# Patient Record
Sex: Male | Born: 1955
Health system: Southern US, Community
[De-identification: ages and names within clinical notes are randomized; demographics above are authoritative.]

## PROBLEM LIST (undated history)

## (undated) DIAGNOSIS — J189 Pneumonia, unspecified organism: Secondary | ICD-10-CM

## (undated) DIAGNOSIS — F32A Depression, unspecified: Secondary | ICD-10-CM

## (undated) DIAGNOSIS — E785 Hyperlipidemia, unspecified: Secondary | ICD-10-CM

## (undated) DIAGNOSIS — C61 Malignant neoplasm of prostate: Secondary | ICD-10-CM

## (undated) DIAGNOSIS — K219 Gastro-esophageal reflux disease without esophagitis: Secondary | ICD-10-CM

## (undated) DIAGNOSIS — K317 Polyp of stomach and duodenum: Secondary | ICD-10-CM

## (undated) DIAGNOSIS — K7460 Unspecified cirrhosis of liver: Secondary | ICD-10-CM

## (undated) DIAGNOSIS — K754 Autoimmune hepatitis: Secondary | ICD-10-CM

## (undated) DIAGNOSIS — K8020 Calculus of gallbladder without cholecystitis without obstruction: Secondary | ICD-10-CM

## (undated) DIAGNOSIS — F329 Major depressive disorder, single episode, unspecified: Secondary | ICD-10-CM

## (undated) DIAGNOSIS — Z973 Presence of spectacles and contact lenses: Secondary | ICD-10-CM

## (undated) DIAGNOSIS — Z8719 Personal history of other diseases of the digestive system: Secondary | ICD-10-CM

## (undated) DIAGNOSIS — I319 Disease of pericardium, unspecified: Secondary | ICD-10-CM

## (undated) DIAGNOSIS — K635 Polyp of colon: Secondary | ICD-10-CM

## (undated) HISTORY — PX: MOUTH SURGERY: SHX715

## (undated) HISTORY — DX: Autoimmune hepatitis: K75.4

## (undated) HISTORY — PX: TONSILLECTOMY AND ADENOIDECTOMY: SUR1326

## (undated) HISTORY — DX: Polyp of colon: K63.5

## (undated) HISTORY — DX: Unspecified cirrhosis of liver: K74.60

## (undated) HISTORY — PX: VASECTOMY: SHX75

## (undated) HISTORY — DX: Hyperlipidemia, unspecified: E78.5

## (undated) HISTORY — PX: SHOULDER ARTHROSCOPY: SHX128

## (undated) HISTORY — DX: Calculus of gallbladder without cholecystitis without obstruction: K80.20

## (undated) HISTORY — DX: Gastro-esophageal reflux disease without esophagitis: K21.9

## (undated) HISTORY — PX: OTHER SURGICAL HISTORY: SHX169

## (undated) HISTORY — DX: Polyp of stomach and duodenum: K31.7

## (undated) HISTORY — PX: POLYPECTOMY: SHX149

---
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
## 2005-12-07 ENCOUNTER — Encounter: Admission: RE | Admit: 2005-12-07 | Discharge: 2005-12-07 | Payer: Self-pay | Admitting: Family Medicine

## 2005-12-07 IMAGING — US US AORTA
1 series · 14 of 25 positions shown · non-contrast
Comparison: None.

CLINICAL DATA: Upper abdominal pain on physical exam.  Question AAA.
AORTA/RETROPERITONEAL ULTRASOUND:
TECHNIQUE: Complete retroperitoneal ultrasound examination was performed, including evaluation of the abdominal aorta, IVC, common iliac vessel origins, and kidneys.

[Series 1: unknown · 0.24mm/px · 14 of 39 slices shown]
[im 1/39]
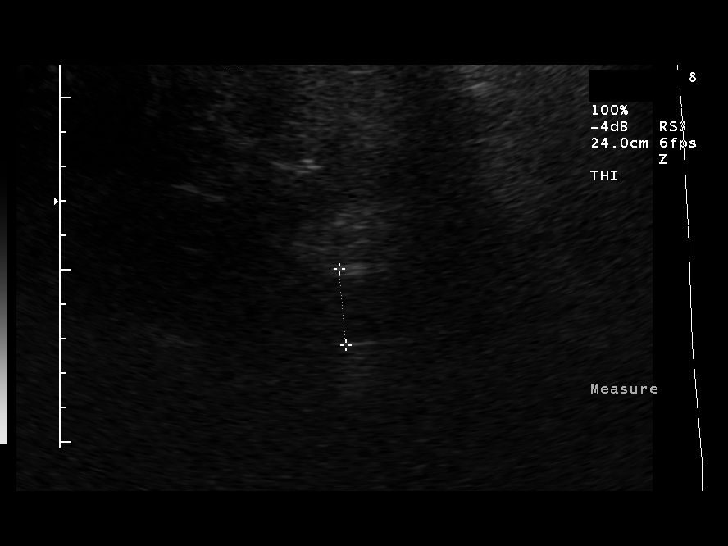
[im 4/39]
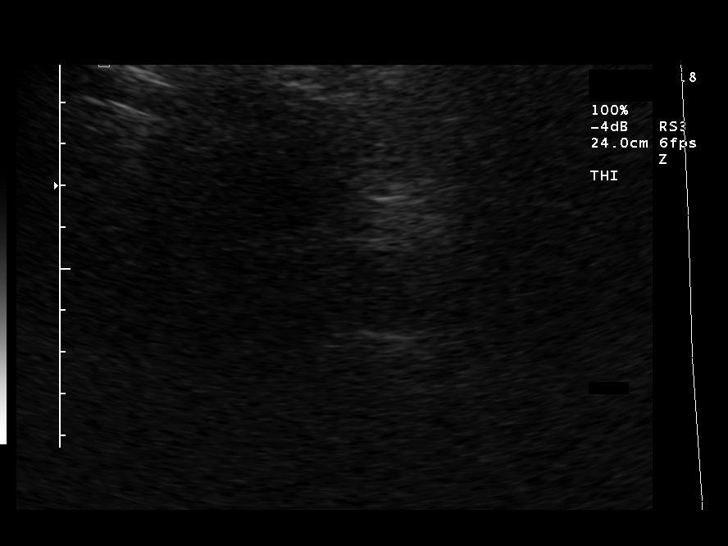
[im 7/39]
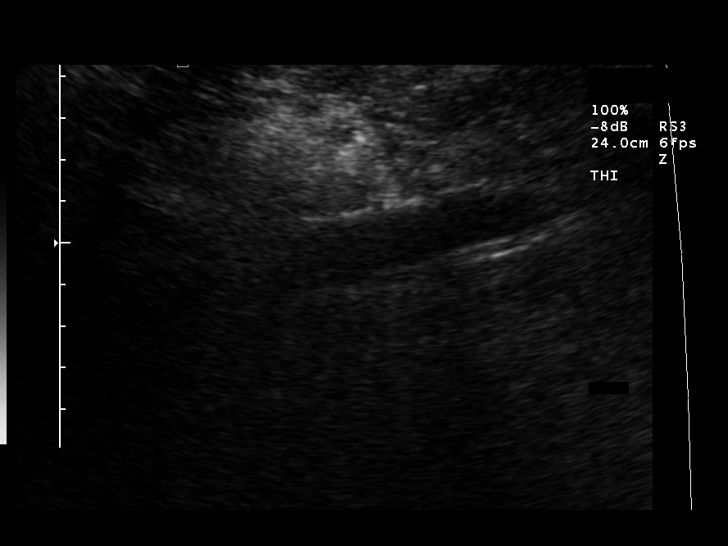
[im 10/39]
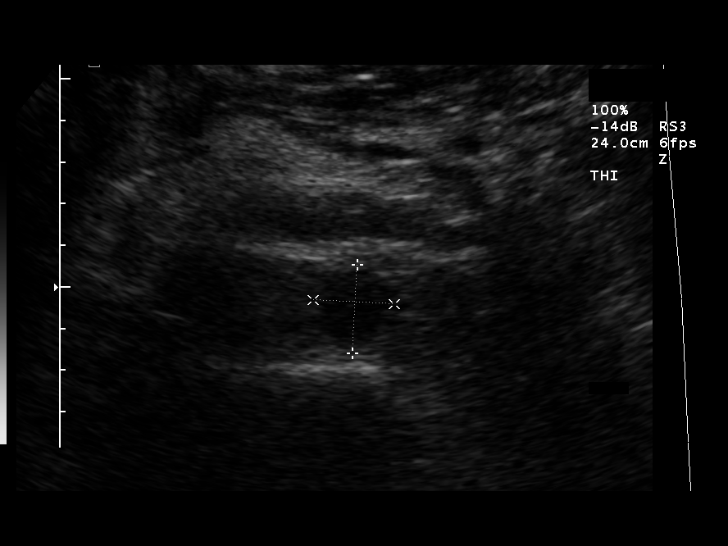
[im 13/39]
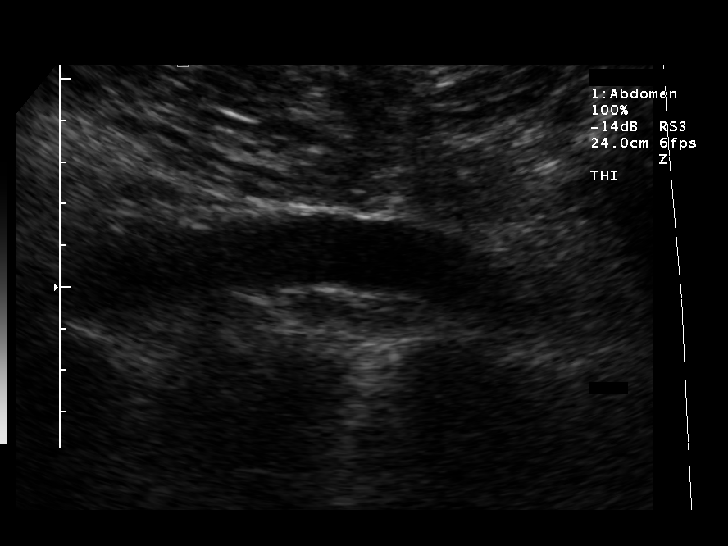
[im 15/39]
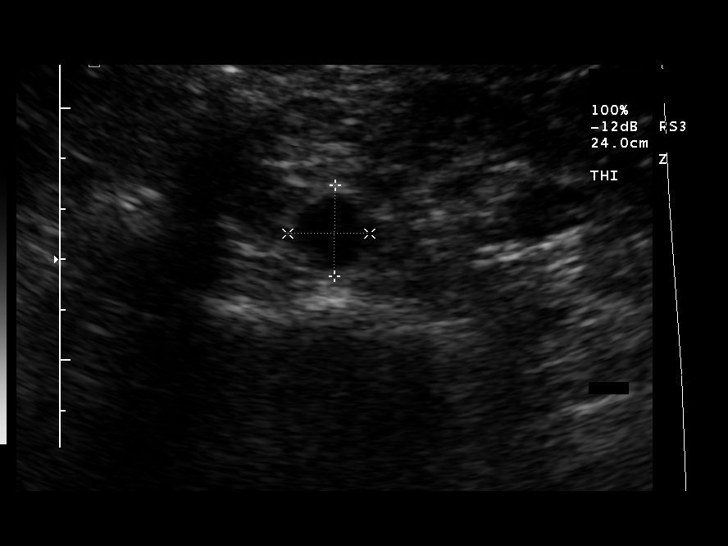
[im 18/39]
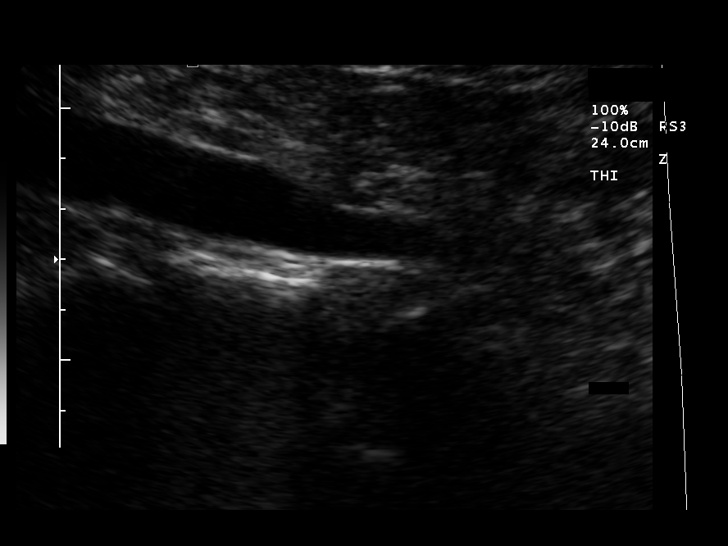
[im 21/39]
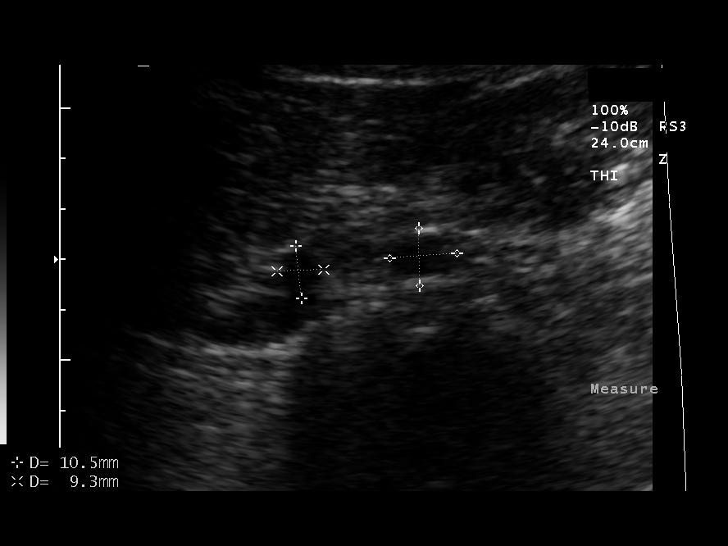
[im 24/39]
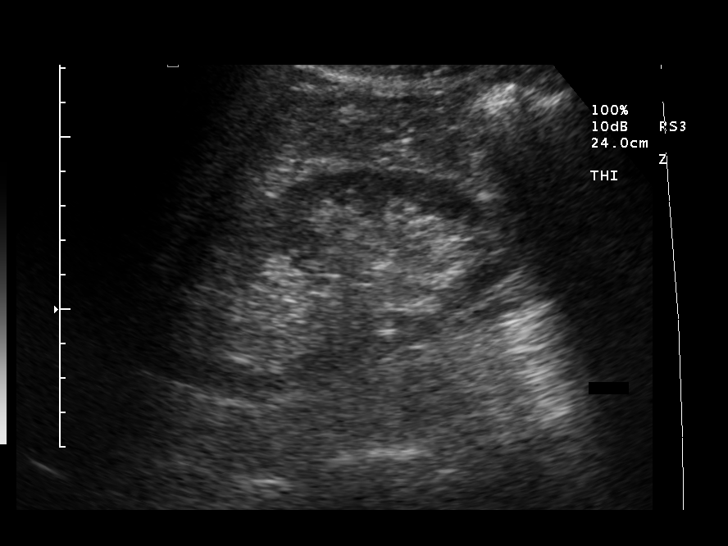
[im 26/39]
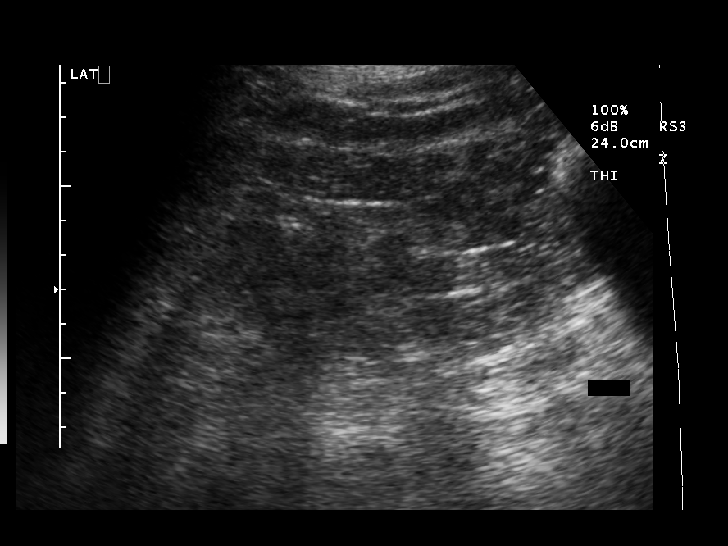
[im 29/39]
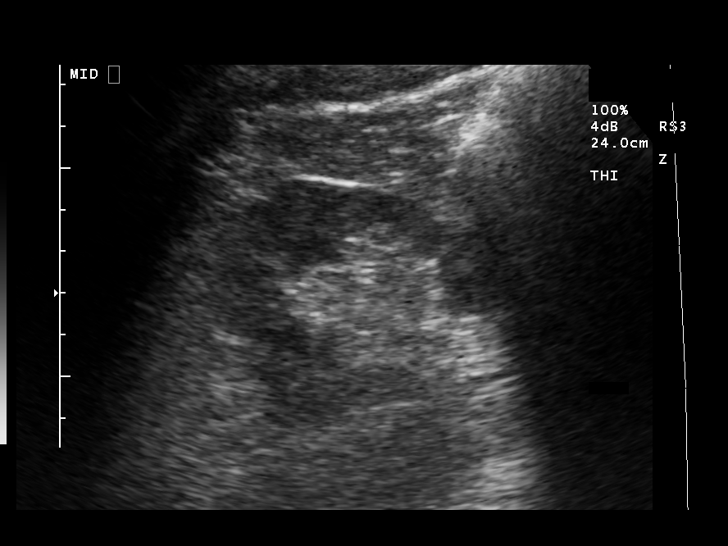
[im 32/39]
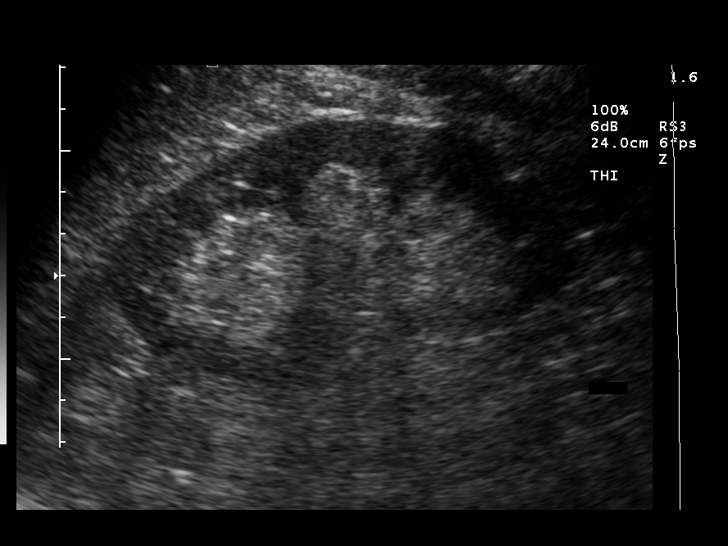
[im 35/39]
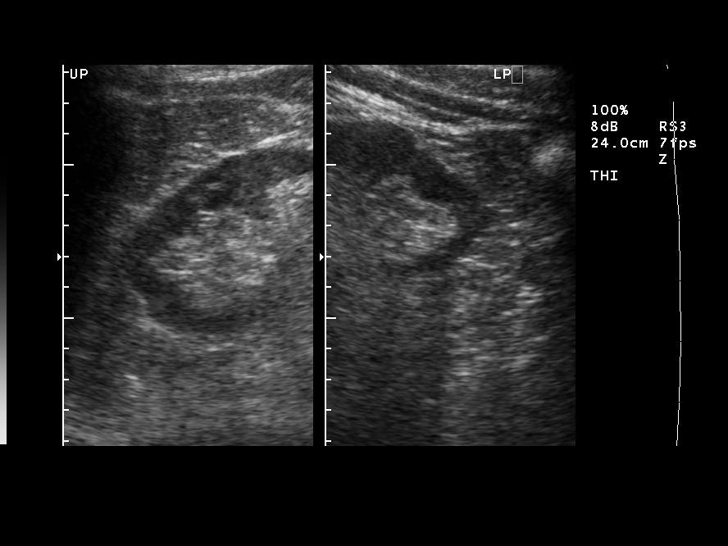
[im 39/39]
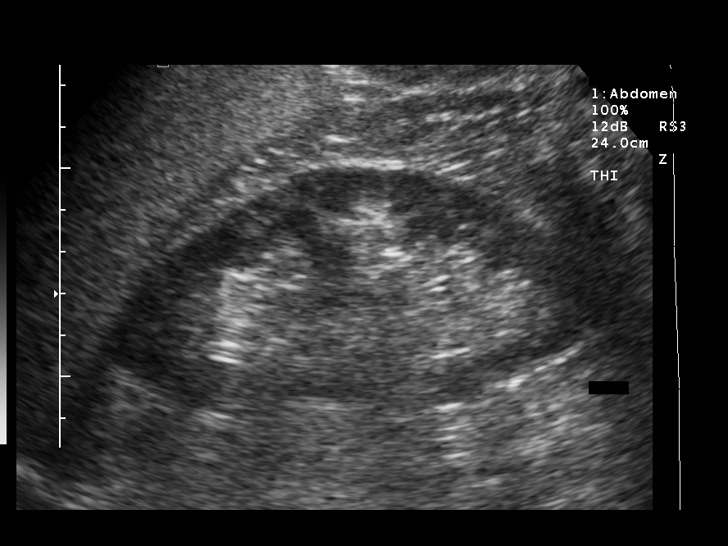

[14 of 25 positions shown; findings below may reference images not displayed]

FINDINGS: Study is limited due to overlying gastrointestinal gas.  Maximum AP diameter of visualized abdominal aorta is 2.2 cm AP by 2.2 cm wide with no aneurysm seen.  Bilateral common iliac arteries measure normally with the right 1 cm, left 1.1 cm.  Inferior vena cava and bilateral kidneys are sonographically normal with the right kidney measuring 12.1 cm long and the left kidney 11 cm long.
IMPRESSION: 1.  Slight limitations due to overlying gastrointestinal gas.
2.  No evidence for abdominal aortic aneurysm.
3.  Otherwise negative.

## 2008-01-19 ENCOUNTER — Ambulatory Visit: Payer: Self-pay | Admitting: Gastroenterology

## 2008-02-03 ENCOUNTER — Encounter: Payer: Self-pay | Admitting: Gastroenterology

## 2008-02-03 ENCOUNTER — Ambulatory Visit: Payer: Self-pay | Admitting: Gastroenterology

## 2008-02-03 DIAGNOSIS — D126 Benign neoplasm of colon, unspecified: Secondary | ICD-10-CM | POA: Insufficient documentation

## 2008-02-23 ENCOUNTER — Ambulatory Visit: Payer: Self-pay | Admitting: Family Medicine

## 2010-12-31 DIAGNOSIS — J189 Pneumonia, unspecified organism: Secondary | ICD-10-CM

## 2010-12-31 HISTORY — DX: Pneumonia, unspecified organism: J18.9

## 2010-12-31 HISTORY — PX: SHOULDER ARTHROSCOPY: SHX128

## 2012-12-30 ENCOUNTER — Other Ambulatory Visit (HOSPITAL_COMMUNITY): Payer: Self-pay | Admitting: Psychiatry

## 2013-01-19 ENCOUNTER — Encounter: Payer: Self-pay | Admitting: Gastroenterology

## 2013-03-04 ENCOUNTER — Other Ambulatory Visit (HOSPITAL_COMMUNITY): Payer: Self-pay | Admitting: Psychiatry

## 2013-12-28 ENCOUNTER — Other Ambulatory Visit: Payer: Self-pay | Admitting: Family Medicine

## 2013-12-28 DIAGNOSIS — R7989 Other specified abnormal findings of blood chemistry: Secondary | ICD-10-CM

## 2014-01-04 ENCOUNTER — Ambulatory Visit
Admission: RE | Admit: 2014-01-04 | Discharge: 2014-01-04 | Disposition: A | Discharge status: Home / Self Care | Payer: BC Managed Care – PPO | Source: Ambulatory Visit | Attending: Family Medicine | Admitting: Family Medicine

## 2014-01-04 DIAGNOSIS — R7989 Other specified abnormal findings of blood chemistry: Secondary | ICD-10-CM

## 2014-01-04 DIAGNOSIS — R945 Abnormal results of liver function studies: Principal | ICD-10-CM

## 2014-01-04 IMAGING — US US ABDOMEN COMPLETE
1 series · 14 of 25 positions shown · non-contrast
Comparison: Ultrasound aorta [DATE].

CLINICAL DATA: Elevated liver function tests.

EXAM:
ULTRASOUND ABDOMEN COMPLETE

[Series 1: us abdomen complete · 0.41mm/px · 14 of 84 slices shown]
[im 1/84]
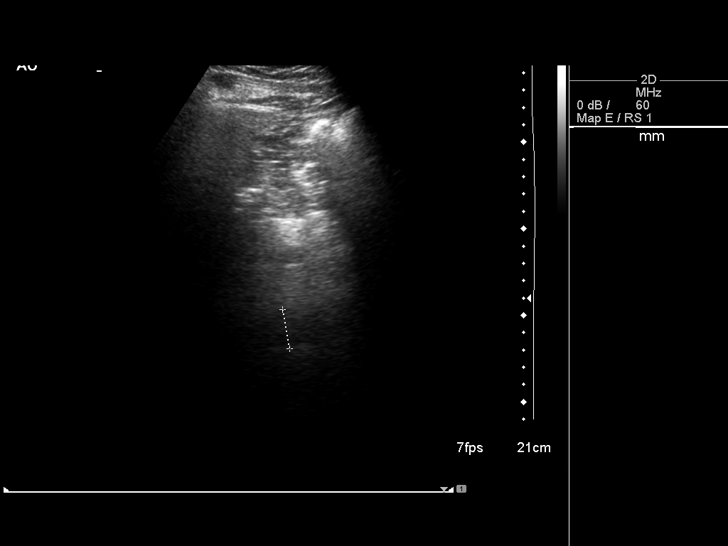
[im 7/84]
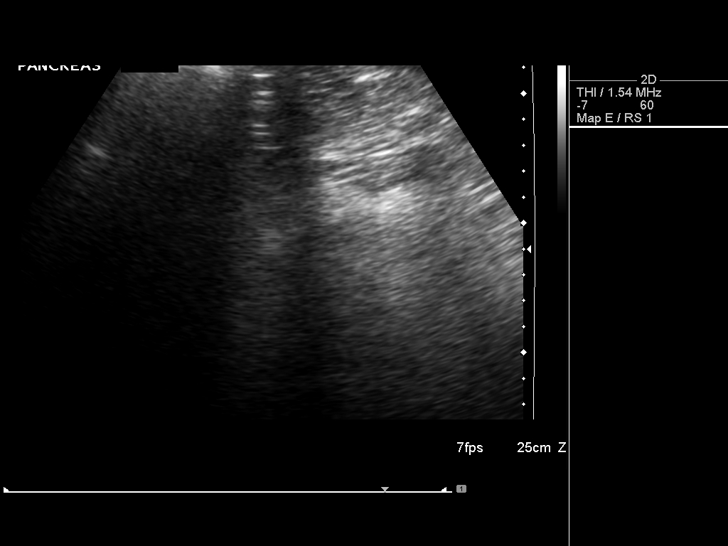
[im 14/84]
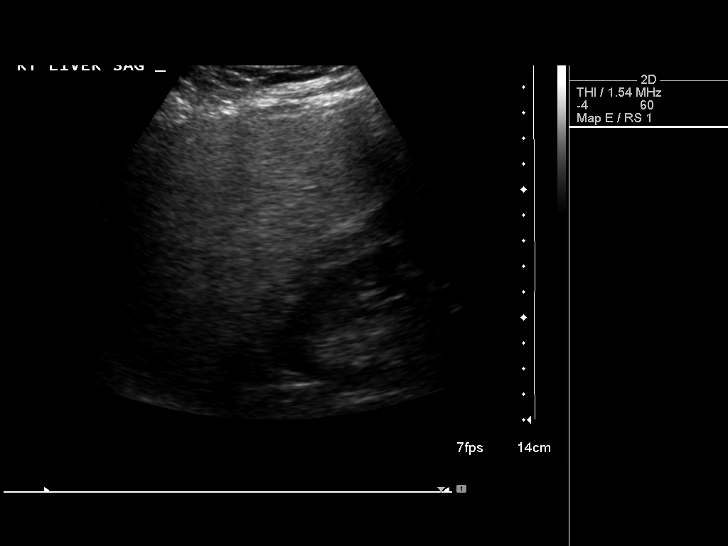
[im 21/84]
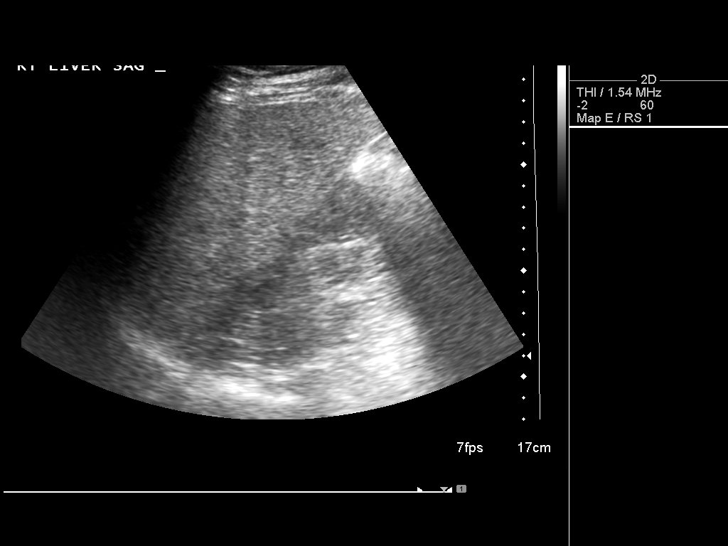
[im 28/84]
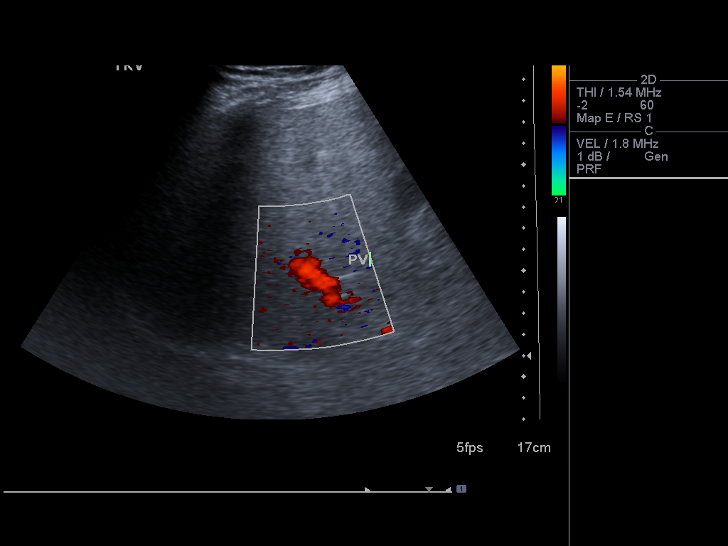
[im 32/84]
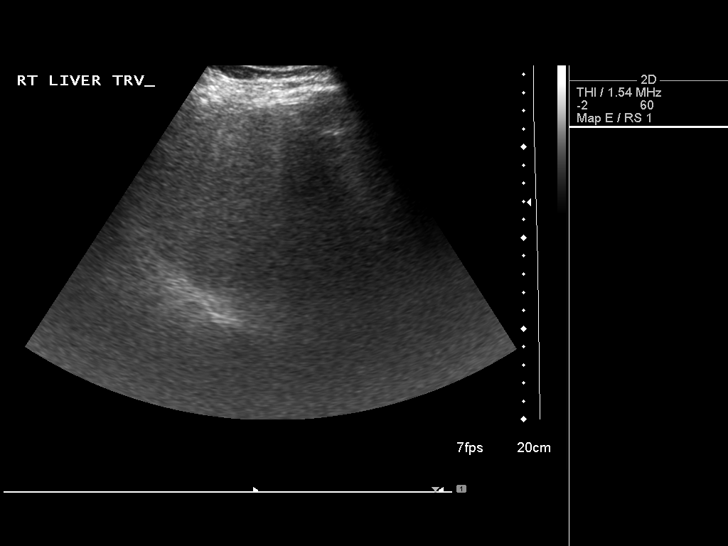
[im 39/84]
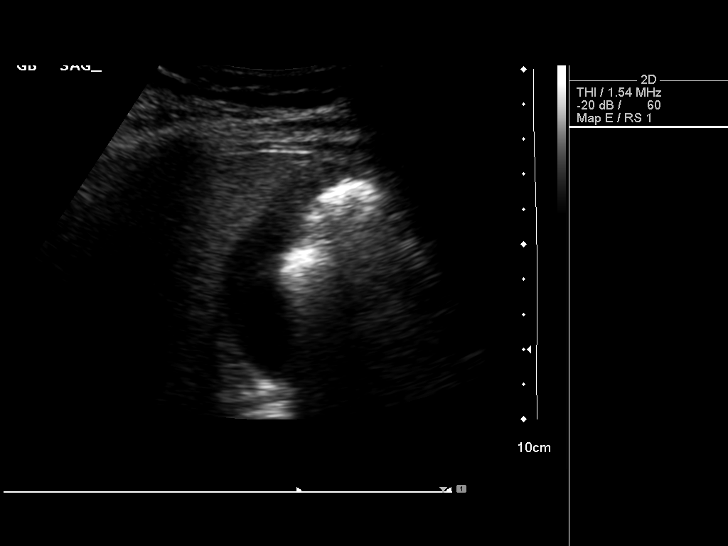
[im 45/84]
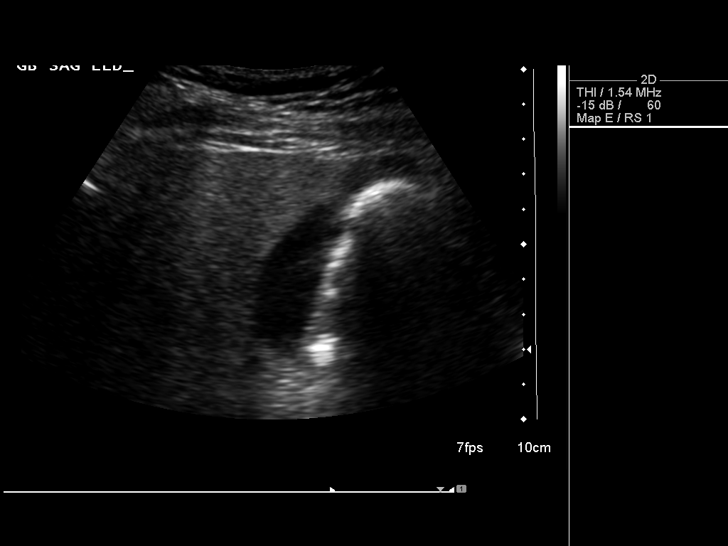
[im 52/84]
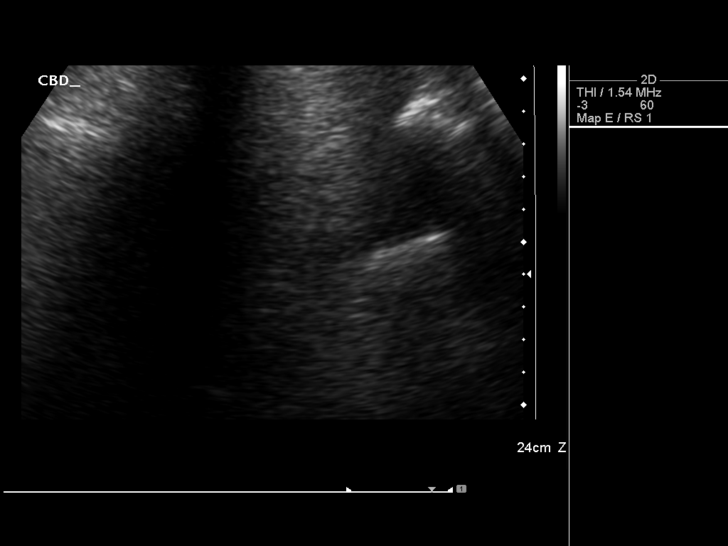
[im 56/84]
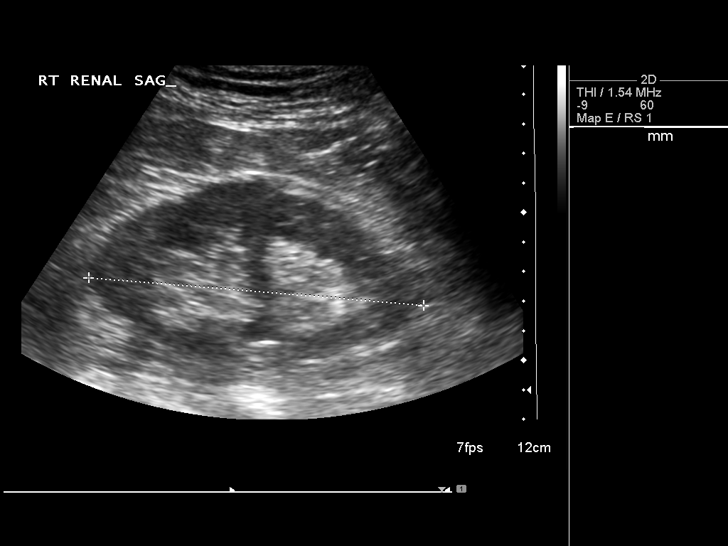
[im 63/84]
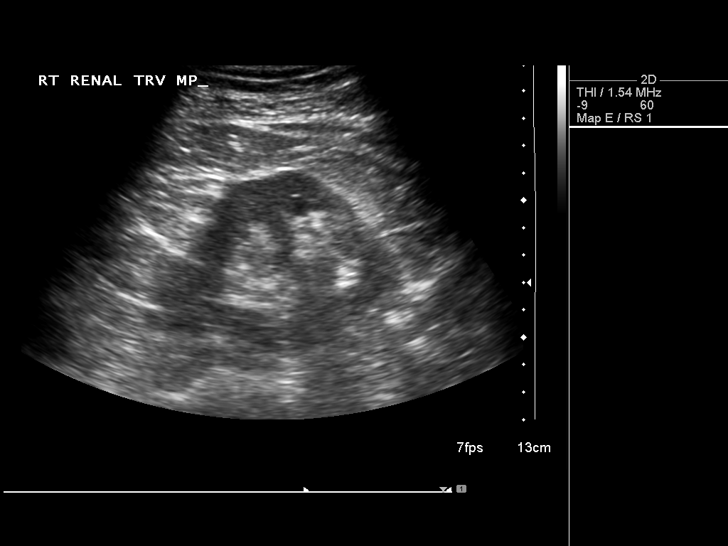
[im 70/84]
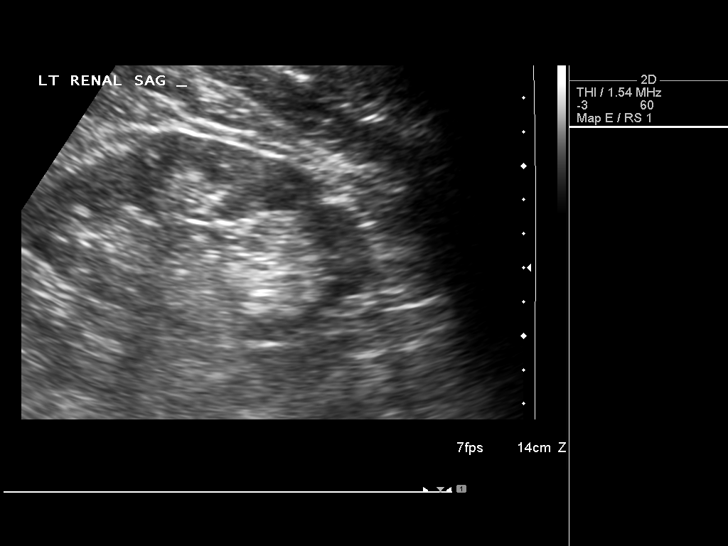
[im 77/84]
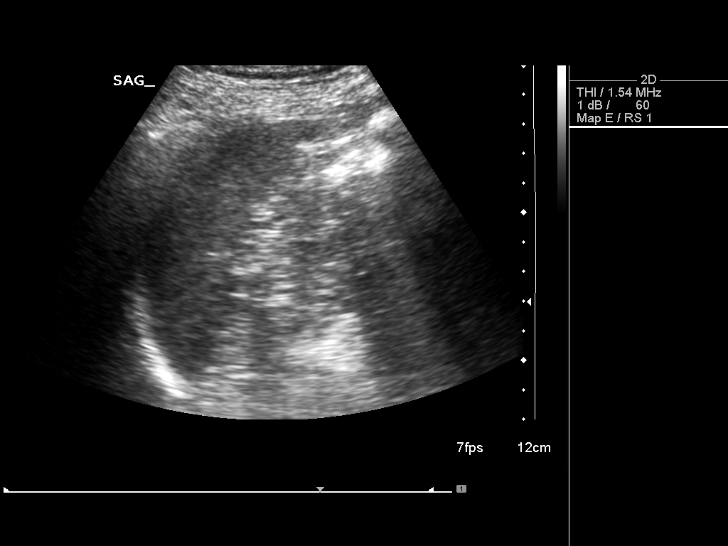
[im 84/84]
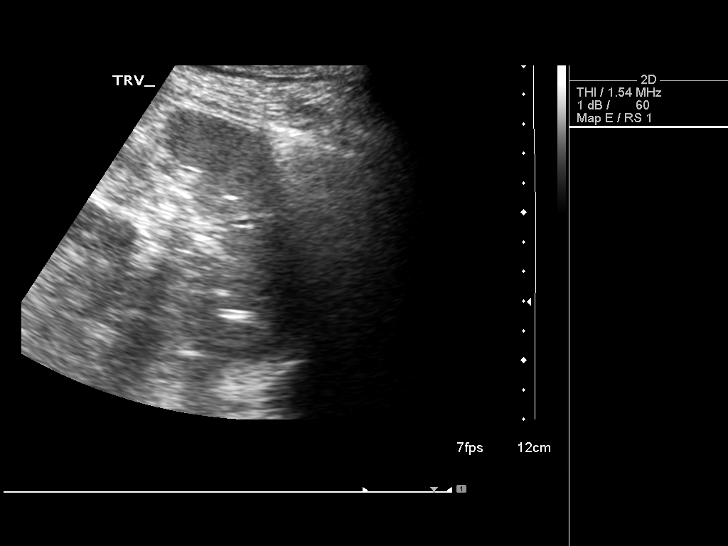

[14 of 25 positions shown; findings below may reference images not displayed]

FINDINGS: Gallbladder:

No gallstones or wall thickening visualized. No sonographic Murphy
sign noted.

Common bile duct:

Diameter: 3 mm, within normal limits.

Liver:

Diffusely increased in echogenicity.  No definite focal lesion.

IVC:

Visualization is limited by bowel gas.

Pancreas:

Obscured by bowel gas.

Spleen:

Measures 4.6 cm, negative.

Right Kidney:

Length: 11.4 cm. There may be mild cortical thinning. Parenchymal
echogenicity is within normal limits. A 1.2 cm anechoic lesion with
increased through transmission is consistent with a cyst, in the
interpolar region.

Left Kidney:

Length: 11.5 cm. Cortical thinning. Parenchymal echogenicity is
within normal limits. No hydronephrosis. No focal lesion.

Abdominal aorta:

No aneurysm visualized.

Other findings:

None.
IMPRESSION: 1. Hepatic steatosis.
2. Mild bilateral renal cortical thinning.

## 2014-11-08 ENCOUNTER — Ambulatory Visit
Admission: RE | Admit: 2014-11-08 | Discharge: 2014-11-08 | Disposition: A | Discharge status: Home / Self Care | Payer: BC Managed Care – PPO | Source: Ambulatory Visit | Attending: Family Medicine | Admitting: Family Medicine

## 2014-11-08 ENCOUNTER — Other Ambulatory Visit: Payer: Self-pay | Admitting: Family Medicine

## 2014-11-08 DIAGNOSIS — R05 Cough: Secondary | ICD-10-CM

## 2014-11-08 DIAGNOSIS — R059 Cough, unspecified: Secondary | ICD-10-CM

## 2014-11-08 IMAGING — CR DG CHEST 2V
2 series · 2 of 2 positions shown · non-contrast
Comparison: None.

CLINICAL DATA: Three-week history of cough, shortness of breath,
and bilateral lower rib discomfort and mid chest pleuritic pain ; no
history of tobacco use

EXAM:
CHEST  2 VIEW

[w chest pa]
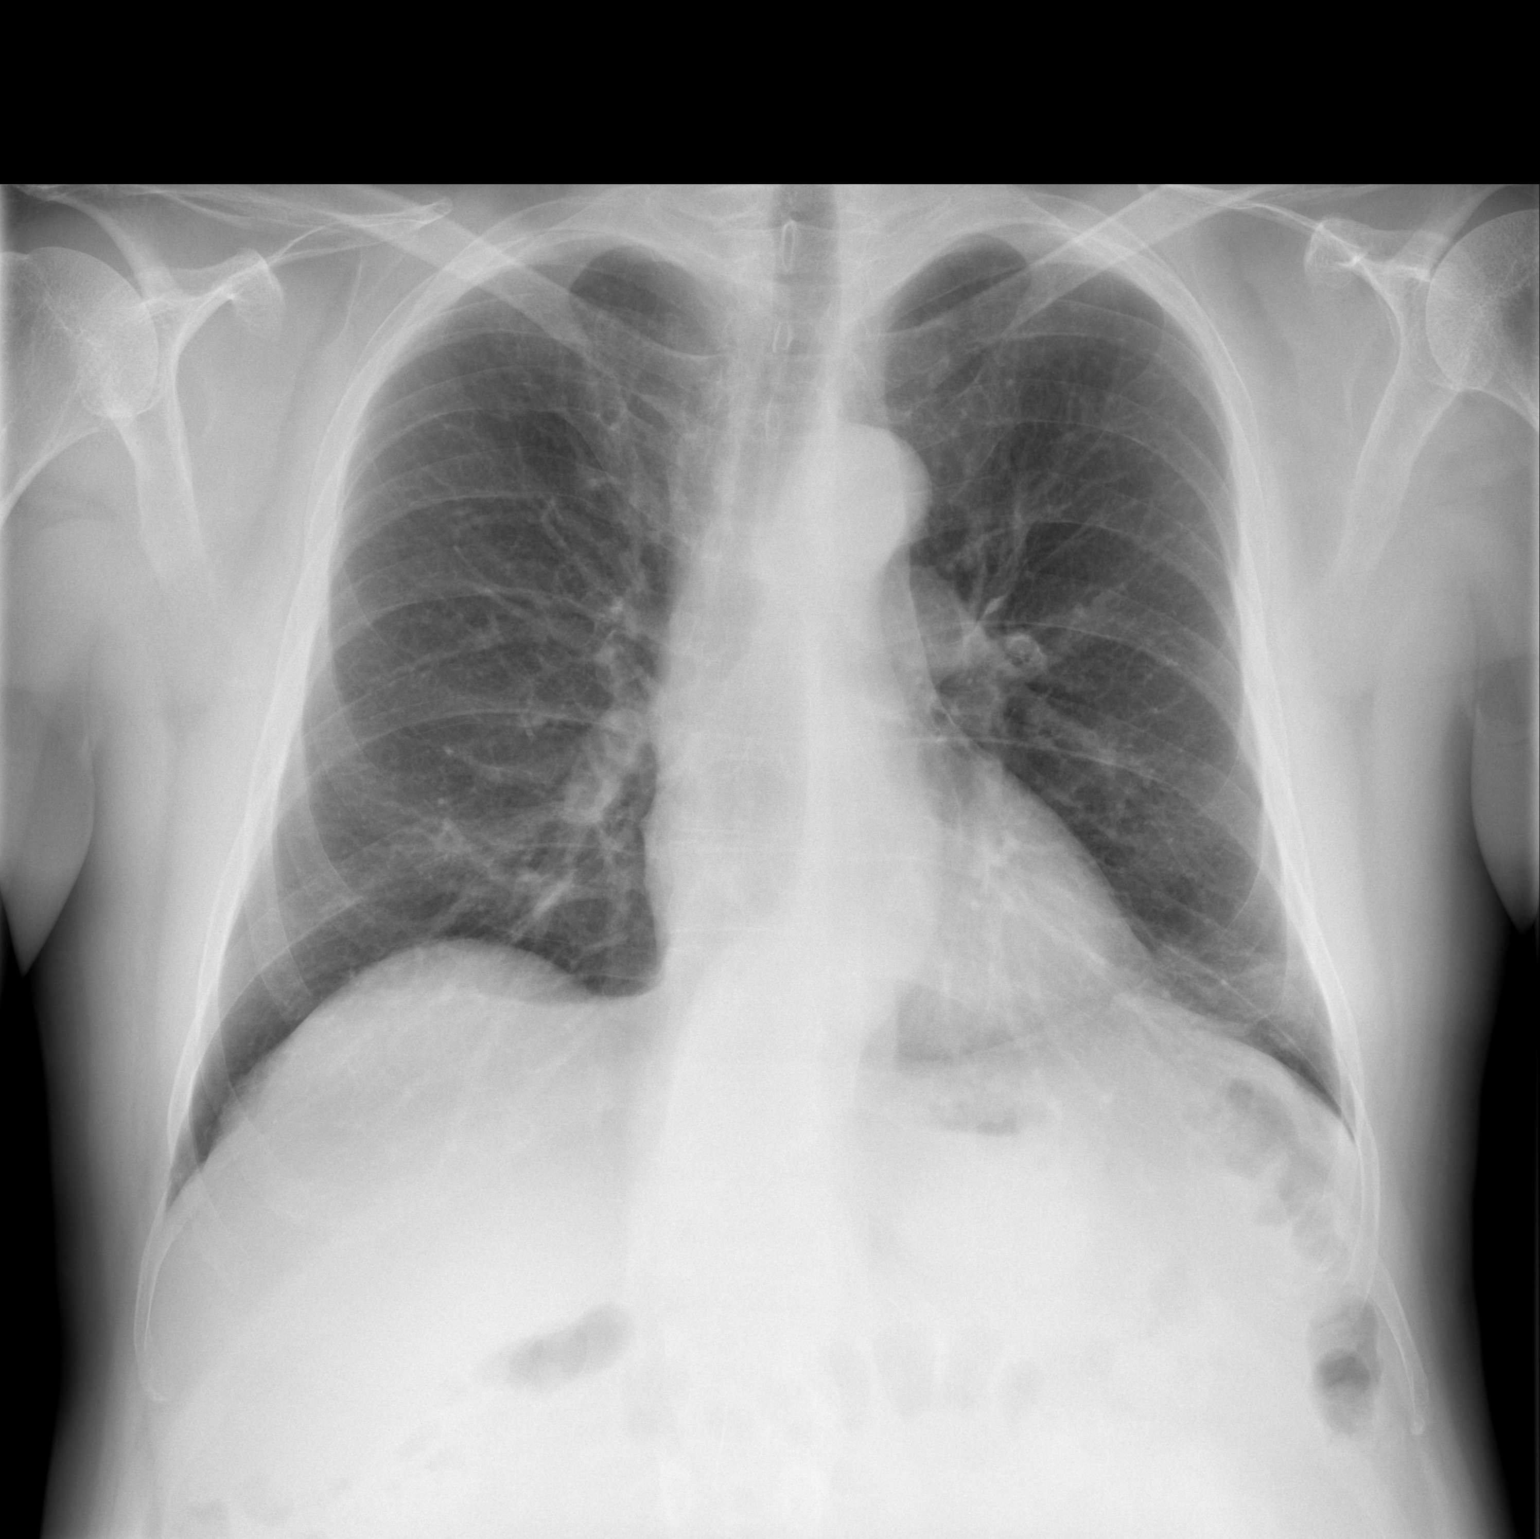

[w chest lat]
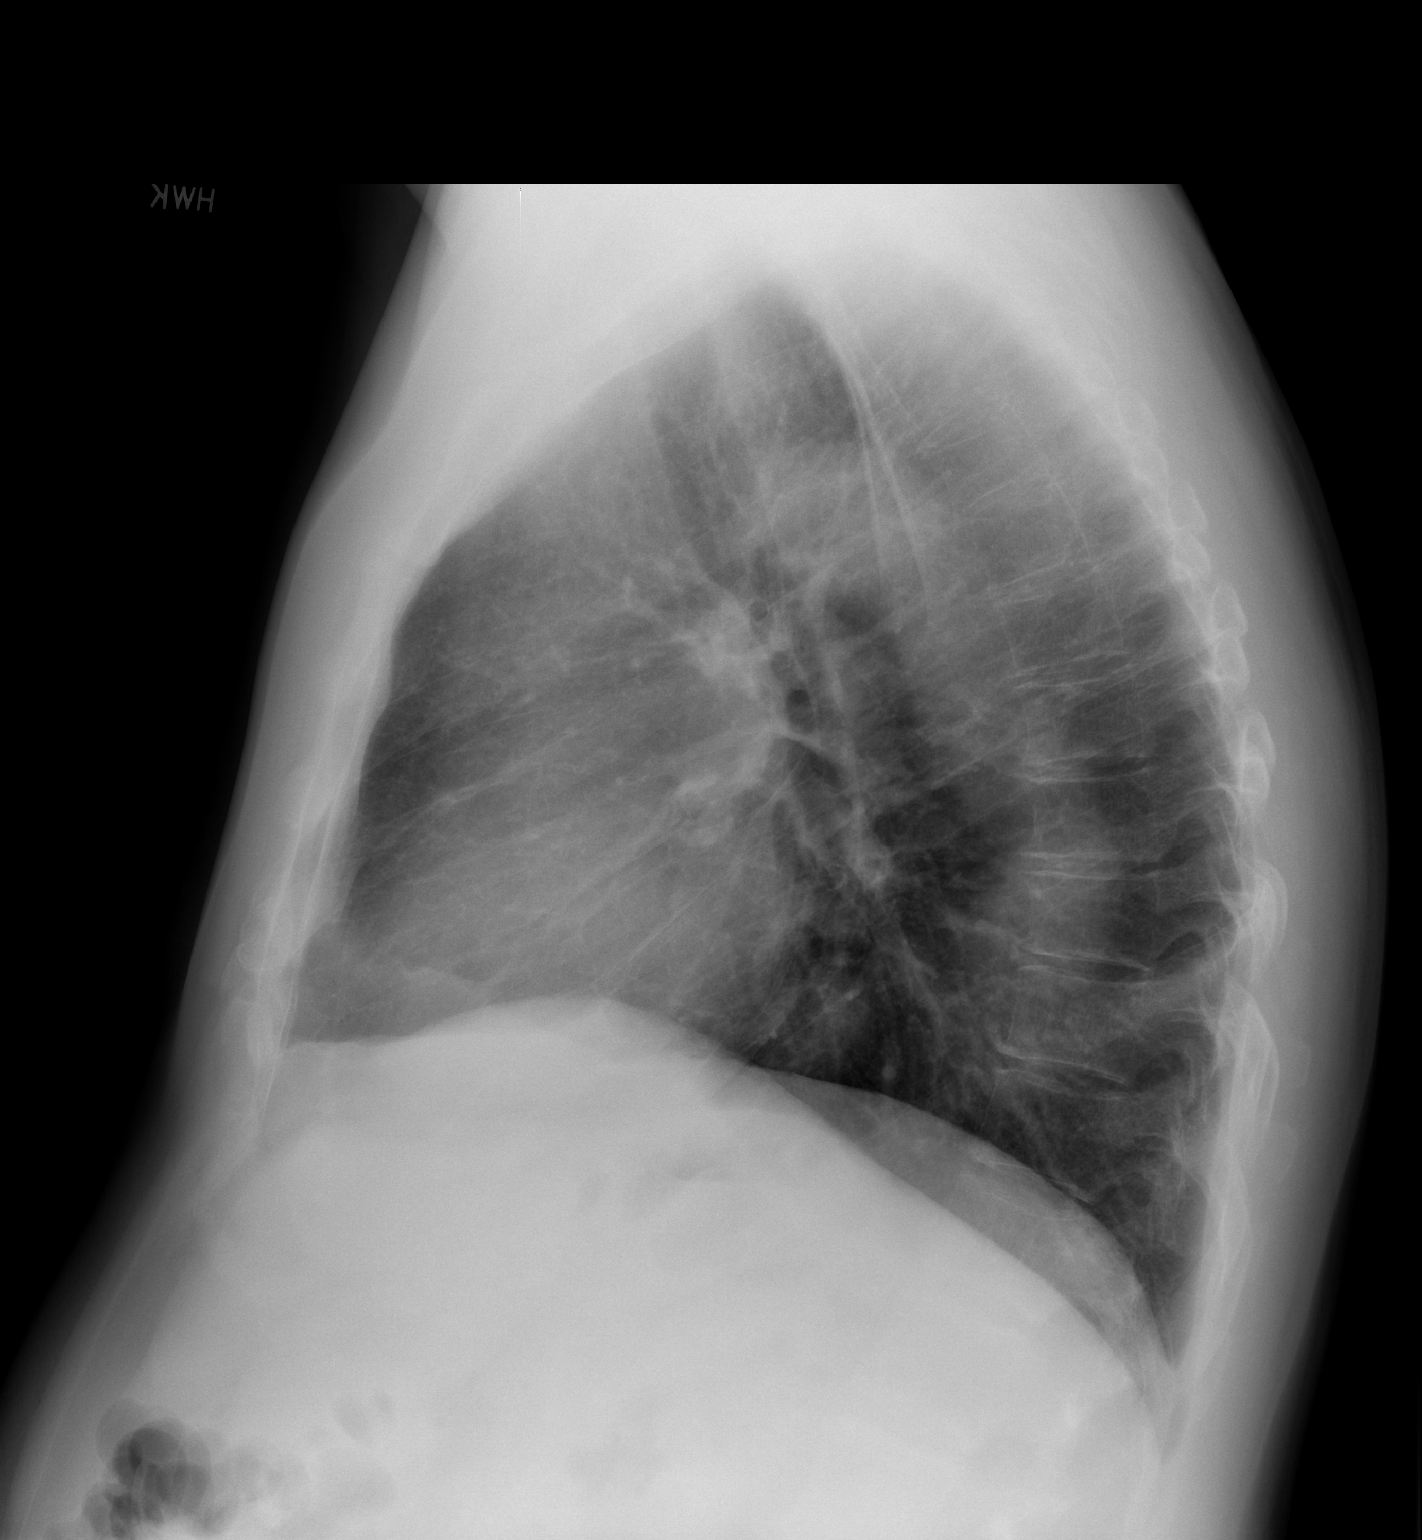

[2 of 2 positions shown; findings below may reference images not displayed]

FINDINGS: The lungs are reasonably well inflated. There are coarse lung
markings just above the left hemidiaphragm. There is no alveolar
pneumonia. The heart and pulmonary vascularity are within the limits
of normal. There is tortuosity of the descending thoracic aorta. The
mediastinum is normal in width. There is no pleural effusion or
pneumothorax. The bony thorax is unremarkable.
IMPRESSION: There is minimal left basilar subsegmental atelectasis. There is no
evidence of pneumonia nor CHF.

## 2014-12-31 HISTORY — PX: POLYPECTOMY: SHX149

## 2015-08-24 ENCOUNTER — Encounter: Payer: Self-pay | Admitting: Gastroenterology

## 2015-10-31 ENCOUNTER — Encounter: Payer: BC Managed Care – PPO | Admitting: Gastroenterology

## 2015-11-30 ENCOUNTER — Ambulatory Visit (AMBULATORY_SURGERY_CENTER): Payer: Self-pay

## 2015-11-30 VITALS — Ht 71.0 in | Wt 203.6 lb

## 2015-11-30 DIAGNOSIS — Z860100 Personal history of colon polyps, unspecified: Secondary | ICD-10-CM

## 2015-11-30 MED ORDER — SUPREP BOWEL PREP KIT 17.5-3.13-1.6 GM/177ML PO SOLN: 1.0000 | Freq: Once | ORAL | Status: DC

## 2015-11-30 NOTE — Progress Notes (Signed)
No allergies to eggs or soy No past problems with anesthesia No home oxygen No diet/weight loss meds  Has email and internet; refused emmi 

## 2015-12-08 ENCOUNTER — Encounter: Payer: Self-pay | Admitting: Gastroenterology

## 2015-12-08 ENCOUNTER — Encounter: Payer: BC Managed Care – PPO | Admitting: Gastroenterology

## 2015-12-21 ENCOUNTER — Ambulatory Visit (AMBULATORY_SURGERY_CENTER): Payer: BC Managed Care – PPO | Admitting: Gastroenterology

## 2015-12-21 ENCOUNTER — Encounter: Payer: Self-pay | Admitting: Gastroenterology

## 2015-12-21 VITALS — BP 136/57 | HR 55 | Temp 96.4°F | Resp 14 | Ht 71.0 in | Wt 203.0 lb

## 2015-12-21 DIAGNOSIS — Z86010 Personal history of colonic polyps: Secondary | ICD-10-CM

## 2015-12-21 DIAGNOSIS — D128 Benign neoplasm of rectum: Secondary | ICD-10-CM

## 2015-12-21 DIAGNOSIS — D122 Benign neoplasm of ascending colon: Secondary | ICD-10-CM

## 2015-12-21 HISTORY — PX: COLONOSCOPY: SHX174

## 2015-12-21 MED ORDER — SODIUM CHLORIDE 0.9 % IV SOLN: 500.0000 mL | INTRAVENOUS | Status: DC

## 2015-12-21 NOTE — Patient Instructions (Signed)
NO ASPIRIN, ASPIRIN PRODUCTS OR NSAIDS FOR 2 WEEKS,JANUARY 4,2019.    YOU HAD AN ENDOSCOPIC PROCEDURE TODAY AT [facility]:   Refer to the procedure report that was given to you for any specific questions about what was found during the examination.  If the procedure report does not answer your questions, please call your gastroenterologist to clarify.  If you requested that your care partner not be given the details of your procedure findings, then the procedure report has been included in a sealed envelope for you to review at your convenience later.  YOU SHOULD EXPECT: Some feelings of bloating in the abdomen. Passage of more gas than usual.  Walking can help get rid of the air that was put into your GI tract during the procedure and reduce the bloating. If you had a lower endoscopy (such as a colonoscopy or flexible sigmoidoscopy) you may notice spotting of blood in your stool or on the toilet paper. If you underwent a bowel prep for your procedure, you may not have a normal bowel movement for a few days.  Please Note:  You might notice some irritation and congestion in your nose or some drainage.  This is from the oxygen used during your procedure.  There is no need for concern and it should clear up in a day or so.  SYMPTOMS TO REPORT IMMEDIATELY:   Following lower endoscopy (colonoscopy or flexible sigmoidoscopy):  Excessive amounts of blood in the stool  Significant tenderness or worsening of abdominal pains  Swelling of the abdomen that is new, acute  Fever of 100F or higher   For urgent or emergent issues, a gastroenterologist can be reached at any hour by calling [phone].   DIET: Your first meal following the procedure should be a small meal and then it is ok to progress to your normal diet. Heavy or fried foods are harder to digest and may make you feel nauseous or bloated.  Likewise, meals heavy in dairy and vegetables can increase bloating.  Drink plenty  of fluids but you should avoid alcoholic beverages for 24 hours.  ACTIVITY:  You should plan to take it easy for the rest of today and you should NOT DRIVE or use heavy machinery until tomorrow (because of the sedation medicines used during the test).    FOLLOW UP: Our staff will call the number listed on your records the next business day following your procedure to check on you and address any questions or concerns that you may have regarding the information given to you following your procedure. If we do not reach you, we will leave a message.  However, if you are feeling well and you are not experiencing any problems, there is no need to return our call.  We will assume that you have returned to your regular daily activities without incident.  If any biopsies were taken you will be contacted by phone or by letter within the next 1-3 weeks.  Please call us at [phone] if you have not heard about the biopsies in 3 weeks.    SIGNATURES/CONFIDENTIALITY: You and/or your care partner have signed paperwork which will be entered into your electronic medical record.  These signatures attest to the fact that that the information above on your After Visit Summary has been reviewed and is understood.  Full responsibility of the confidentiality of this discharge information lies with you and/or your care-partner.

## 2015-12-21 NOTE — Progress Notes (Signed)
To recovery, report to Karen, RN, VSS. 

## 2015-12-21 NOTE — Op Note (Signed)
[place]  [address] [place], [address]   COLONOSCOPY PROCEDURE REPORT  PATIENT: Jeffrey Hanson, Jeffrey Hanson  MR#: [id number] BIRTHDATE: 11-13-1956 , 59  yrs. old GENDER: male ENDOSCOPIST: Yetta Flock, MD REFERRED BY: PROCEDURE DATE:  12/21/2015 PROCEDURE:   Colonoscopy, surveillance and Colonoscopy with snare polypectomy First Screening Colonoscopy - Avg.  risk and is 50 yrs.  old or older - No.  Prior Negative Screening - Now for repeat screening. N/A  History of Adenoma - Now for follow-up colonoscopy & has been > or = to 3 yrs.  Yes hx of adenoma.  Has been 3 or more years since last colonoscopy.  Polyps removed today? Yes ASA CLASS:   Class II INDICATIONS:Surveillance due to prior colonic neoplasia and Colorectal Neoplasm Risk Assessment for this procedure is average risk. MEDICATIONS: Propofol 350 mg IV  DESCRIPTION OF PROCEDURE:   After the risks benefits and alternatives of the procedure were thoroughly explained, informed consent was obtained.  The digital rectal exam revealed no abnormalities of the rectum.   The LB [id number]  endoscope was introduced through the anus and advanced to the cecum, which was identified by both the appendix and ileocecal valve. No adverse events experienced.   The quality of the prep was good.  The instrument was then slowly withdrawn as the colon was fully examined. Estimated blood loss is zero unless otherwise noted in this procedure report.   COLON FINDINGS: A 5-62mm sessile polyp was noted in the ascending colon and removed with cold snare.  Another 16mm sessile polyp was noted in the ascending colon and removed with cold snare.  A 8-35mm sessile polyp was noted in the ascending colon and removed via hot snare.  A 39mm sessile rectal polyp was noted and removed via cold snare.  Mild diverticulosis was noted in the sigmoid colon. Retroflexed views revealed internal hemorrhoids. The time to cecum = 2.7  Withdrawal time = 20.3   The scope was withdrawn and the procedure completed. COMPLICATIONS: There were no immediate complications.  ENDOSCOPIC IMPRESSION: 4 colon polyps, removed as outlined above Mild diverticulosis Internal hemorrhoids  RECOMMENDATIONS: No aspirin or NSAIDs for 2 weeks post-polypectomy Resume diet Resume medications Await pathology results  eSigned:  Yetta Flock, MD 12/21/2015 8:24 AM   cc: the patient   PATIENT NAME:  Jeffrey Hanson, Jeffrey Hanson MR#: [id number]

## 2015-12-21 NOTE — Progress Notes (Signed)
Called to room to assist during endoscopic procedure.  Patient ID and intended procedure confirmed with present staff. Received instructions for my participation in the procedure from the performing physician.  

## 2015-12-22 ENCOUNTER — Telehealth: Payer: Self-pay | Admitting: *Deleted

## 2015-12-22 NOTE — Telephone Encounter (Signed)
  Follow up Call-  Call back number 12/21/2015  Post procedure Call Back phone  # [phone]  Permission to leave phone message Yes     Patient questions:  Do you have a fever, pain , or abdominal swelling? No. Pain Score  0 *  Have you tolerated food without any problems? Yes.    Have you been able to return to your normal activities? Yes.    Do you have any questions about your discharge instructions: Diet   No. Medications  No. Follow up visit  No.  Do you have questions or concerns about your Care? No.  Actions: * If pain score is 4 or above: No action needed, pain <4.

## 2015-12-28 ENCOUNTER — Encounter: Payer: Self-pay | Admitting: Gastroenterology

## 2016-01-01 DIAGNOSIS — I319 Disease of pericardium, unspecified: Secondary | ICD-10-CM

## 2016-01-01 HISTORY — DX: Disease of pericardium, unspecified: I31.9

## 2017-09-26 ENCOUNTER — Ambulatory Visit
Admission: RE | Admit: 2017-09-26 | Discharge: 2017-09-26 | Disposition: A | Discharge status: Home / Self Care | Payer: BC Managed Care – PPO | Source: Ambulatory Visit | Attending: Family Medicine | Admitting: Family Medicine

## 2017-09-26 ENCOUNTER — Other Ambulatory Visit: Payer: Self-pay | Admitting: Family Medicine

## 2017-09-26 DIAGNOSIS — R0781 Pleurodynia: Secondary | ICD-10-CM

## 2017-09-26 IMAGING — CR DG CHEST 2V
2 series · 2 of 2 positions shown · non-contrast
Comparison: [DATE]

CLINICAL DATA: Mid chest pain With left shoulder pain x 4 days,
pain increases with inspiration , SOB, fever x 3 days ago , there
are no other chest complaints

EXAM:
CHEST  2 VIEW

[w chest pa]
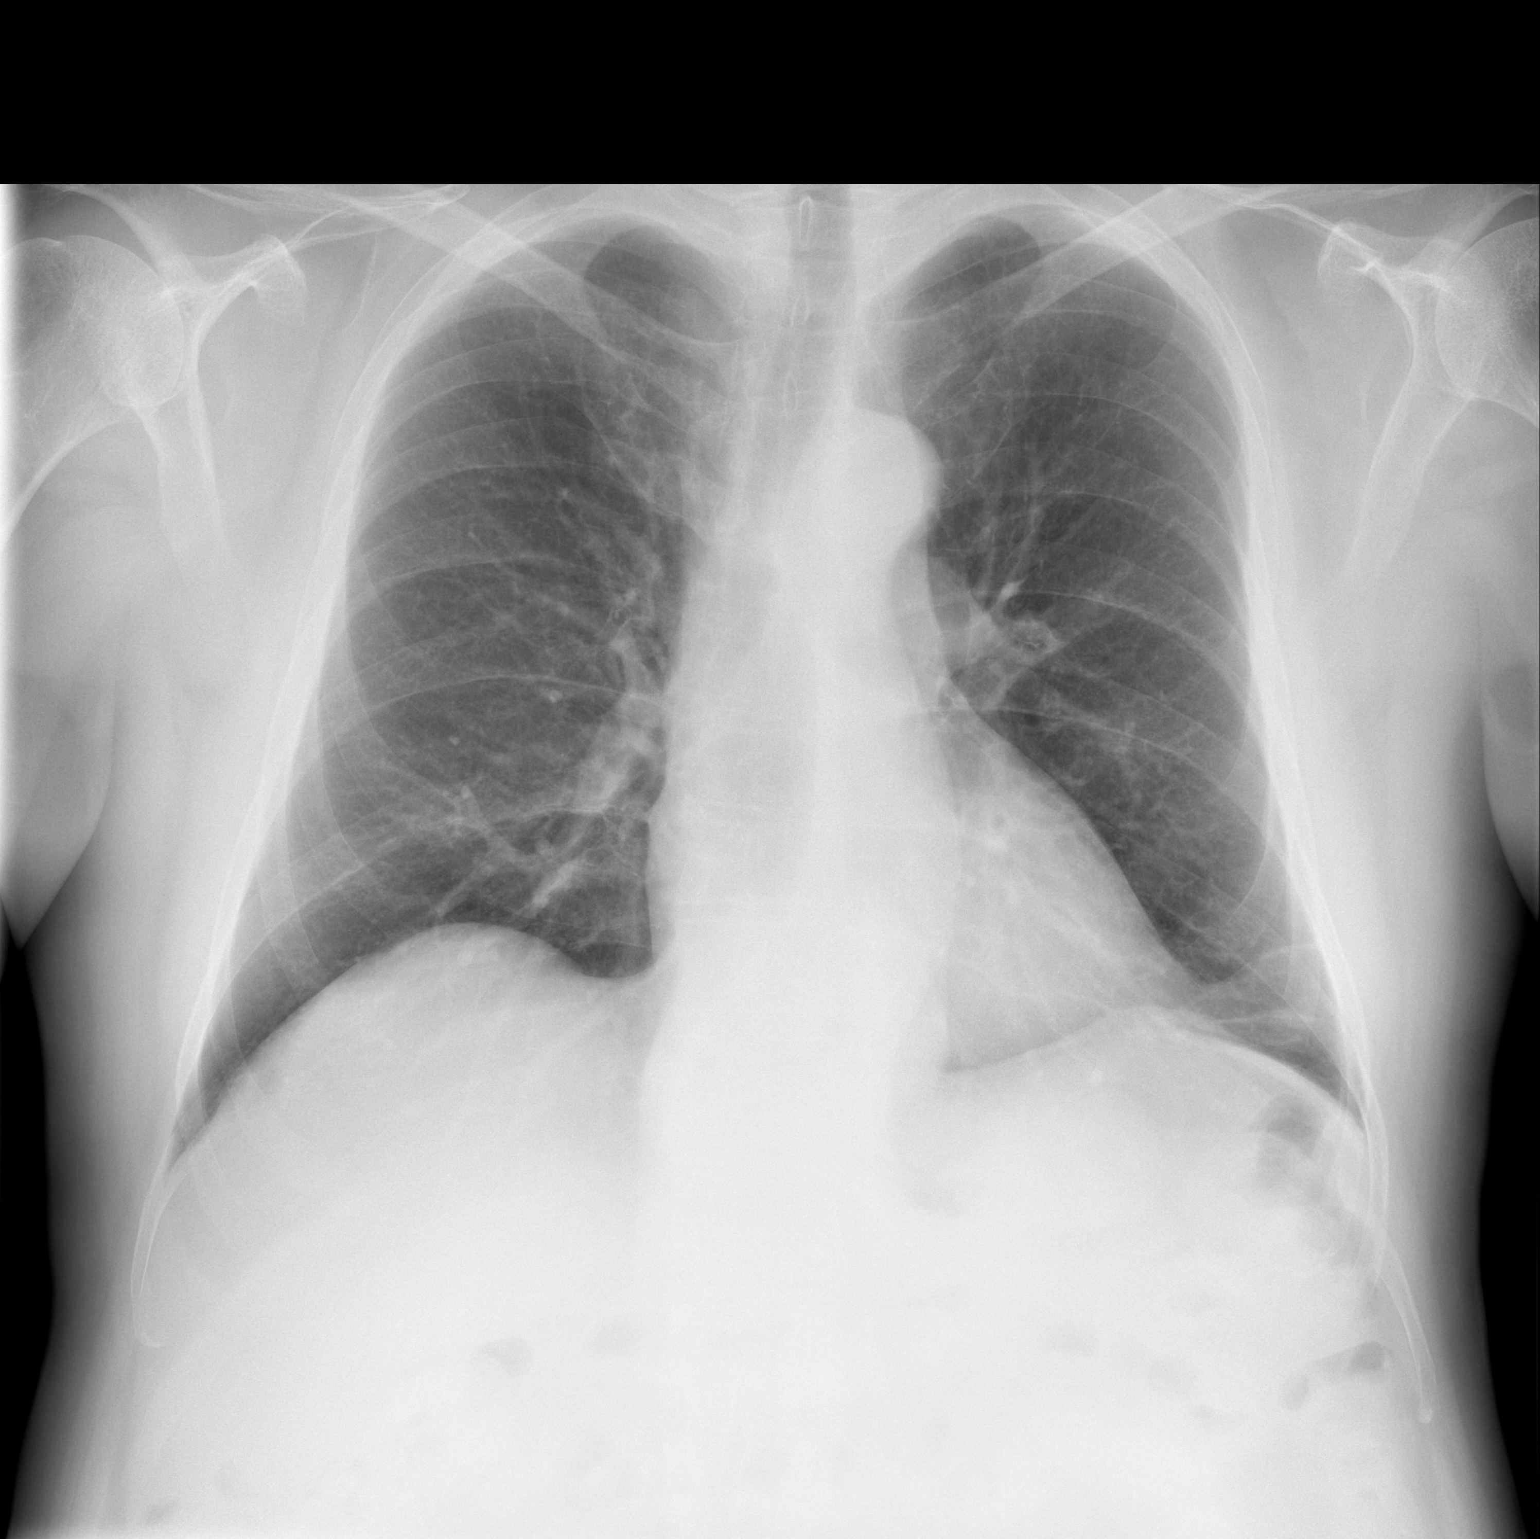

[w chest lat]
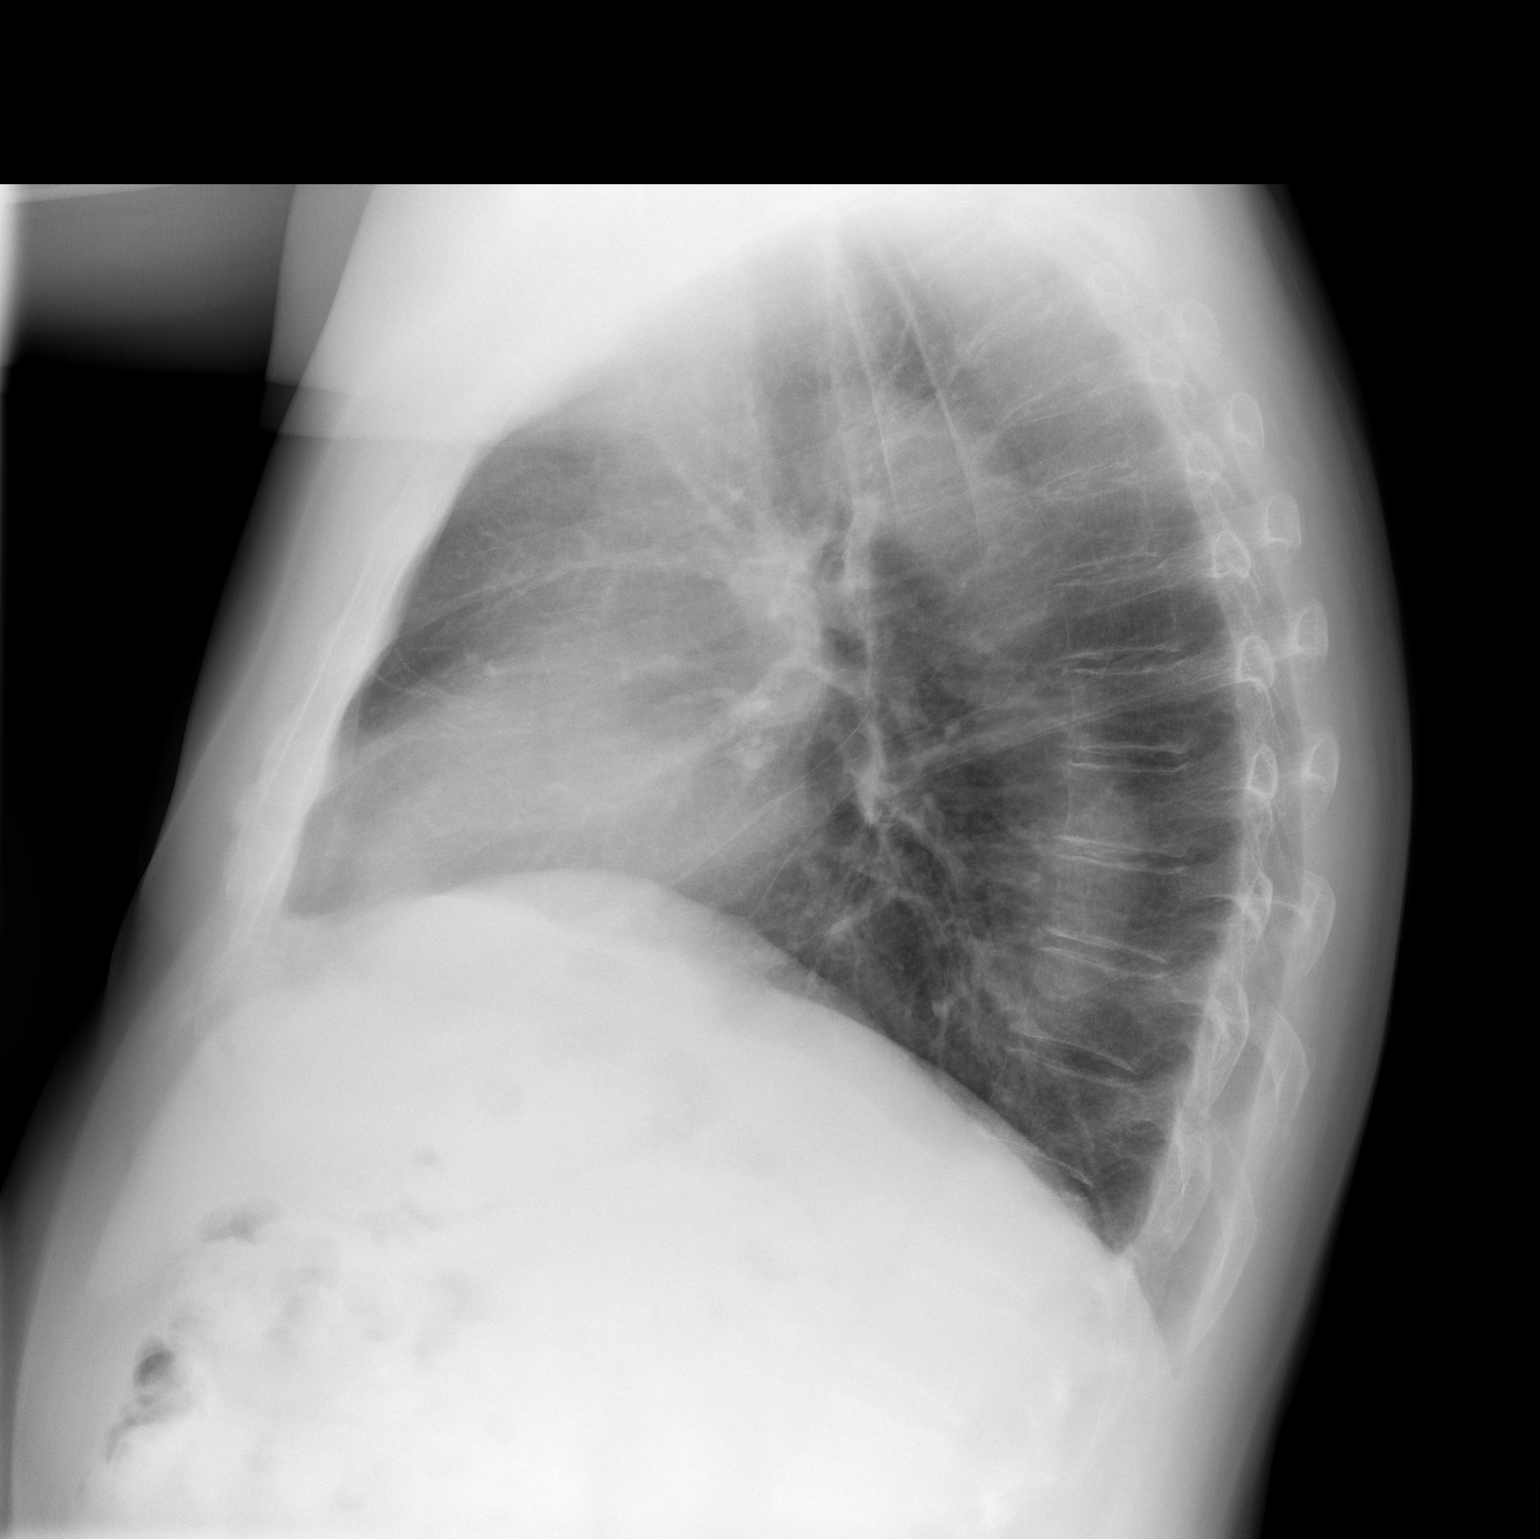

[2 of 2 positions shown; findings below may reference images not displayed]

FINDINGS: Heart normal in size and configuration. No mediastinal or hilar
masses or evidence of adenopathy. Small hiatal hernia.

Mild linear scarring at the left lateral lung base, stable. Lungs
otherwise clear.

No pleural effusion or pneumothorax.

Skeletal structures are demineralized but intact.
IMPRESSION: No active cardiopulmonary disease.

## 2017-11-20 ENCOUNTER — Ambulatory Visit
Admission: RE | Admit: 2017-11-20 | Discharge: 2017-11-20 | Disposition: A | Discharge status: Home / Self Care | Payer: BC Managed Care – PPO | Source: Ambulatory Visit | Attending: Family Medicine | Admitting: Family Medicine

## 2017-11-20 ENCOUNTER — Other Ambulatory Visit: Payer: Self-pay | Admitting: Family Medicine

## 2017-11-20 DIAGNOSIS — R0781 Pleurodynia: Secondary | ICD-10-CM

## 2017-11-20 MED ORDER — IOPAMIDOL (ISOVUE-300) INJECTION 61%
75.0000 mL | Freq: Once | INTRAVENOUS | Status: AC | PRN
  Administered 2017-11-20: 75 mL via INTRAVENOUS

## 2017-11-21 IMAGING — CT CT CHEST W/ CM
2 of 3 series · 15 of 30 positions shown, 18 images · IV contrast (iopamidol)
Comparison: Chest radiographs [DATE] and [DATE]

CLINICAL DATA: Pleuritic chest pain for 1 month. No acute injury,
prior relevant surgery or history of malignancy.

Creatinine was obtained on site at [HOSPITAL] at [REDACTED].Results: Creatinine 0.8 mg/dL.
EXAM:
CT CHEST WITH CONTRAST
TECHNIQUE: Multidetector CT imaging of the chest was performed during
intravenous contrast administration.
CONTRAST:  75mL [GB] IOPAMIDOL ([GB]) INJECTION 61%

[Series 3: chest with · axial · 0.79mm/px · z∈[-266,-4]mm · 10 of 129 slices shown, 13 images]
[im 12/129  mediastinal]
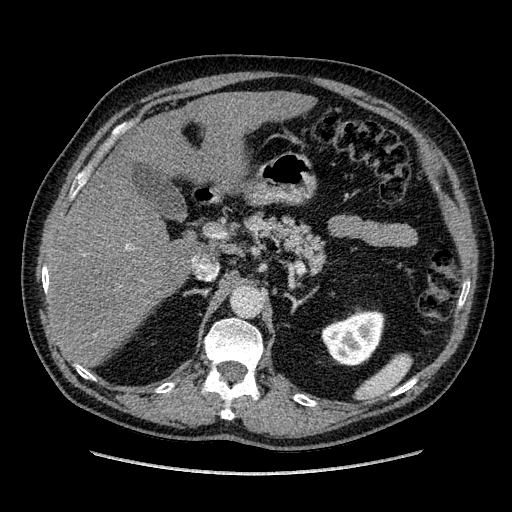
[im 12/129  lung]
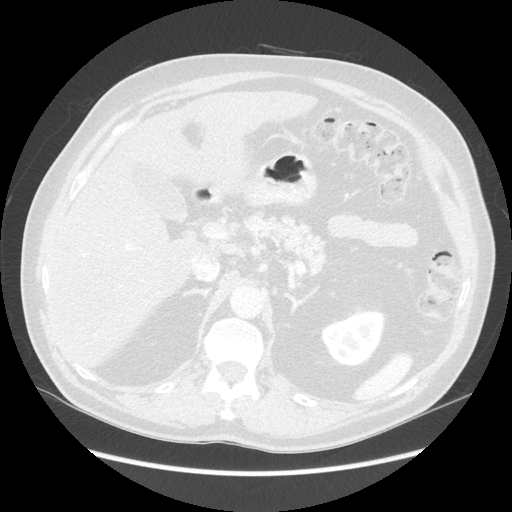
[im 24/129  lung]
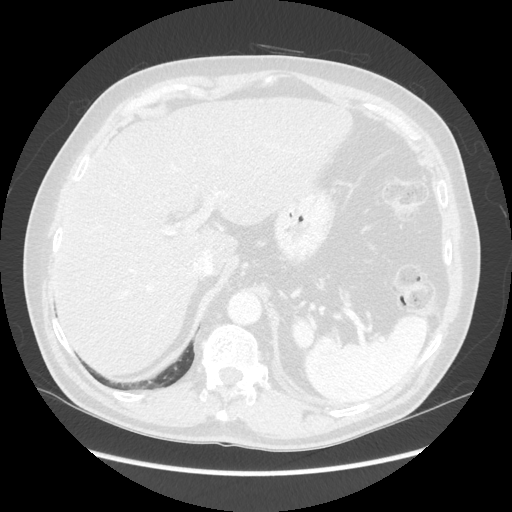
[im 35/129  lung]
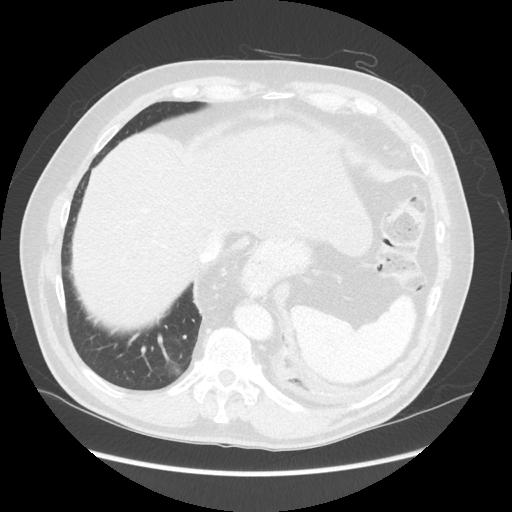
[im 47/129  lung]
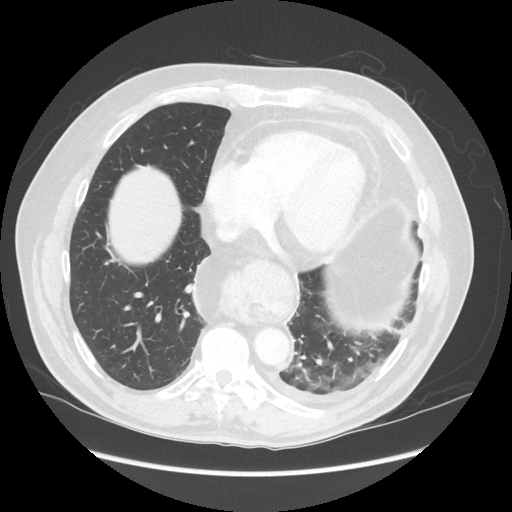
[im 59/129  mediastinal]
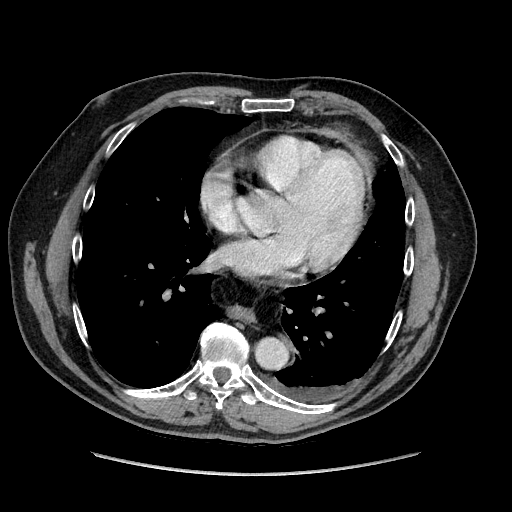
[im 59/129  lung]
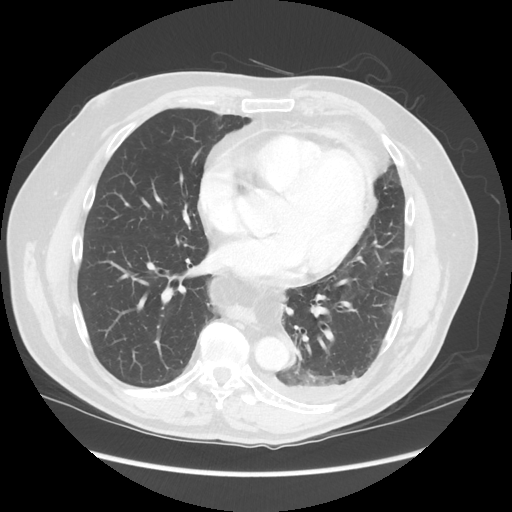
[im 70/129  lung]
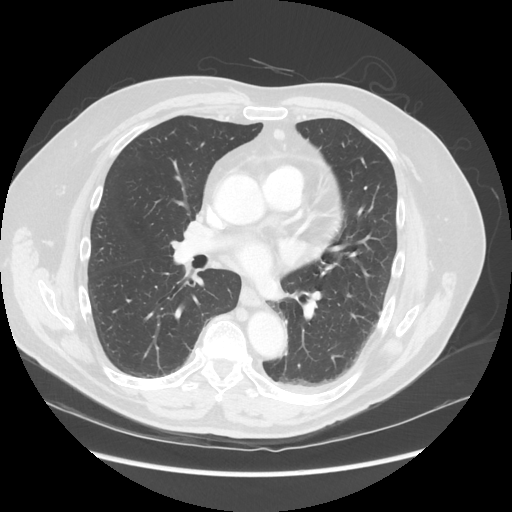
[im 82/129  lung]
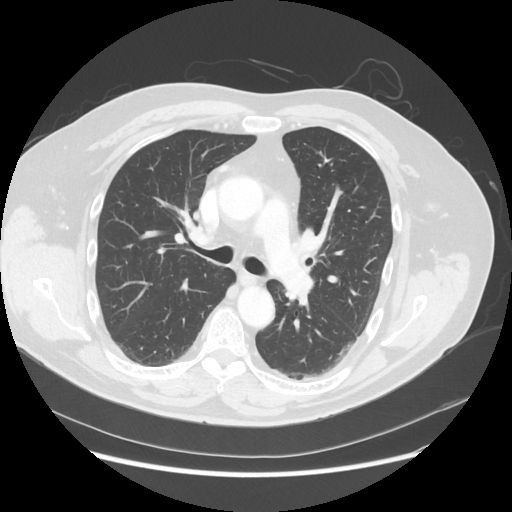
[im 94/129  lung]
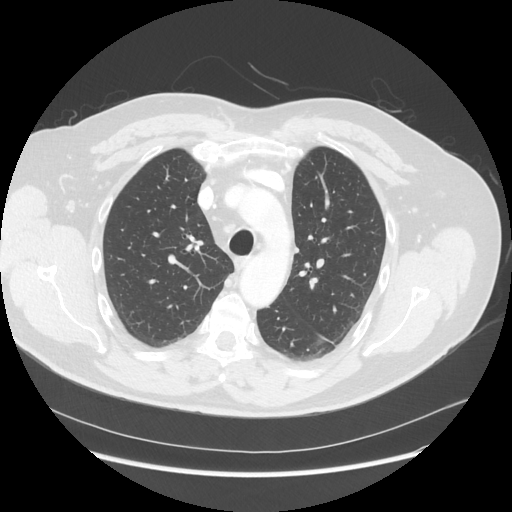
[im 105/129  mediastinal]
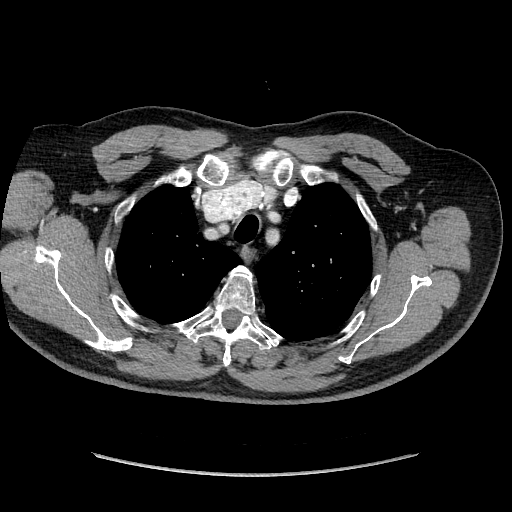
[im 105/129  lung]
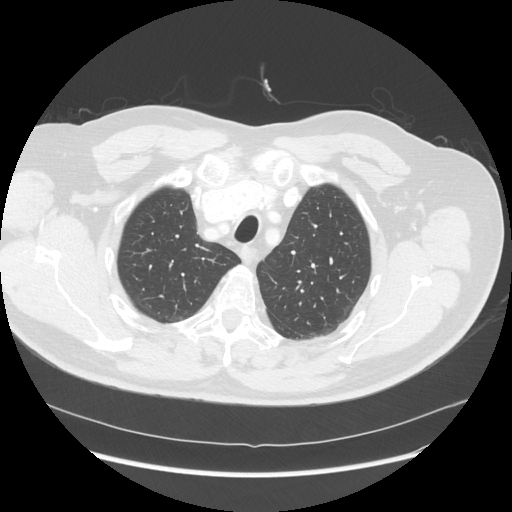
[im 117/129  lung]
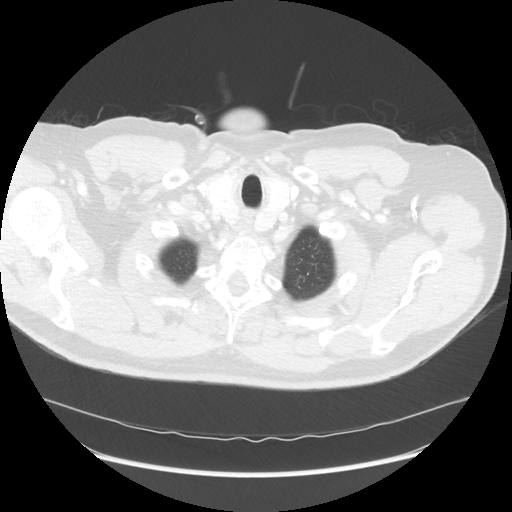

[Series 602: sagittal body · sagittal · 0.79mm/px · 5 of 162 slices shown]
[im 11/162  mediastinal]
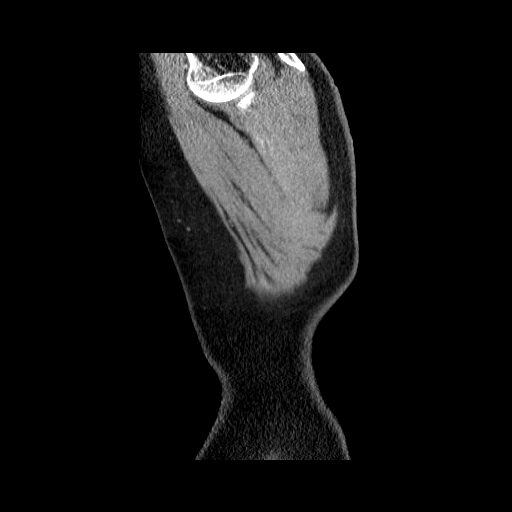
[im 33/162  mediastinal]
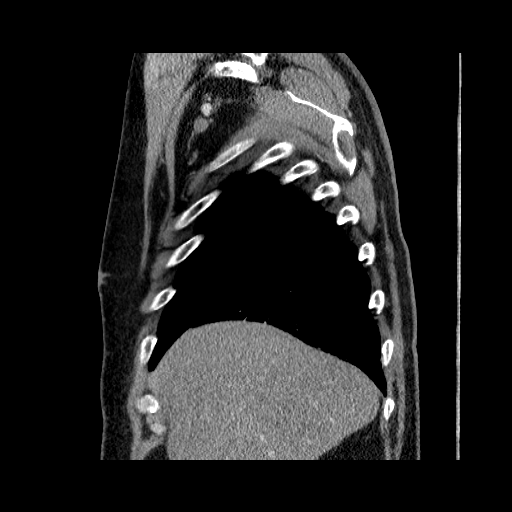
[im 54/162  mediastinal]
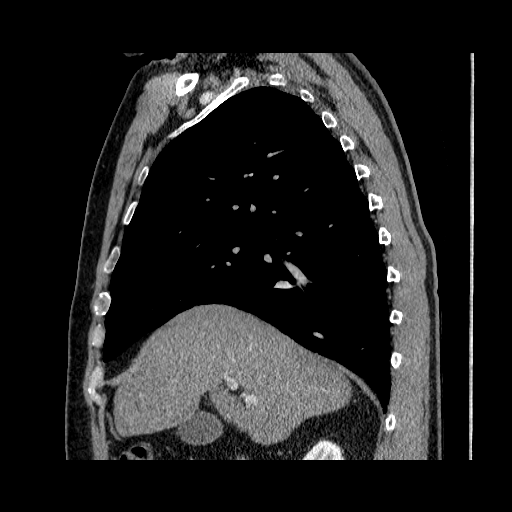
[im 76/162  mediastinal]
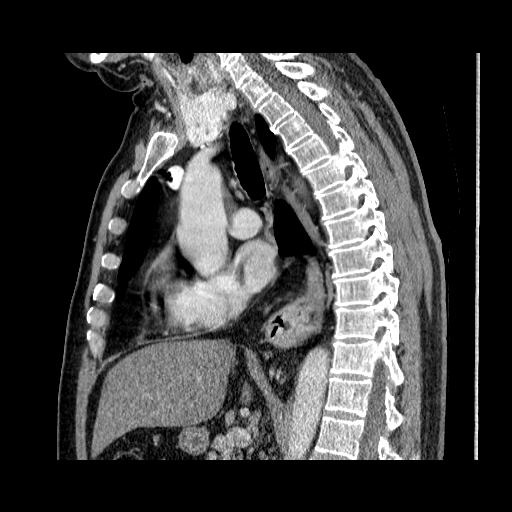
[im 86/162  mediastinal]
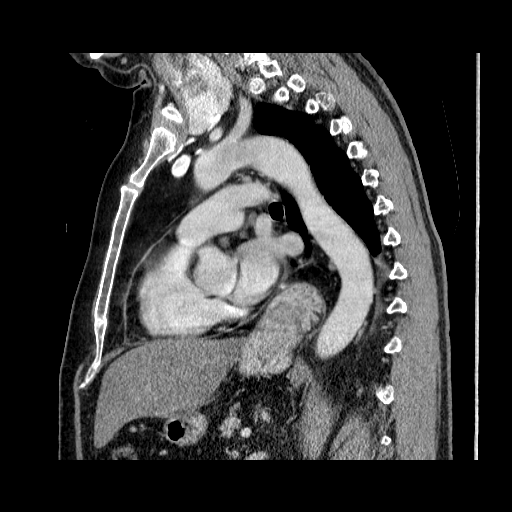

[15 of 30 positions shown; findings below may reference images not displayed]

FINDINGS: Cardiovascular: No significant vascular findings. There is mild
aortic tortuosity. The heart size is normal. There is a small amount
of pericardial fluid versus thickening anteriorly with mild edema or
scarring in the epicardial fat.

Mediastinum/Nodes: There are no enlarged mediastinal, hilar or
axillary lymph nodes.There are small mediastinal nodes, including an
8 mm node in the prevascular space (image 61). The thyroid gland is
heterogeneous with asymmetric enlargement of the right lobe,
contributing to mild tracheal deviation to the left, and consistent
with multinodular goiter. There is a moderate size hiatal hernia.

Lungs/Pleura: Small dependent left pleural effusion. There is no
right pleural effusion or pneumothorax. Mild linear left lower lobe
atelectasis. There is lesser atelectasis in the lingula and right
lower lobe. No confluent airspace opacity, endobronchial lesion or
suspicious pulmonary nodule.

Upper abdomen: Subjective hepatic steatosis. No focal hepatic
abnormality. Small lymph nodes in the porta hepatis are likely
reactive. The adrenal glands appear normal.

Musculoskeletal/Chest wall: There is no chest wall mass or
suspicious osseous finding. T12 hemangioma.
IMPRESSION: 1. Small left pleural effusion with associated
left-greater-than-right basilar atelectasis.
2. In addition, there is a small pericardial effusion versus
thickening with possible inflammation or scarring in the epicardial
fat. Consider pericarditis. Either of these findings may contribute
to pleuritic chest pain.
3. Moderate-size hiatal hernia.
4. Multinodular goiter with mild tracheal deviation to the left.
5. These results were called by telephone at the time of
interpretation on [DATE] at [DATE] to Dr. BILLIOT, who
verbally acknowledged these results.

## 2017-11-28 ENCOUNTER — Ambulatory Visit: Payer: BC Managed Care – PPO | Admitting: Cardiology

## 2017-11-28 ENCOUNTER — Encounter: Payer: Self-pay | Admitting: *Deleted

## 2017-11-28 DIAGNOSIS — E049 Nontoxic goiter, unspecified: Secondary | ICD-10-CM | POA: Diagnosis not present

## 2017-11-28 DIAGNOSIS — I309 Acute pericarditis, unspecified: Secondary | ICD-10-CM

## 2017-11-28 DIAGNOSIS — R071 Chest pain on breathing: Secondary | ICD-10-CM | POA: Diagnosis not present

## 2017-11-28 DIAGNOSIS — E785 Hyperlipidemia, unspecified: Secondary | ICD-10-CM | POA: Diagnosis not present

## 2017-11-28 DIAGNOSIS — R079 Chest pain, unspecified: Secondary | ICD-10-CM | POA: Insufficient documentation

## 2017-11-28 DIAGNOSIS — I319 Disease of pericardium, unspecified: Secondary | ICD-10-CM | POA: Insufficient documentation

## 2017-11-28 MED ORDER — COLCHICINE 0.6 MG PO TABS: 0.6000 mg | ORAL_TABLET | Freq: Two times a day (BID) | ORAL | 2 refills | Status: DC

## 2017-11-28 MED ORDER — IBUPROFEN 600 MG PO TABS: 600.0000 mg | ORAL_TABLET | Freq: Three times a day (TID) | ORAL | 0 refills | Status: DC

## 2017-11-28 NOTE — Progress Notes (Signed)
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  The pt relates a history of Lt sided chest and Lt shoulder pain in Sept. His symptoms then were pleuritic and resolved spontaneously. He was in his usual state of health till the Sunday prior to Thanksgiving when he again developed Lt chest and Lt shoulder pleuritic chest pain. He denies any nausea, diaphoresis, palpitations, or hemoptysis. He did note it was worse laying down, better sitting up. He saw Dr Marisue Humble who placed him on a NSAID and ordered a chest CT. This revealed a "small pleural effusion vs thickening and possible inflammation or scarring in the epicardial fat- consider pericarditis". It also revealed an incidental finding of a multinodular goiter, the pt has no history of any thyroid problems. Since he started the NSAID he feel much improved.    Current Outpatient Medications  Medication Sig Dispense Refill  . ALPRAZolam (XANAX) 0.5 MG tablet     . esomeprazole (NEXIUM) 40 MG capsule Take 40 mg by mouth daily at 12 noon.    . rosuvastatin (CRESTOR) 10 MG tablet Take 10 mg by mouth 3 (three) times a week.    . colchicine 0.6 MG tablet Take 1 tablet (0.6 mg total) by mouth 2 (two) times daily. 60 tablet 2  . ibuprofen (ADVIL,MOTRIN) 600 MG tablet Take 1 tablet (600 mg total) by mouth 3 (three) times daily. With food. 90 tablet 0   No current facility-administered medications for this visit.     Allergies  Allergen Reactions  . Sulfa Antibiotics Rash    Past Medical History:  Diagnosis Date  . GERD (gastroesophageal reflux disease)   . Hyperlipemia     Social History   Socioeconomic History  . Marital status: Married    Spouse name: Not on file  . Number of children: Not on file  . Years of education: Not on file  . Highest education level: Not on file    Social Needs  . Financial resource strain: Not on file  . Food insecurity - worry: Not on file  . Food insecurity - inability: Not on file  . Transportation needs - medical: Not on file  . Transportation needs - non-medical: Not on file  Occupational History  . Not on file  Tobacco Use  . Smoking status: Current Some Day Smoker    Types: Cigars  . Smokeless tobacco: Never Used  Substance and Sexual Activity  . Alcohol use: Yes    Alcohol/week: 4.2 oz    Types: 7 Cans of beer per week  . Drug use: No  . Sexual activity: Not on file  Other Topics Concern  . Not on file  Social History Narrative  . Not on file     Family History  Problem Relation Age of Onset  . Colon cancer Paternal Grandfather   . Heart disease Father      Review of Systems: General: negative for chills, fever, night sweats or weight changes.  Cardiovascular: negative for chest pain, dyspnea on exertion, edema, orthopnea, palpitations, paroxysmal nocturnal dyspnea or shortness of breath Dermatological: negative for rash Respiratory: negative for cough or wheezing Urologic: negative for hematuria Abdominal: negative for nausea, vomiting, diarrhea, bright red blood per rectum, melena, or hematemesis Neurologic: negative for visual changes, syncope, or dizziness All other systems reviewed and are  otherwise negative except as noted above.    Blood pressure 140/90, pulse 67, height 5\' 11"  (1.803 m), weight 204 lb (92.5 kg).  General appearance: alert, cooperative, no distress and mildly obese Neck: no carotid bruit, no JVD, supple, symmetrical, trachea midline and thyroid full, non tender Lungs: clear to auscultation bilaterally Heart: regular rate and rhythm and no rub Abdomen: soft, non-tender; bowel sounds normal; no masses,  no organomegaly Extremities: extremities normal, atraumatic, no cyanosis or edema Pulses: 2+ and symmetric Skin: Skin color, texture, turgor normal. No rashes or  lesions Neurologic: Grossly normal  EKG NSR  ASSESSMENT AND PLAN:   Pericarditis Presentation and chest CT suggestive of acute pericarditis  Goiter Incidental finding on CT scan  Dyslipidemia On statin Rx   PLAN  I suspect recurrent viral/ idiopathic pericarditis. He has already improved with addition of NSAID. As per guidelines I have prescribed Motrin 600 mg TID x 4 weeks (then taper off) and Colchicine 0.6mg  BID for 3 months. He is already on Nexium. We'll arrange for an echo and f/u with Dr Claiborne Billings in 4 weeks. I spoke with dr Claiborne Billings in the office today and he suggested the pt avoid any strenuous exercise for another week, he had asked about riding his bike.   Kerin Ransom PA-C 11/28/2017 4:17 PM

## 2017-11-28 NOTE — Assessment & Plan Note (Signed)
Pt referred for chest pain-

## 2017-11-28 NOTE — Assessment & Plan Note (Signed)
Presentation and chest CT suggestive of acute pericarditis

## 2017-11-28 NOTE — Assessment & Plan Note (Signed)
On statin Rx 

## 2017-11-28 NOTE — Patient Instructions (Signed)
Medication Instructions: STOP Aleve  START Ibuprofen 600 mg three times daily with food for 4 weeks.  START Colchicine 0.6 mg twice daily for 3 months.   Testing/Procedures: Your physician has requested that you have an echocardiogram. Echocardiography is a painless test that uses sound waves to create images of your heart. It provides your doctor with information about the size and shape of your heart and how well your heart's chambers and valves are working. This procedure takes approximately one hour. There are no restrictions for this procedure.  Follow-Up: Your physician recommends that you schedule a follow-up appointment in: 4 weeks with Dr. Claiborne Billings.  If you need a refill on your cardiac medications before your next appointment, please call your pharmacy.

## 2017-11-28 NOTE — Assessment & Plan Note (Signed)
Incidental finding on CT scan-  

## 2017-12-03 ENCOUNTER — Other Ambulatory Visit: Payer: Self-pay

## 2017-12-03 ENCOUNTER — Ambulatory Visit (HOSPITAL_COMMUNITY): Payer: BC Managed Care – PPO | Attending: Cardiology

## 2017-12-03 DIAGNOSIS — Z720 Tobacco use: Secondary | ICD-10-CM | POA: Insufficient documentation

## 2017-12-03 DIAGNOSIS — I77810 Thoracic aortic ectasia: Secondary | ICD-10-CM | POA: Diagnosis not present

## 2017-12-03 DIAGNOSIS — R079 Chest pain, unspecified: Secondary | ICD-10-CM | POA: Diagnosis present

## 2017-12-03 DIAGNOSIS — R071 Chest pain on breathing: Secondary | ICD-10-CM | POA: Diagnosis not present

## 2017-12-03 DIAGNOSIS — I517 Cardiomegaly: Secondary | ICD-10-CM | POA: Diagnosis not present

## 2017-12-03 DIAGNOSIS — Z8249 Family history of ischemic heart disease and other diseases of the circulatory system: Secondary | ICD-10-CM | POA: Diagnosis not present

## 2017-12-03 DIAGNOSIS — E785 Hyperlipidemia, unspecified: Secondary | ICD-10-CM | POA: Insufficient documentation

## 2017-12-03 DIAGNOSIS — I309 Acute pericarditis, unspecified: Secondary | ICD-10-CM | POA: Diagnosis not present

## 2017-12-11 ENCOUNTER — Telehealth: Payer: Self-pay | Admitting: Cardiology

## 2017-12-11 NOTE — Telephone Encounter (Signed)
Patient has been made aware of the Luke's recommendation and has verbalized his understanding.

## 2017-12-11 NOTE — Telephone Encounter (Signed)
Attempted to call back. Line was busy.  

## 2017-12-11 NOTE — Telephone Encounter (Signed)
t would like for somebody to explain his Echo results from 12-03-17 please.He saw the results on My-Chart,just needs somebody to explain it.l

## 2017-12-11 NOTE — Telephone Encounter (Signed)
If he is doing well its OK to ease back into his usual routine. OK to go on a cruise in Jan if he is feeling well.   Kerin Ransom PA-C 12/11/2017 3:05 PM

## 2017-12-11 NOTE — Telephone Encounter (Signed)
Patient called for ECHO results and education on the meaning of the results. He has been educated and verbalized his understanding.  Notes recorded by Erlene Quan, PA-C on 12/06/2017 at 3:59 PM EST Please let the pt know is echo looked good-same Rx  He does has two questions for [facility]: 1. Can he ride his bike now? 2. Is it ok for him to go on a cruise in the beginning of January.

## 2017-12-30 DIAGNOSIS — K219 Gastro-esophageal reflux disease without esophagitis: Secondary | ICD-10-CM | POA: Insufficient documentation

## 2017-12-30 DIAGNOSIS — E785 Hyperlipidemia, unspecified: Secondary | ICD-10-CM | POA: Insufficient documentation

## 2017-12-31 IMAGING — US US THYROID
1 series · 13 of 25 positions shown · non-contrast
Comparison: None.

CLINICAL DATA: Incidental on CT.

EXAM:
THYROID ULTRASOUND
TECHNIQUE: Ultrasound examination of the thyroid gland and adjacent soft
tissues was performed.

[Series 1: us thyroid · 0.08mm/px · 13 of 73 slices shown]
[im 1/73]
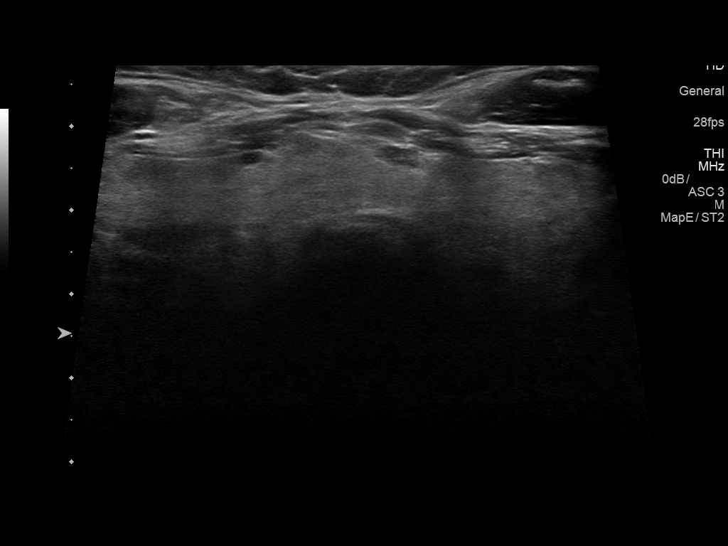
[im 7/73]
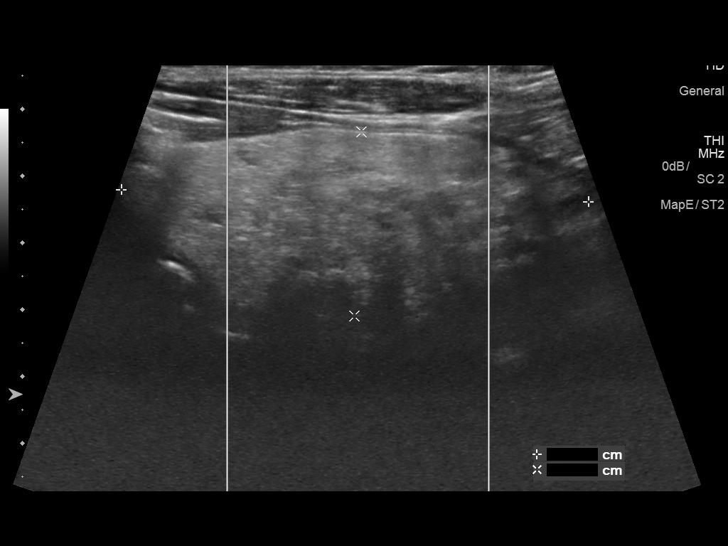
[im 13/73]
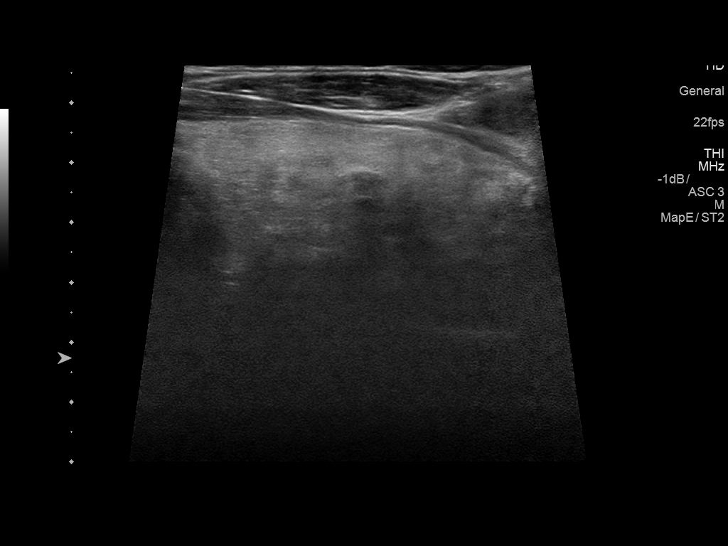
[im 19/73]
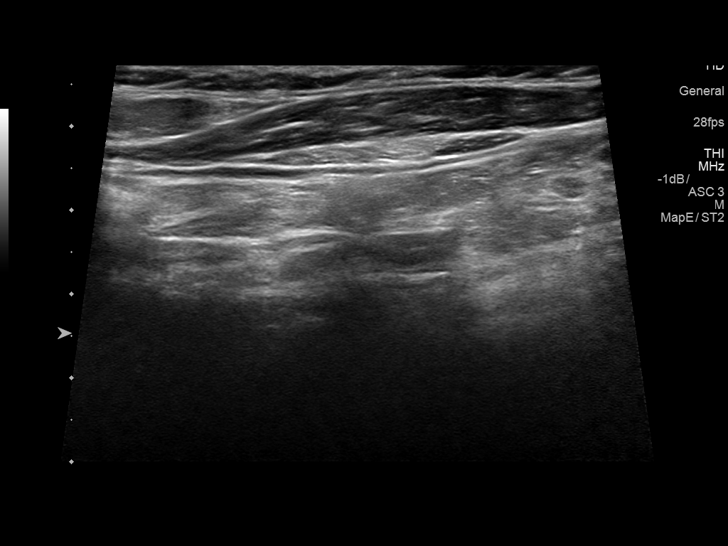
[im 25/73]
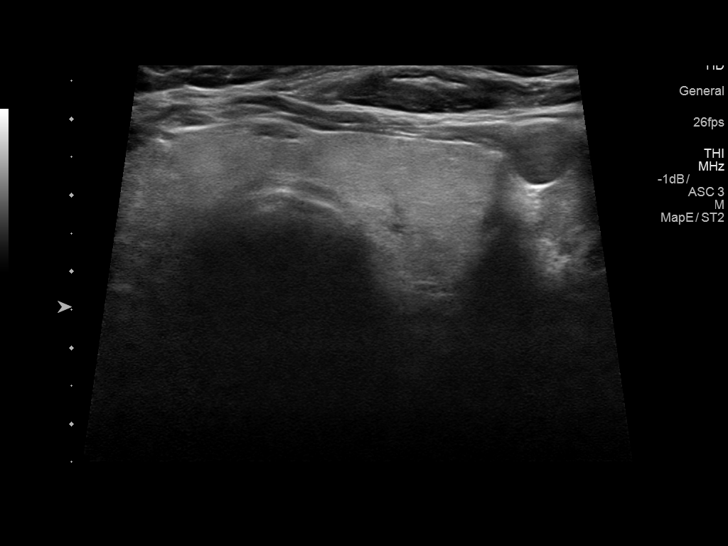
[im 31/73]
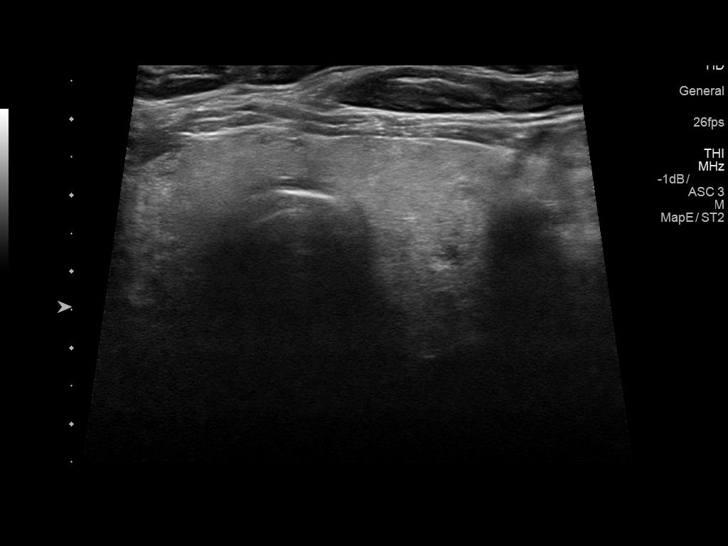
[im 37/73]
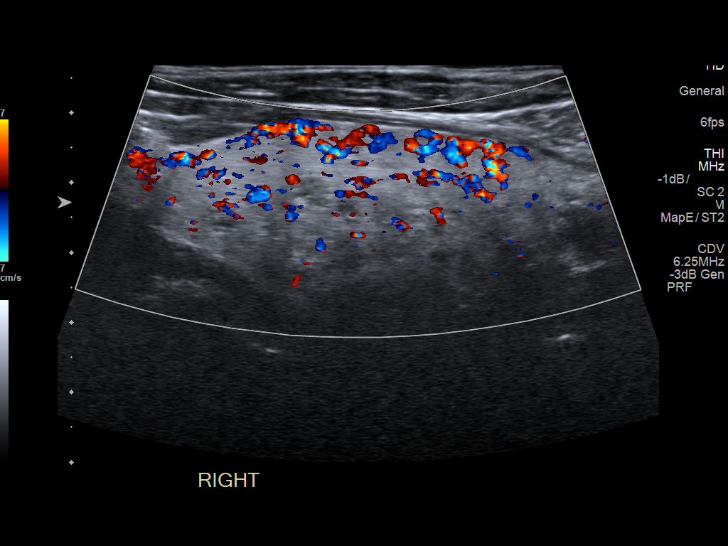
[im 43/73]
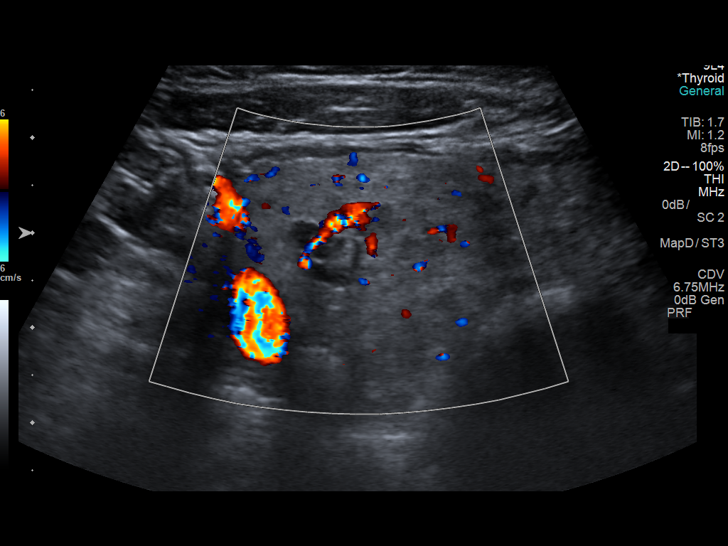
[im 49/73]
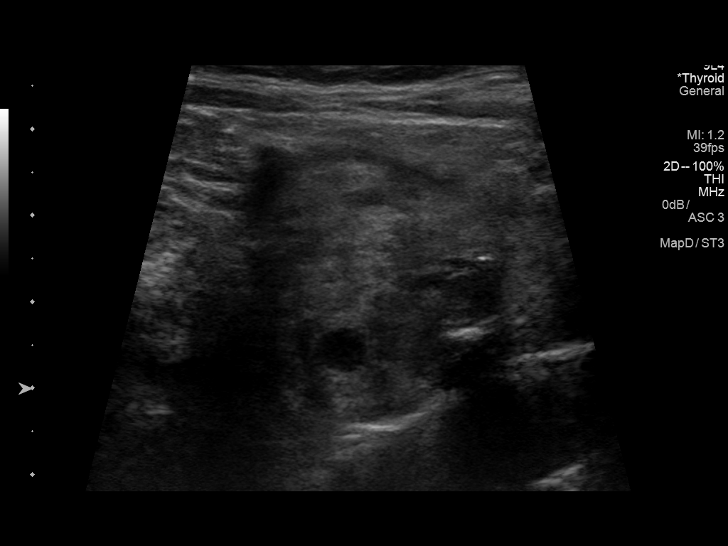
[im 55/73]
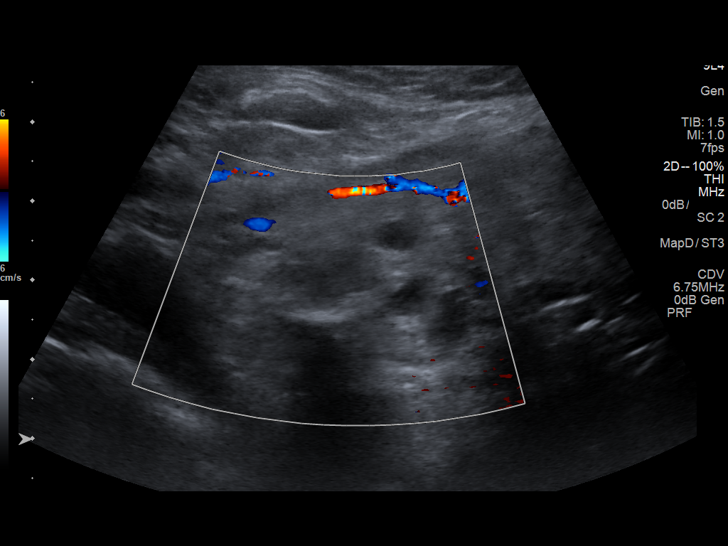
[im 61/73]
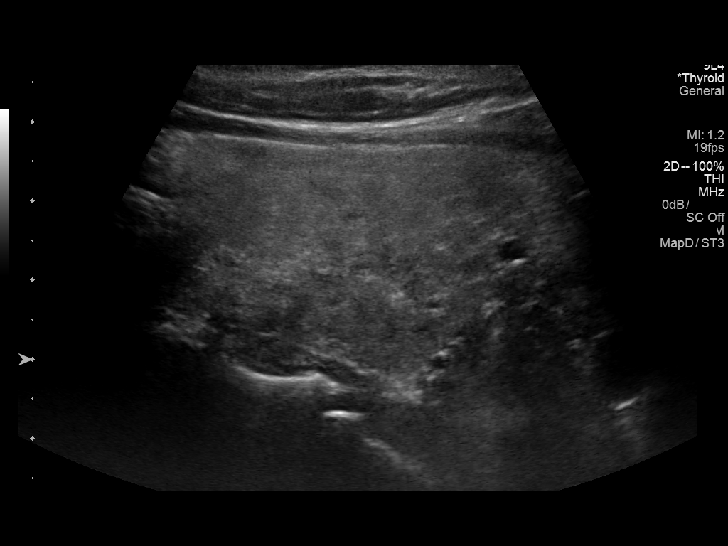
[im 67/73]
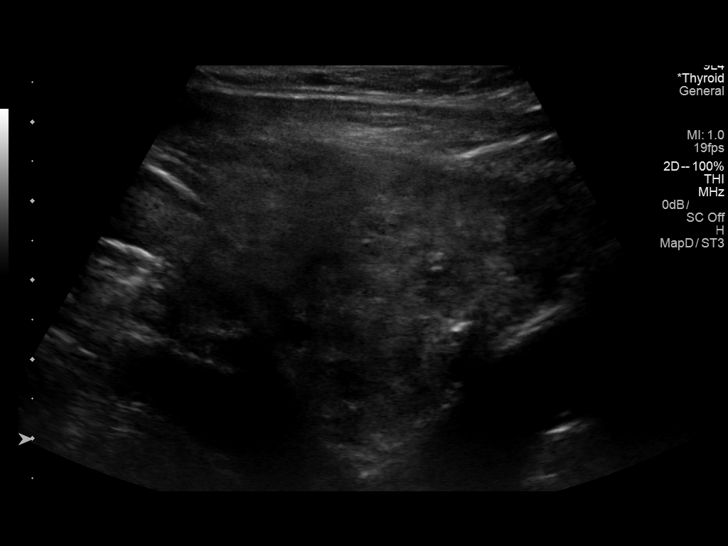
[im 73/73]
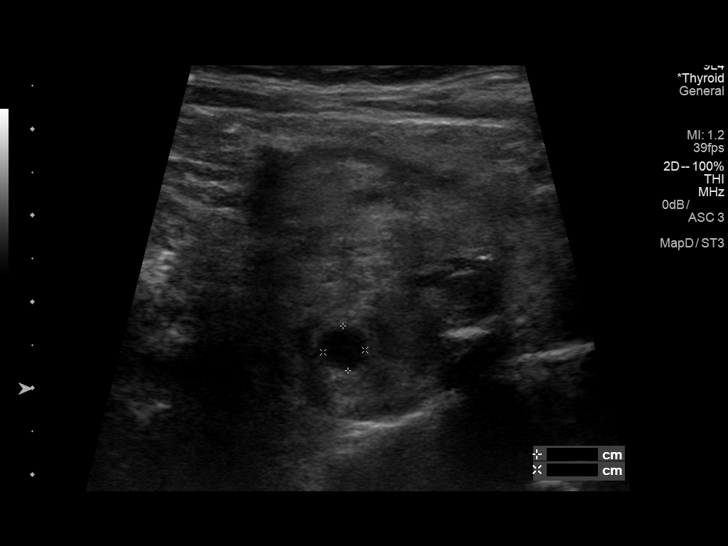

[13 of 25 positions shown; findings below may reference images not displayed]

FINDINGS: Parenchymal Echotexture: Markedly heterogenous

Isthmus: 0.9 cm

Right lobe: 7.0 x 2.8 x 2.9 cm

Left lobe: 6.6 x 2.3 x 2.0 cm

_________________________________________________________

Estimated total number of nodules >/= 1 cm: 0

Number of spongiform nodules >/=  2 cm not described below (TR1): 0

Number of mixed cystic and solid nodules >/= 1.5 cm not described
below (TR2): 0

_________________________________________________________

Nodule # 1:

Location: Right; Mid

Maximum size: 0.8 cm; Other 2 dimensions: 0.8 x 0.7 cm

Composition: solid/almost completely solid (2)

Echogenicity: hypoechoic (2)

Shape: not taller-than-wide (0)

Margins: smooth (0)

Echogenic foci: punctate echogenic foci (3)

ACR TI-RADS total points: 7.

ACR TI-RADS risk category: TR5 (>/= 7 points).

ACR TI-RADS recommendations:

*Given size (>/= 0.5 - 0.9 cm) and appearance, a follow-up
ultrasound in 1 year should be considered based on TI-RADS criteria.

_________________________________________________________

Small lower pole nodule measures 0.8 cm and does not meet criteria
for biopsy nor follow-up.
IMPRESSION: Right mid nodule 1 meets criteria for annual follow-up.

The gland is heterogeneous and enlarged.

The above is in keeping with the ACR TI-RADS recommendations - [HOSPITAL] [L7];[DATE].

## 2018-01-13 ENCOUNTER — Ambulatory Visit: Payer: BC Managed Care – PPO | Admitting: Cardiovascular Disease

## 2018-01-13 ENCOUNTER — Encounter: Payer: Self-pay | Admitting: Cardiovascular Disease

## 2018-01-13 VITALS — BP 148/96 | HR 88 | Ht 71.0 in | Wt 195.6 lb

## 2018-01-13 DIAGNOSIS — I300 Acute nonspecific idiopathic pericarditis: Secondary | ICD-10-CM

## 2018-01-13 DIAGNOSIS — E785 Hyperlipidemia, unspecified: Secondary | ICD-10-CM | POA: Diagnosis not present

## 2018-01-13 DIAGNOSIS — R071 Chest pain on breathing: Secondary | ICD-10-CM | POA: Diagnosis not present

## 2018-01-13 NOTE — Progress Notes (Signed)
Cardiology Office Note    Date:  01/15/2018   ID:  Jeffrey Hanson, DOB 1956/12/03, MRN [id number]  PCP:  Gaynelle Arabian, MD  Cardiologist:  Shelva Majestic, MD   Chief Complaint  Patient presents with  . Follow-up   Initial evaluation with me after being seen by Jeffrey Hanson on 11/28/2017.  History of Present Illness:  Jeffrey Hanson is a 62 y.o. male who presents for cardiology evaluation with me after he was seen by Jeffrey Hanson in November 2018 with complaints of pleuritic chest pain.  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  His symptoms were pleuritic and eventually resolve spontaneously.  He prior to Thanksgiving, he again developed similar left chest and shoulder discomfort which again was pleuritic.  It was worse with lying down and better with sitting up.  He was seen by Dr. Marisue Humble who initiated nonsteroidal inflammatory therapy and scheduled him for a chest CT.  The CT revealed a small pleural effusion with thickening and possible inflammation or scarring in the epicardial fat.  Incidentally, he was also found to have his multinodular quarter.  Was seen by Jeffrey Hanson on 11/28/2017.  He felt his symptoms were secondary to pericarditis.  He initiated, Motrin 600 mg 3 times a day for 4 weeks and colchicine 0.6 mg twice a day for 3 months.  The patient has been on Nexium.  He underwent a subsequent echo Doppler study in 12/03/2017 which showed systolic function with an EF of 50-55%.  There was borderline mild dilation of his ascending aortia at 40 mm.  There was mild biatrial enlargement.  Socially, he has continued to do well.  He rides a bicycle 3-4 days per week and often rides up to 20 miles at a time.  He also has a bike.  He denies any recurrent pleuritic chest pain.  He denies fever chills or night sweats.  He presents for reevaluation.   Past Medical History:  Diagnosis Date  . GERD (gastroesophageal reflux disease)     . Hyperlipemia     Past Surgical History:  Procedure Laterality Date  . MOUTH SURGERY    . SHOULDER ARTHROSCOPY     left, bone spur  . TONSILLECTOMY AND ADENOIDECTOMY    . VASECTOMY      Current Medications: Outpatient Medications Prior to Visit  Medication Sig Dispense Refill  . ALPRAZolam (XANAX) 0.5 MG tablet     . colchicine 0.6 MG tablet Take 1 tablet (0.6 mg total) by mouth 2 (two) times daily. 60 tablet 2  . esomeprazole (NEXIUM) 40 MG capsule Take 40 mg by mouth daily at 12 noon.    Marland Kitchen ibuprofen (ADVIL,MOTRIN) 600 MG tablet Take 1 tablet (600 mg total) by mouth 3 (three) times daily. With food. 90 tablet 0  . rosuvastatin (CRESTOR) 10 MG tablet Take 10 mg by mouth 3 (three) times a week.     No facility-administered medications prior to visit.      Allergies:   Sulfa antibiotics   Social History   Socioeconomic History  . Marital status: Married    Spouse name: None  . Number of children: None  . Years of education: None  . Highest education level: None  Social Needs  . Financial resource strain: None  . Food insecurity - worry: None  . Food insecurity - inability: None  . Transportation needs - medical: None  . Transportation needs - non-medical: None  Occupational  History  . None  Tobacco Use  . Smoking status: Current Some Day Smoker    Types: Cigars  . Smokeless tobacco: Never Used  Substance and Sexual Activity  . Alcohol use: Yes    Alcohol/week: 4.2 oz    Types: 7 Cans of beer per week  . Drug use: No  . Sexual activity: None  Other Topics Concern  . None  Social History Narrative  . None     Family History:  The patient's family history includes Colon cancer in his paternal grandfather; Heart disease in his father.   ROS General: Negative; No fevers, chills, or night sweats;  HEENT: Negative; No changes in vision or hearing, sinus congestion, difficulty swallowing Pulmonary: Negative; No cough, wheezing, shortness of breath,  hemoptysis Cardiovascular: see HPI GI: Negative; No nausea, vomiting, diarrhea, or abdominal pain GU: Negative; No dysuria, hematuria, or difficulty voiding Musculoskeletal: Negative; no myalgias, joint pain, or weakness Hematologic/Oncology: Negative; no easy bruising, bleeding Endocrine: Negative; no heat/cold intolerance; no diabetes Neuro: Negative; no changes in balance, headaches Skin: Negative; No rashes or skin lesions Psychiatric: Negative; No behavioral problems, depression Sleep: Negative; No snoring, daytime sleepiness, hypersomnolence, bruxism, restless legs, hypnogognic hallucinations, no cataplexy Other comprehensive 14 point system review is negative.   PHYSICAL EXAM:   VS:  BP (!) 148/96   Pulse 88   Ht 5\' 11"  (1.803 m)   Wt 195 lb 9.6 oz (88.7 kg)   BMI 27.28 kg/m    Repeat blood pressure by me was 120/84.       Wt Readings from Last 3 Encounters:  01/13/18 195 lb 9.6 oz (88.7 kg)  11/28/17 204 lb (92.5 kg)  12/21/15 203 lb (92.1 kg)    General: Alert, oriented, no distress.  Skin: normal turgor, no rashes, warm and dry HEENT: Normocephalic, atraumatic. Pupils equal round and reactive to light; sclera anicteric; extraocular muscles intact; Nose without nasal septal hypertrophy Mouth/Parynx benign; Mallinpatti scale 3 Neck: No JVD, no carotid bruits; normal carotid upstroke Lungs: clear to ausculatation and percussion; no wheezing or rales Chest wall: without tenderness to palpitation Heart: PMI not displaced, RRR, s1 s2 normal, 1/6 systolic murmur, no diastolic murmur, no  pericardial friction rub, gallops, thrills, or heaves Abdomen: soft, nontender; no hepatosplenomehaly, BS+; abdominal aorta nontender and not dilated by palpation. Back: no CVA tenderness Pulses 2+ Musculoskeletal: full range of motion, normal strength, no joint deformities Extremities: no clubbing cyanosis or edema, Homan's sign negative  Neurologic: grossly nonfocal; Cranial nerves  grossly wnl Psychologic: Normal mood and affect   Studies/Labs Reviewed:   EKG:  EKG is ordered today. ECG (independently read by me): Normal sinus rhythm at 88 bpm.  Borderline LVH by voltage.  Nonspecific ST changes.  QTc interval 479 ms.  Recent Labs: No flowsheet data found.   No flowsheet data found.  No flowsheet data found. No results found for: MCV No results found for: TSH No results found for: HGBA1C   BNP No results found for: BNP  ProBNP No results found for: PROBNP   Lipid Panel  No results found for: CHOL, TRIG, HDL, CHOLHDL, VLDL, LDLCALC, LDLDIRECT   RADIOLOGY: No results found.   Additional studies/ records that were reviewed today include:  I reviewed the records from [facility], as well as his echo Doppler study.  ASSESSMENT:    1. Chest pain on breathing   2. Idiopathic pericarditis, unspecified chronicity   3. 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  He developed pleuritic chest pain in September and again in November 2018.  Symptoms were highly suggestive of transient pericarditis.  His symptoms have improved with nonsteroidal intense inflammatory medication and he also has been on colchicine.  I have recommended continuation of a 3 month use.  His echo Doppler study was reviewed with him in detail.  He has borderline mild dilation of his a sending aorta.  There is mild left atrial dilation.  He had low normal EF at 5055% with grade 1 diastolic dysfunction.  There were no significant valvular pathology.   Clinically he is stable.  He is on rosuvastatin 10 mg 3 times per week for hyperlipidemia.  He continues to exercise, riding his bike.  It does not rise as often in the winter months.  I have recommended that he continue current therapy.  As long as he is stable, I will see him on an as-needed basis or if  problems arise.   Medication Adjustments/Labs and Tests Ordered: Current medicines are reviewed at length with the patient today.  Concerns regarding medicines are outlined above.  Medication changes, Labs and Tests ordered today are listed in the Patient Instructions below. Patient Instructions  Medication Instructions:  Your physician recommends that you continue on your current medications as directed. Please refer to the Current Medication list given to you today.  Follow-Up: AS NEEDED with Dr. Claiborne Billings.   If you need a refill on your cardiac medications before your next appointment, please call your pharmacy.      Signed, Shelva Majestic, MD  01/15/2018 [facility] [address], [place], [place], [place]  [address] Phone: [phone]

## 2018-01-13 NOTE — Patient Instructions (Signed)
Medication Instructions:  Your physician recommends that you continue on your current medications as directed. Please refer to the Current Medication list given to you today.  Follow-Up: AS NEEDED with Dr. Claiborne Billings.   If you need a refill on your cardiac medications before your next appointment, please call your pharmacy.

## 2018-01-15 ENCOUNTER — Encounter: Payer: Self-pay | Admitting: Cardiovascular Disease

## 2018-01-28 ENCOUNTER — Ambulatory Visit
Admission: RE | Admit: 2018-01-28 | Discharge: 2018-01-28 | Disposition: A | Discharge status: Home / Self Care | Payer: BC Managed Care – PPO | Source: Ambulatory Visit | Attending: Family Medicine | Admitting: Family Medicine

## 2018-01-28 ENCOUNTER — Other Ambulatory Visit: Payer: Self-pay | Admitting: Family Medicine

## 2018-01-28 DIAGNOSIS — E042 Nontoxic multinodular goiter: Secondary | ICD-10-CM

## 2018-06-21 ENCOUNTER — Other Ambulatory Visit: Payer: Self-pay | Admitting: Cardiology

## 2018-07-22 ENCOUNTER — Other Ambulatory Visit: Payer: Self-pay | Admitting: Cardiology

## 2019-01-08 ENCOUNTER — Encounter: Payer: Self-pay | Admitting: Gastroenterology

## 2019-02-03 ENCOUNTER — Ambulatory Visit (AMBULATORY_SURGERY_CENTER): Payer: Self-pay | Admitting: *Deleted

## 2019-02-03 VITALS — Ht 71.0 in | Wt 202.0 lb

## 2019-02-03 DIAGNOSIS — Z86010 Personal history of colonic polyps: Secondary | ICD-10-CM

## 2019-02-03 MED ORDER — NA SULFATE-K SULFATE-MG SULF 17.5-3.13-1.6 GM/177ML PO SOLN
ORAL | 0 refills | Status: DC
Start: 2019-02-03 — End: 2019-02-23

## 2019-02-03 NOTE — Progress Notes (Signed)
Patient denies any allergies to eggs or soy. Patient denies any problems with anesthesia/sedation. Patient denies any oxygen use at home. Patient denies taking any diet/weight loss medications or blood thinners. EMMI education offered, pt declined. Suprep coupon $15 off given to pt.

## 2019-02-09 ENCOUNTER — Encounter: Payer: Self-pay | Admitting: Gastroenterology

## 2019-02-17 ENCOUNTER — Encounter: Payer: BC Managed Care – PPO | Admitting: Gastroenterology

## 2019-02-23 ENCOUNTER — Ambulatory Visit (AMBULATORY_SURGERY_CENTER): Payer: BC Managed Care – PPO | Admitting: Gastroenterology

## 2019-02-23 ENCOUNTER — Encounter: Payer: Self-pay | Admitting: Gastroenterology

## 2019-02-23 VITALS — BP 119/73 | HR 72 | Temp 98.0°F | Resp 20 | Ht 71.0 in | Wt 202.0 lb

## 2019-02-23 DIAGNOSIS — Z86010 Personal history of colonic polyps: Secondary | ICD-10-CM | POA: Diagnosis present

## 2019-02-23 DIAGNOSIS — D127 Benign neoplasm of rectosigmoid junction: Secondary | ICD-10-CM

## 2019-02-23 DIAGNOSIS — D129 Benign neoplasm of anus and anal canal: Secondary | ICD-10-CM

## 2019-02-23 DIAGNOSIS — K635 Polyp of colon: Secondary | ICD-10-CM

## 2019-02-23 DIAGNOSIS — D128 Benign neoplasm of rectum: Secondary | ICD-10-CM

## 2019-02-23 DIAGNOSIS — D125 Benign neoplasm of sigmoid colon: Secondary | ICD-10-CM

## 2019-02-23 DIAGNOSIS — D122 Benign neoplasm of ascending colon: Secondary | ICD-10-CM

## 2019-02-23 MED ORDER — SODIUM CHLORIDE 0.9 % IV SOLN: 500.0000 mL | Freq: Once | INTRAVENOUS | Status: DC

## 2019-02-23 NOTE — Patient Instructions (Signed)
Handouts Provided:  Polyps  YOU HAD AN ENDOSCOPIC PROCEDURE TODAY AT THE [facility]:   Refer to the procedure report that was given to you for any specific questions about what was found during the examination.  If the procedure report does not answer your questions, please call your gastroenterologist to clarify.  If you requested that your care partner not be given the details of your procedure findings, then the procedure report has been included in a sealed envelope for you to review at your convenience later.  YOU SHOULD EXPECT: Some feelings of bloating in the abdomen. Passage of more gas than usual.  Walking can help get rid of the air that was put into your GI tract during the procedure and reduce the bloating. If you had a lower endoscopy (such as a colonoscopy or flexible sigmoidoscopy) you may notice spotting of blood in your stool or on the toilet paper. If you underwent a bowel prep for your procedure, you may not have a normal bowel movement for a few days.  Please Note:  You might notice some irritation and congestion in your nose or some drainage.  This is from the oxygen used during your procedure.  There is no need for concern and it should clear up in a day or so.  SYMPTOMS TO REPORT IMMEDIATELY:   Following lower endoscopy (colonoscopy or flexible sigmoidoscopy):  Excessive amounts of blood in the stool  Significant tenderness or worsening of abdominal pains  Swelling of the abdomen that is new, acute  Fever of 100F or higher  For urgent or emergent issues, a gastroenterologist can be reached at any hour by calling [phone].   DIET:  We do recommend a small meal at first, but then you may proceed to your regular diet.  Drink plenty of fluids but you should avoid alcoholic beverages for 24 hours.  ACTIVITY:  You should plan to take it easy for the rest of today and you should NOT DRIVE or use heavy machinery until tomorrow (because of the sedation  medicines used during the test).    FOLLOW UP: Our staff will call the number listed on your records the next business day following your procedure to check on you and address any questions or concerns that you may have regarding the information given to you following your procedure. If we do not reach you, we will leave a message.  However, if you are feeling well and you are not experiencing any problems, there is no need to return our call.  We will assume that you have returned to your regular daily activities without incident.  If any biopsies were taken you will be contacted by phone or by letter within the next 1-3 weeks.  Please call us at [phone] if you have not heard about the biopsies in 3 weeks.    SIGNATURES/CONFIDENTIALITY: You and/or your care partner have signed paperwork which will be entered into your electronic medical record.  These signatures attest to the fact that that the information above on your After Visit Summary has been reviewed and is understood.  Full responsibility of the confidentiality of this discharge information lies with you and/or your care-partner.  

## 2019-02-23 NOTE — Progress Notes (Signed)
PT taken to PACU. Monitors in place. VSS. Report given to RN. 

## 2019-02-23 NOTE — Progress Notes (Signed)
Called to room to assist during endoscopic procedure.  Patient ID and intended procedure confirmed with present staff. Received instructions for my participation in the procedure from the performing physician.  

## 2019-02-23 NOTE — Progress Notes (Signed)
Pt's states no medical or surgical changes since previsit or office visit. 

## 2019-02-23 NOTE — Op Note (Signed)
[facility] Patient Name: Jeffrey Hanson Procedure Date: 02/23/2019 9:26 AM MRN: [id number] Endoscopist: Remo Lipps P. Havery Moros , MD Age: 63 Referring MD:  Date of Birth: August 07, 1956 Gender: Male Account #: [id number] Procedure:                Colonoscopy Indications:              Surveillance: Personal history of adenomatous                            polyps on last colonoscopy 3 years ago (multiple                            adenomas, one tubulovillous) Medicines:                Monitored Anesthesia Care Procedure:                Pre-Anesthesia Assessment:                           - Prior to the procedure, a History and Physical                            was performed, and patient medications and                            allergies were reviewed. The patient's tolerance of                            previous anesthesia was also reviewed. The risks                            and benefits of the procedure and the sedation                            options and risks were discussed with the patient.                            All questions were answered, and informed consent                            was obtained. Prior Anticoagulants: The patient has                            taken no previous anticoagulant or antiplatelet                            agents. ASA Grade Assessment: II - A patient with                            mild systemic disease. After reviewing the risks                            and benefits, the patient was deemed in  satisfactory condition to undergo the procedure.                           After obtaining informed consent, the colonoscope                            was passed under direct vision. Throughout the                            procedure, the patient's blood pressure, pulse, and                            oxygen saturations were monitored continuously. The                            Colonoscope was introduced  through the anus and                            advanced to the the cecum, identified by                            appendiceal orifice and ileocecal valve. The                            colonoscopy was performed without difficulty. The                            patient tolerated the procedure well. The quality                            of the bowel preparation was good. The ileocecal                            valve, appendiceal orifice, and rectum were                            photographed. Scope In: 9:31:46 AM Scope Out: 9:50:50 AM Scope Withdrawal Time: 0 hours 14 minutes 39 seconds  Total Procedure Duration: 0 hours 19 minutes 4 seconds  Findings:                 The perianal and digital rectal examinations were                            normal.                           A 2 mm polyp was found in the ascending colon. The                            polyp was sessile. The polyp was removed with a                            cold snare. Resection and retrieval were complete.  A 4 mm polyp was found in the sigmoid colon. The                            polyp was sessile. The polyp was removed with a                            cold snare. Resection and retrieval were complete.                           Two sessile polyps were found in the rectum. The                            polyps were 3 to 4 mm in size. These polyps were                            removed with a cold snare. Resection and retrieval                            were complete.                           Multiple small-mouthed diverticula were found in                            the sigmoid colon.                           Internal hemorrhoids were found during                            retroflexion. The hemorrhoids were moderate.                           The exam was otherwise without abnormality. Complications:            No immediate complications. Estimated blood loss:                             Minimal. Estimated Blood Loss:     Estimated blood loss was minimal. Impression:               - One 2 mm polyp in the ascending colon, removed                            with a cold snare. Resected and retrieved.                           - One 4 mm polyp in the sigmoid colon, removed with                            a cold snare. Resected and retrieved.                           - Two 3 to 4 mm polyps in the rectum, removed  with                            a cold snare. Resected and retrieved.                           - Diverticulosis in the sigmoid colon.                           - Internal hemorrhoids.                           - The examination was otherwise normal. Recommendation:           - Patient has a contact number available for                            emergencies. The signs and symptoms of potential                            delayed complications were discussed with the                            patient. Return to normal activities tomorrow.                            Written discharge instructions were provided to the                            patient.                           - Resume previous diet.                           - Continue present medications.                           - Await pathology results. Remo Lipps P. Havery Moros, MD 02/23/2019 9:56:47 AM This report has been signed electronically.

## 2019-02-24 ENCOUNTER — Telehealth: Payer: Self-pay

## 2019-02-24 NOTE — Telephone Encounter (Signed)
  Follow up Call-  Call back number 02/23/2019  Post procedure Call Back phone  # [phone]  Permission to leave phone message Yes  Some recent data might be hidden     Patient questions:  Do you have a fever, pain , or abdominal swelling? No. Pain Score  0 *  Have you tolerated food without any problems? Yes.    Have you been able to return to your normal activities? Yes.    Do you have any questions about your discharge instructions: Diet   No. Medications  No. Follow up visit  No.  Do you have questions or concerns about your Care? No.  Actions: * If pain score is 4 or above: No action needed, pain <4.

## 2019-08-04 ENCOUNTER — Other Ambulatory Visit: Payer: Self-pay | Admitting: Family Medicine

## 2019-08-04 DIAGNOSIS — E042 Nontoxic multinodular goiter: Secondary | ICD-10-CM

## 2019-08-12 ENCOUNTER — Ambulatory Visit
Admission: RE | Admit: 2019-08-12 | Discharge: 2019-08-12 | Disposition: A | Discharge status: Home / Self Care | Payer: BC Managed Care – PPO | Source: Ambulatory Visit | Attending: Family Medicine | Admitting: Family Medicine

## 2019-08-12 DIAGNOSIS — E042 Nontoxic multinodular goiter: Secondary | ICD-10-CM

## 2019-08-12 IMAGING — US US THYROID
1 series · 13 of 25 positions shown · non-contrast
Comparison: Prior thyroid ultrasound [DATE]

CLINICAL DATA: Prior ultrasound follow-up. 63-year-old male with a
right mid thyroid nodule currently under annual surveillance.

EXAM:
THYROID ULTRASOUND
TECHNIQUE: Ultrasound examination of the thyroid gland and adjacent soft
tissues was performed.

[Series 1: us thyroid · 0.07mm/px · 13 of 44 slices shown]
[im 1/44]
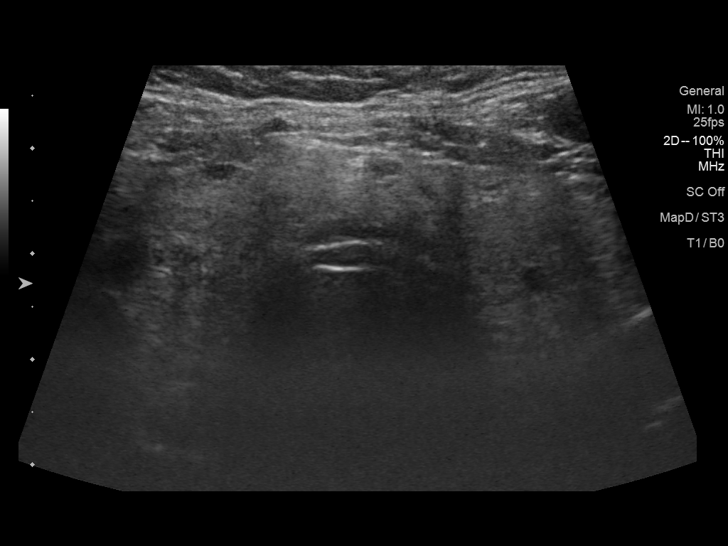
[im 4/44]
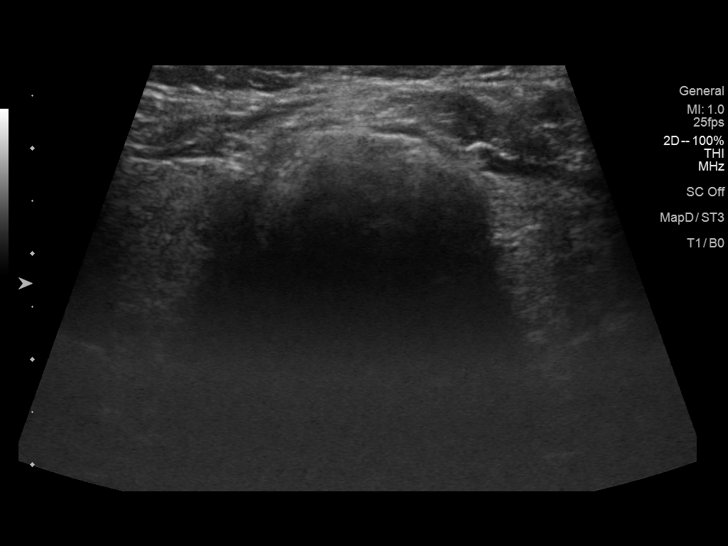
[im 8/44]
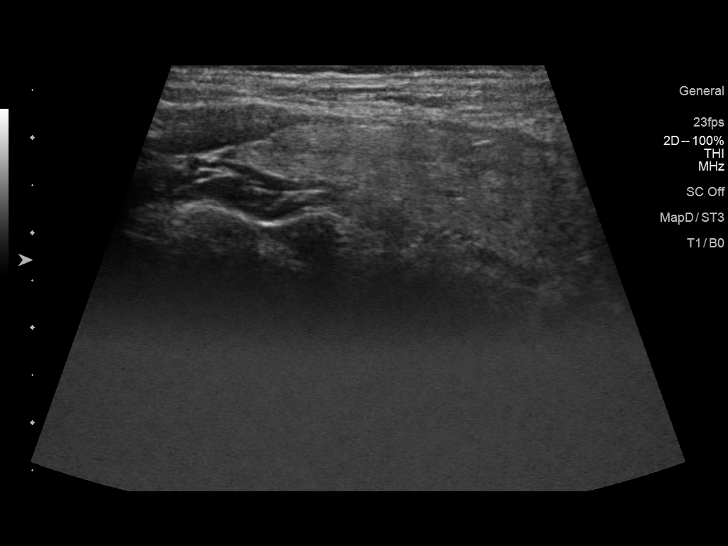
[im 11/44]
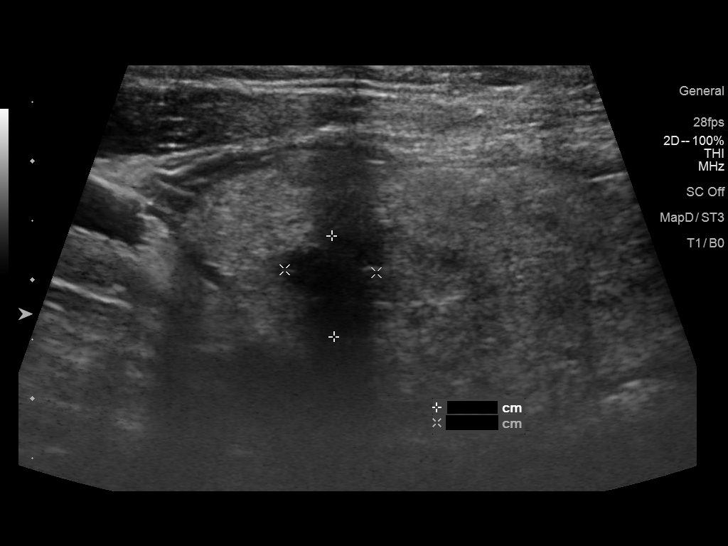
[im 15/44]
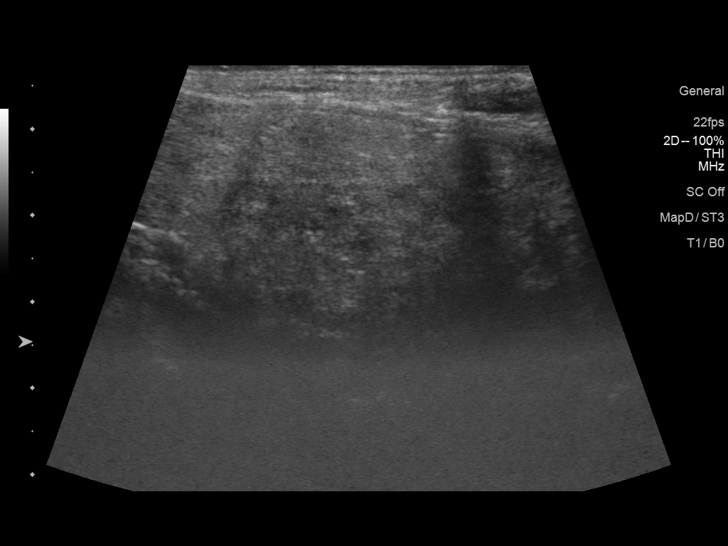
[im 18/44]
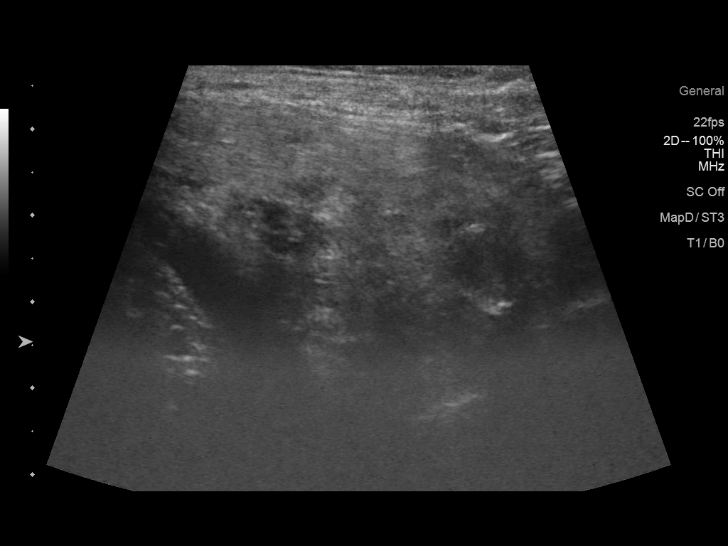
[im 22/44]
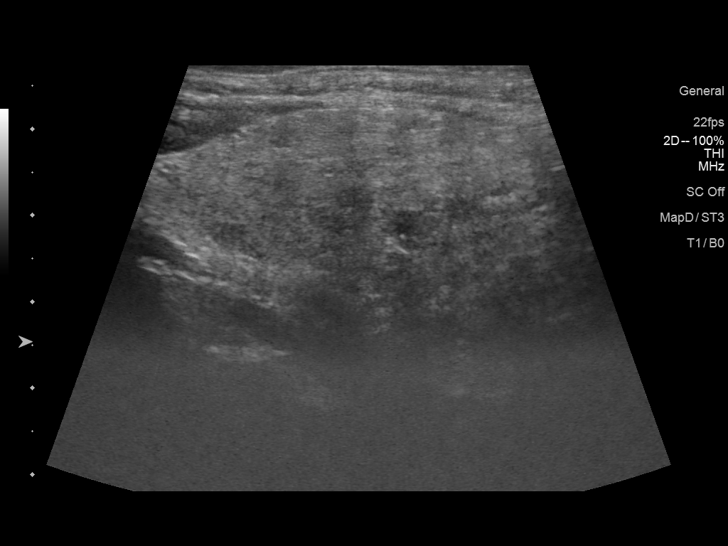
[im 26/44]
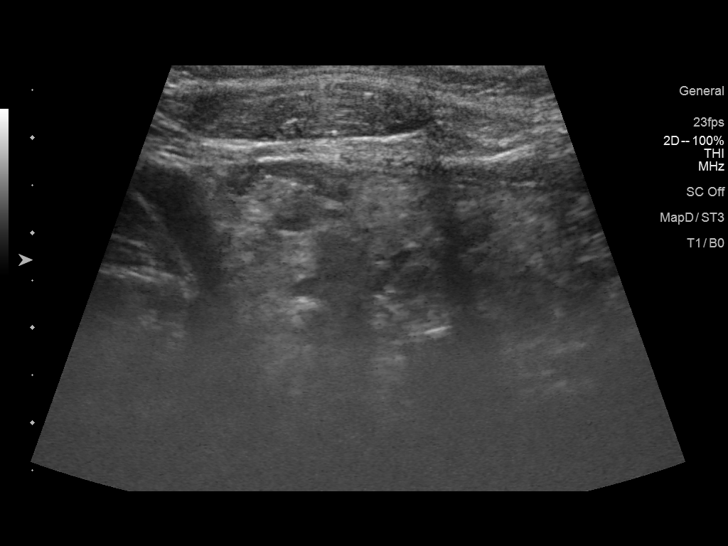
[im 29/44]
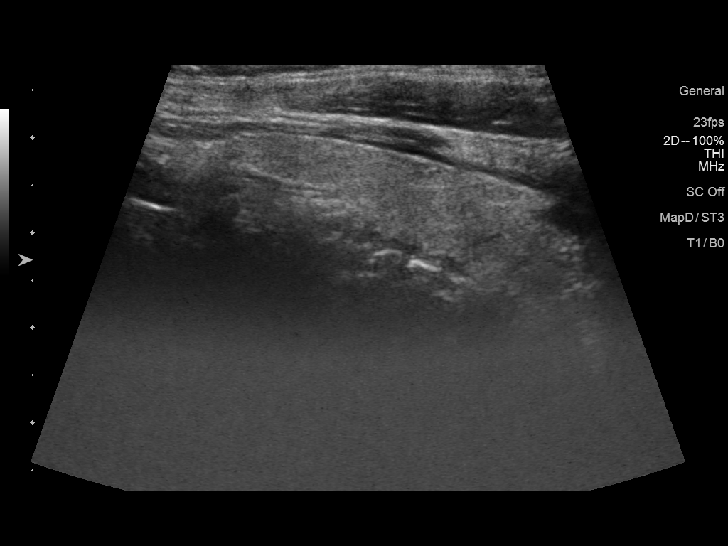
[im 33/44]
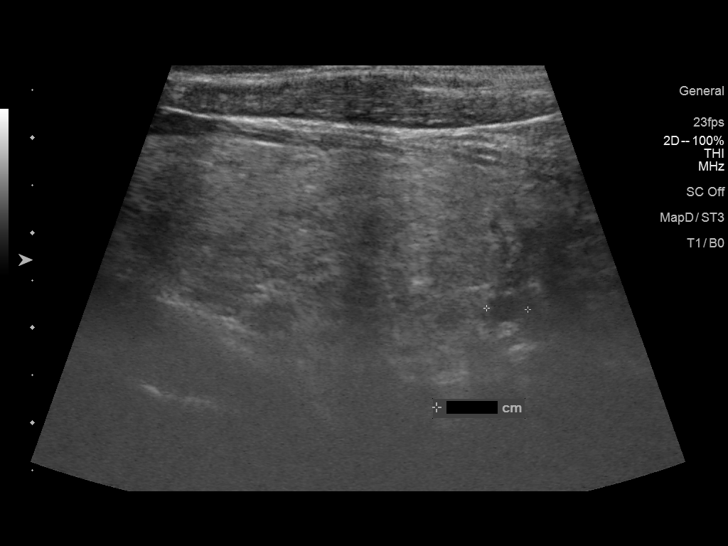
[im 36/44]
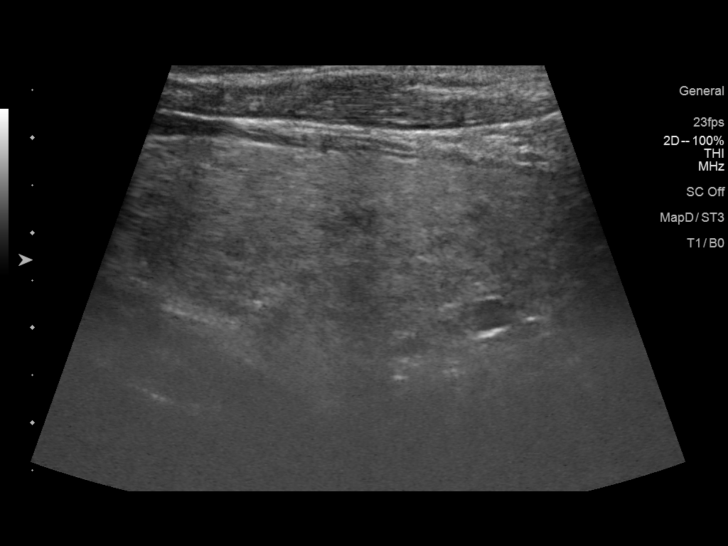
[im 40/44]
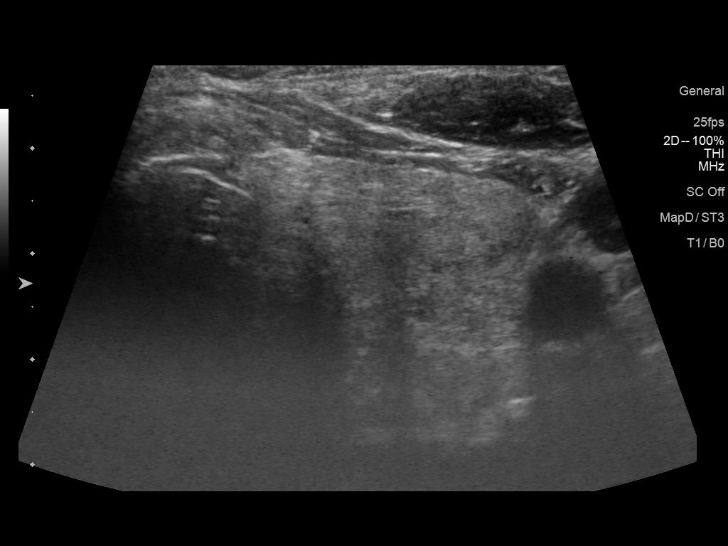
[im 44/44]
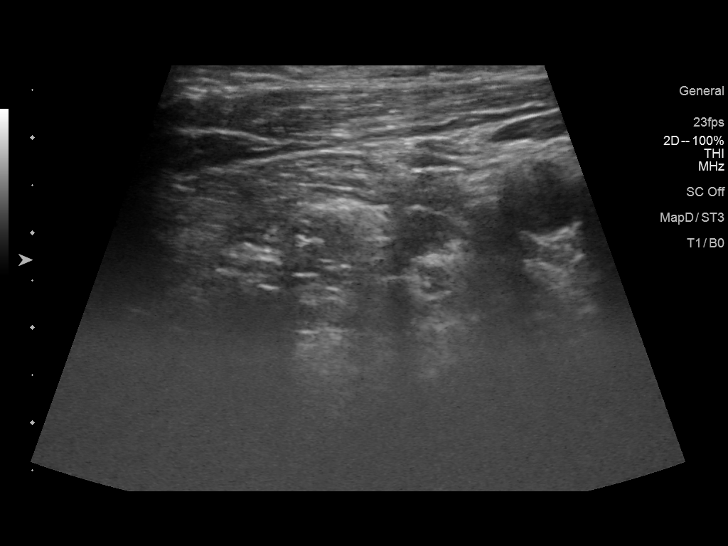

[13 of 25 positions shown; findings below may reference images not displayed]

FINDINGS: Parenchymal Echotexture: Moderately heterogenous

Isthmus: 0.9 cm

Right lobe: 7.6 x 2.5 x 3.0 cm

Left lobe: 6.3 x 2.5 x 2.1 cm

_________________________________________________________

Estimated total number of nodules >/= 1 cm: 1

Number of spongiform nodules >/=  2 cm not described below (TR1): 0

Number of mixed cystic and solid nodules >/= 1.5 cm not described
below (TR2): 0

_________________________________________________________

Nodule # 1:

Prior biopsy: No

Location: Right; Mid

Maximum size: 0.9 cm; Other 2 dimensions: 0.8 x 0.6 cm, previously,
0.8 x 0.8 x 0.7 cm

Composition: solid/almost completely solid (2)

Echogenicity: hypoechoic (2)

Shape: not taller-than-wide (0)

Margins: ill-defined (0)

Echogenic foci: peripheral calcifications (2)

ACR TI-RADS total points: 6.

ACR TI-RADS risk category:  TR4 (4-6 points).

Significant change in size (>/= 20% in two dimensions and minimal
increase of 2 mm): No

Change in features: No

Change in ACR TI-RADS risk category: Yes

ACR TI-RADS recommendations:

Given size (<0.9 cm) and appearance, this nodule does NOT meet
TI-RADS criteria for biopsy or dedicated follow-up.

_________________________________________________________

A second small hypoechoic subcentimeter nodule in the right inferior
gland demonstrates no significant interval change.
IMPRESSION: 1. Stable size of previously described TI-RADS category 5 nodule in
the right mid gland. On today's examination, the previously noted
internal punctate echogenic foci are not appreciable sonographically
which down grades the lesion to TI-RADS category 4. Being less than
1 cm in size, this lesion no longer meets criteria for annual
surveillance.
2. Additional incidental right lower pole thyroid nodule also
remains unchanged and again does not meet criteria for further
evaluation.
3. Stable enlarged and diffusely heterogeneous thyroid gland.

The above is in keeping with the ACR TI-RADS recommendations - [HOSPITAL] [EX];[DATE].

## 2019-09-21 ENCOUNTER — Other Ambulatory Visit: Payer: Self-pay

## 2019-09-21 DIAGNOSIS — Z20822 Contact with and (suspected) exposure to covid-19: Secondary | ICD-10-CM

## 2019-09-23 LAB — NOVEL CORONAVIRUS, NAA: SARS-CoV-2, NAA: NOT DETECTED

## 2020-02-05 ENCOUNTER — Other Ambulatory Visit: Payer: Self-pay | Admitting: Family Medicine

## 2020-02-05 DIAGNOSIS — R109 Unspecified abdominal pain: Secondary | ICD-10-CM

## 2020-02-11 ENCOUNTER — Ambulatory Visit
Admission: RE | Admit: 2020-02-11 | Discharge: 2020-02-11 | Disposition: A | Discharge status: Home / Self Care | Payer: BC Managed Care – PPO | Source: Ambulatory Visit | Attending: Family Medicine | Admitting: Family Medicine

## 2020-02-11 DIAGNOSIS — R109 Unspecified abdominal pain: Secondary | ICD-10-CM

## 2020-02-11 IMAGING — US US ABDOMEN COMPLETE
1 series · 13 of 25 positions shown · non-contrast
Comparison: None.

CLINICAL DATA: Abdominal pain for several months

EXAM:
ABDOMEN ULTRASOUND COMPLETE

[Series 1: us abdomen complete · 0.19mm/px · 13 of 81 slices shown]
[im 1/81]
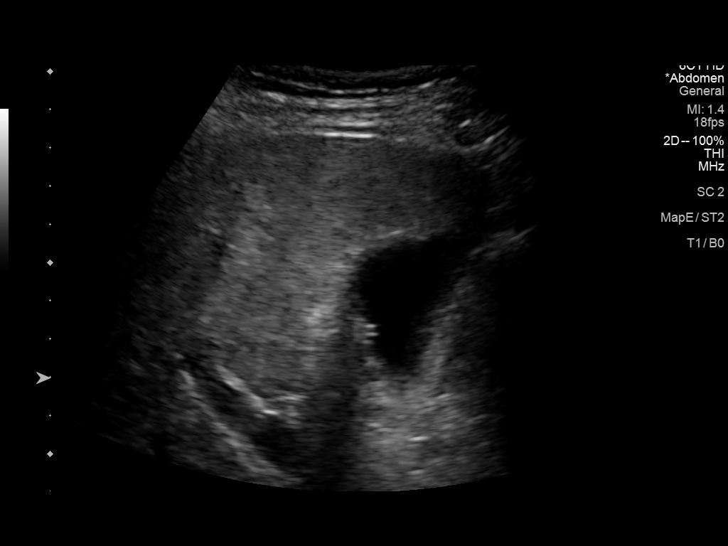
[im 7/81]
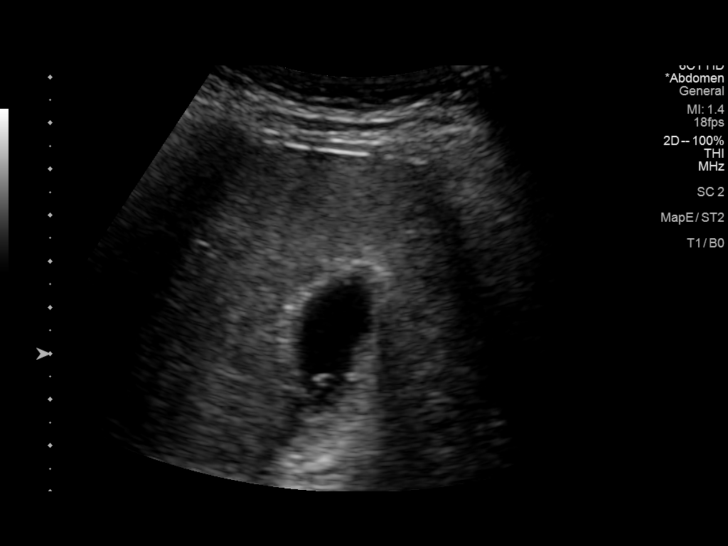
[im 14/81]
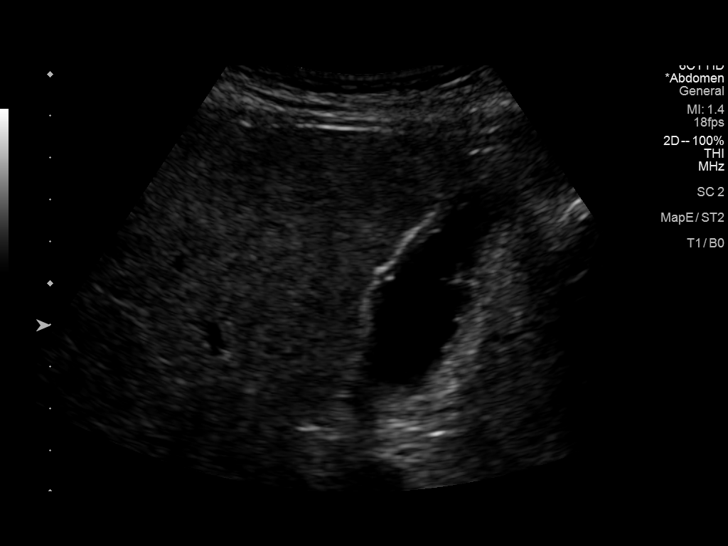
[im 21/81]
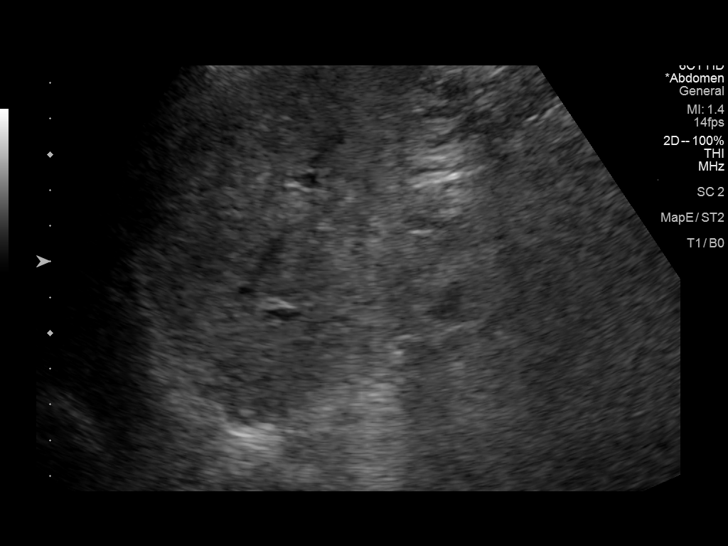
[im 27/81]
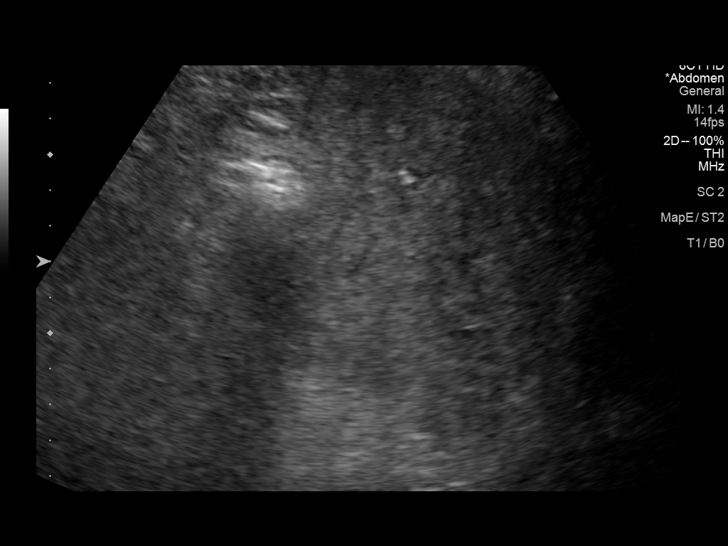
[im 34/81]
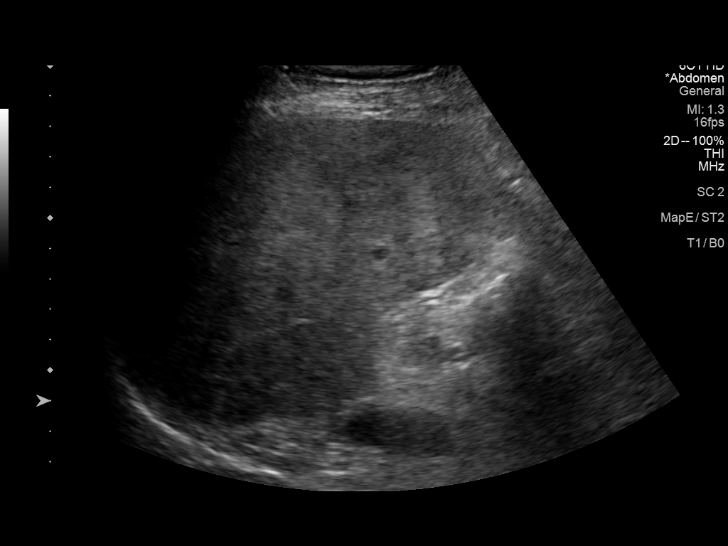
[im 41/81]
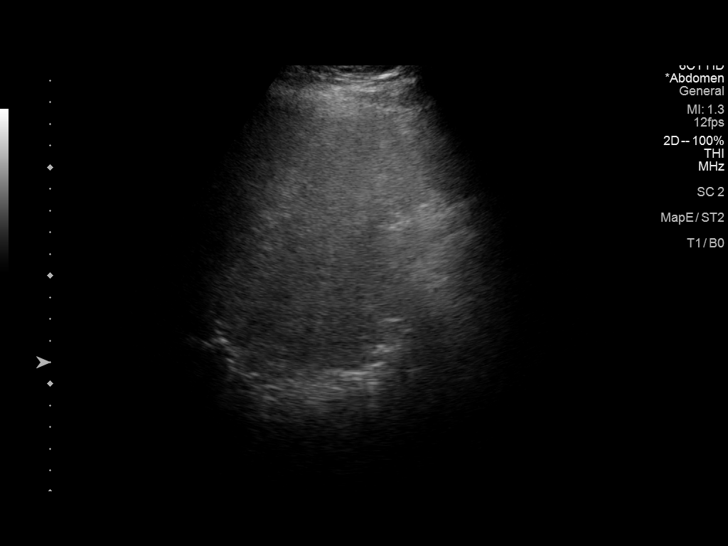
[im 47/81]
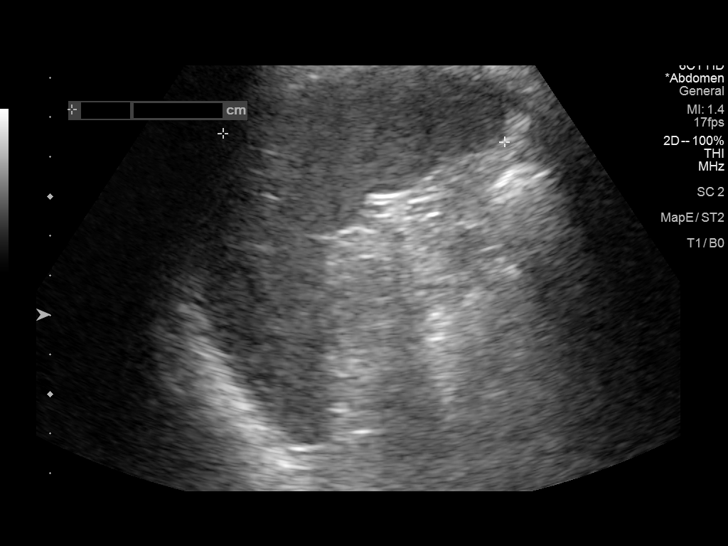
[im 54/81]
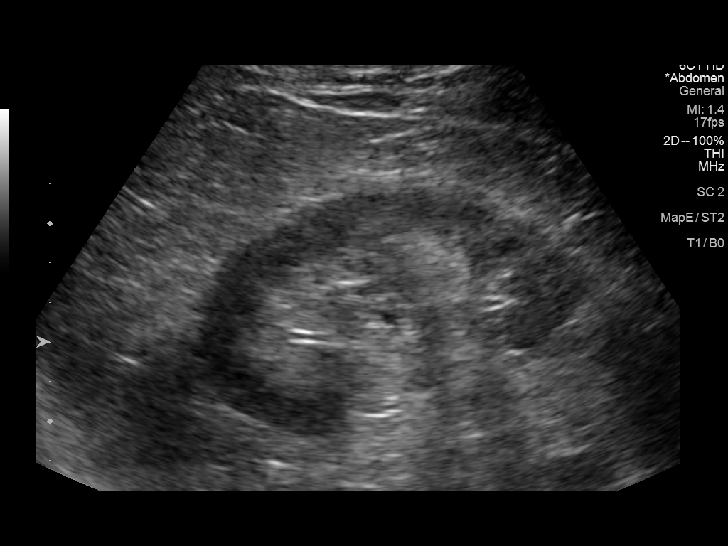
[im 61/81]
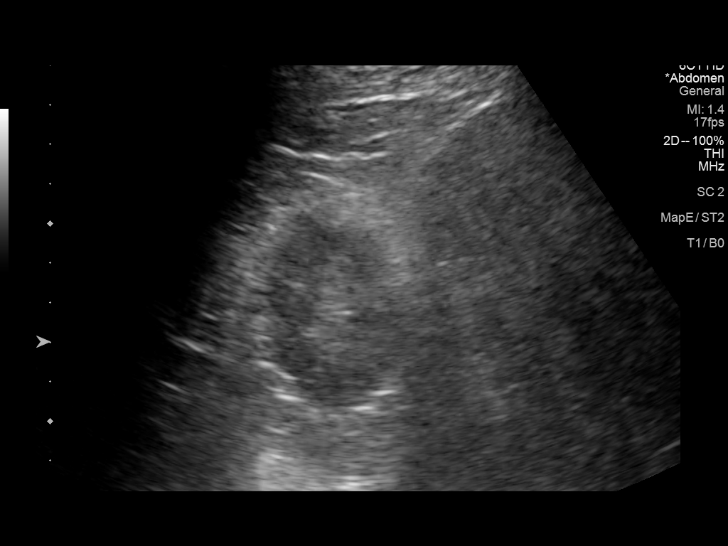
[im 67/81]
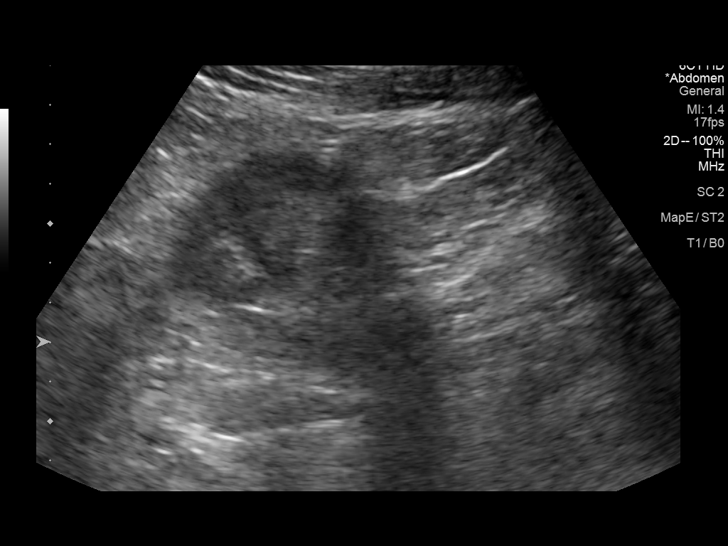
[im 74/81]
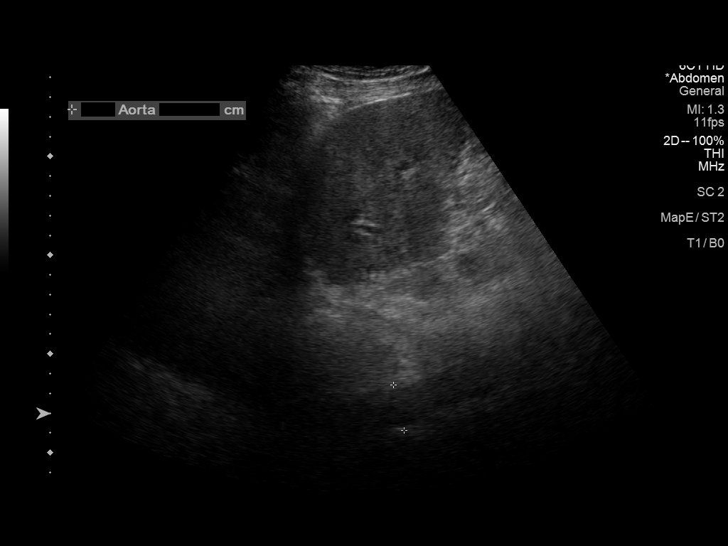
[im 81/81]
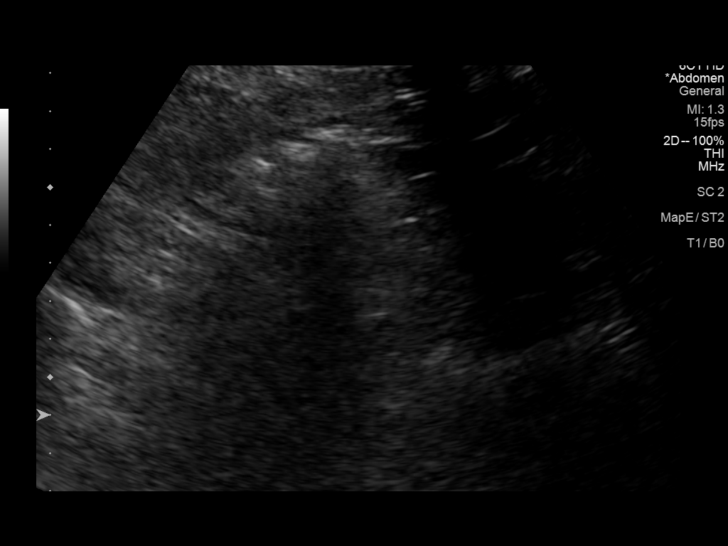

[13 of 25 positions shown; findings below may reference images not displayed]

FINDINGS: Gallbladder: Gallbladder is partially distended with small
gallstones and apparent gallbladder polyps. The wall is thickened at
4 mm although felt to be artificially thickened due to the
nondistended state. Negative sonographic Murphy's sign is elicited.

Common bile duct: Diameter: 3.9 mm.

Liver: Heterogeneous and diffusely increased in echogenicity
consistent with fatty infiltration. Portal vein is patent on color
Doppler imaging with normal direction of blood flow towards the
liver.

IVC: No abnormality visualized.

Pancreas: Obscured by overlying bowel gas.

Spleen: Size and appearance within normal limits.

Right Kidney: Length: 11.0 cm. Echogenicity within normal limits. No
mass or hydronephrosis visualized.

Left Kidney: Length: 11.3 cm. Echogenicity within normal limits. No
mass or hydronephrosis visualized.

Abdominal aorta: No aneurysm visualized.

Other findings: None.
IMPRESSION: Somewhat limited exam due to overlying bowel gas.

Fatty infiltration of the liver.

Incompletely distended gallbladder with likely artificially
thickened wall. Gallstones and gallbladder polyps are noted.

## 2020-02-27 ENCOUNTER — Ambulatory Visit: Payer: BC Managed Care – PPO

## 2020-03-03 ENCOUNTER — Ambulatory Visit: Payer: Self-pay | Admitting: General Surgery

## 2020-04-11 ENCOUNTER — Ambulatory Visit: Payer: BC Managed Care – PPO | Attending: Internal Medicine

## 2020-04-11 DIAGNOSIS — Z20822 Contact with and (suspected) exposure to covid-19: Secondary | ICD-10-CM

## 2020-04-12 LAB — NOVEL CORONAVIRUS, NAA: SARS-CoV-2, NAA: NOT DETECTED

## 2020-04-12 LAB — SARS-COV-2, NAA 2 DAY TAT

## 2020-04-14 NOTE — Patient Instructions (Addendum)
DUE TO COVID-19 ONLY ONE VISITOR IS ALLOWED TO COME WITH YOU AND STAY IN THE WAITING ROOM ONLY DURING PRE OP AND PROCEDURE DAY OF SURGERY. THE 1 VISITOR MAY VISIT WITH YOU AFTER SURGERY IN YOUR PRIVATE ROOM DURING VISITING HOURS ONLY!  YOU NEED TO HAVE A COVID 19 TEST ON    4/26_ @_8 :45______, THIS TEST MUST BE DONE BEFORE SURGERY, COME  [place], [place] , [id number].  ([place]) ONCE YOUR COVID TEST IS COMPLETED, PLEASE BEGIN THE QUARANTINE INSTRUCTIONS AS OUTLINED IN YOUR HANDOUT.                Jeffrey Hanson    Your procedure is scheduled on: 04/27/20   Report to [facility] Main  Entrance   Report to Short Stay at 5:30 AM     Call this number if you have problems the morning of surgery [place], NO CHEWING GUM [place].     Take these medicines the morning of surgery with A SIP OF WATER: Zyrtec, Nexium, Xanax if needed  Do not eat food After Midnight.   YOU MAY HAVE CLEAR LIQUIDS FROM MIDNIGHT UNTIL 4:30AM.   At 4:30AM Please finish the prescribed Pre-Surgery  drink.   Nothing by mouth after you finish the drink !                               You may not have any metal on your body including              piercings  Do not wear jewelry,  lotions, powders or deodorant                       Men may shave face and neck.   Do not bring valuables to the hospital. [place].  Contacts, dentures or bridgework may not be worn into surgery.       Patients discharged the day of surgery will not be allowed to drive home.   IF YOU ARE HAVING SURGERY AND GOING HOME THE SAME DAY, YOU MUST HAVE AN ADULT TO DRIVE YOU HOME AND BE WITH YOU FOR 24 HOURS.   YOU MAY GO HOME BY TAXI OR UBER OR ORTHERWISE, BUT AN ADULT MUST ACCOMPANY YOU HOME AND STAY WITH YOU FOR 24 HOURS.  Name and phone number of your driver:  Special  Instructions: N/A              Please read over the following fact sheets you were given: _____________________________________________________________________             [facility] - Preparing for Surgery Before surgery, you can play an important role.   Because skin is not sterile, your skin needs to be as free of germs as possible .  You can reduce the number of germs on your skin by washing with CHG (chlorahexidine gluconate) soap before surgery.   CHG is an antiseptic cleaner which kills germs and bonds with the skin to continue killing germs even after washing. Please DO NOT use if you have an allergy to CHG or antibacterial soaps .  If your skin becomes reddened/irritated stop using the CHG and inform your nurse  when you arrive at Short Stay. .  You may shave your face/neck.  Please follow these instructions carefully:  1.  Shower with CHG Soap the night before surgery and the  morning of Surgery.  2.  If you choose to wash your hair, wash your hair first as usual with your  normal  shampoo.  3.  After you shampoo, rinse your hair and body thoroughly to remove the  shampoo.                                        4.  Use CHG as you would any other liquid soap.  You can apply chg directly  to the skin and wash                       Gently with a scrungie or clean washcloth.  5.  Apply the CHG Soap to your body ONLY FROM THE NECK DOWN.   Do not use on face/ open                           Wound or open sores. Avoid contact with eyes, ears mouth and genitals (private parts).                       Wash face,  Genitals (private parts) with your normal soap.             6.  Wash thoroughly, paying special attention to the area where your surgery  will be performed.  7.  Thoroughly rinse your body with warm water from the neck down.  8.  DO NOT shower/wash with your normal soap after using and rinsing off  the CHG Soap.             9.  Pat yourself dry with a clean towel.            10.   Wear clean pajamas.            11.  Place clean sheets on your bed the night of your first shower and do not  sleep with pets. Day of Surgery : Do not apply any lotions/deodorants the morning of surgery.  Please wear clean clothes to the hospital/surgery center.  FAILURE TO FOLLOW THESE INSTRUCTIONS MAY RESULT IN THE CANCELLATION OF YOUR SURGERY PATIENT SIGNATURE_________________________________  NURSE SIGNATURE__________________________________  ________________________________________________________________________

## 2020-04-15 ENCOUNTER — Encounter (HOSPITAL_COMMUNITY)
Admission: RE | Admit: 2020-04-15 | Discharge: 2020-04-15 | Disposition: A | Discharge status: Home / Self Care | Payer: BC Managed Care – PPO | Source: Ambulatory Visit | Attending: General Surgery | Admitting: General Surgery

## 2020-04-15 NOTE — Progress Notes (Signed)
I called Pt for PAT visit and he said the he cancelled his surgery and will have to reschedule after May

## 2020-04-25 ENCOUNTER — Other Ambulatory Visit (HOSPITAL_COMMUNITY): Payer: BC Managed Care – PPO

## 2020-04-25 ENCOUNTER — Encounter (HOSPITAL_COMMUNITY): Admission: RE | Admit: 2020-04-25 | Payer: BC Managed Care – PPO | Source: Ambulatory Visit

## 2020-04-27 ENCOUNTER — Encounter (HOSPITAL_COMMUNITY): Admission: RE | Payer: Self-pay | Source: Home / Self Care

## 2020-04-27 ENCOUNTER — Inpatient Hospital Stay (HOSPITAL_COMMUNITY): Admission: RE | Admit: 2020-04-27 | Payer: BC Managed Care – PPO | Source: Home / Self Care | Admitting: General Surgery

## 2020-04-27 SURGERY — LAPAROSCOPIC CHOLECYSTECTOMY
Anesthesia: General

## 2020-04-28 DIAGNOSIS — K8020 Calculus of gallbladder without cholecystitis without obstruction: Secondary | ICD-10-CM | POA: Insufficient documentation

## 2020-04-28 DIAGNOSIS — K449 Diaphragmatic hernia without obstruction or gangrene: Secondary | ICD-10-CM | POA: Insufficient documentation

## 2020-05-04 ENCOUNTER — Telehealth: Payer: Self-pay

## 2020-05-04 DIAGNOSIS — R1110 Vomiting, unspecified: Secondary | ICD-10-CM

## 2020-05-04 DIAGNOSIS — R109 Unspecified abdominal pain: Secondary | ICD-10-CM

## 2020-05-04 DIAGNOSIS — K219 Gastro-esophageal reflux disease without esophagitis: Secondary | ICD-10-CM

## 2020-05-04 DIAGNOSIS — K449 Diaphragmatic hernia without obstruction or gangrene: Secondary | ICD-10-CM

## 2020-05-04 NOTE — Telephone Encounter (Signed)
Instructions for EGD sent to Mychart and mailed to patient with consent to mail back.

## 2020-05-04 NOTE — Telephone Encounter (Signed)
Per Armbruster - direct book for EGD. Surgeon is requesting for pre-op evaluation. Called and scheduled pt for Wed. May 12th at 10:00am. No BT, not DM, covid vaccinated.  Patient requested instructions sent to Mychart and will call back with questions.  He understands to arrive at 9:00am and must have a care partner. Mailing consent to pt to sign and mail back.

## 2020-05-05 ENCOUNTER — Encounter: Payer: Self-pay | Admitting: Gastroenterology

## 2020-05-11 ENCOUNTER — Ambulatory Visit (AMBULATORY_SURGERY_CENTER): Payer: Medicare PPO | Admitting: Gastroenterology

## 2020-05-11 ENCOUNTER — Encounter: Payer: Self-pay | Admitting: Gastroenterology

## 2020-05-11 ENCOUNTER — Other Ambulatory Visit: Payer: Self-pay

## 2020-05-11 VITALS — BP 115/73 | HR 79 | Temp 96.8°F | Resp 21 | Ht 71.0 in | Wt 202.0 lb

## 2020-05-11 DIAGNOSIS — K2950 Unspecified chronic gastritis without bleeding: Secondary | ICD-10-CM

## 2020-05-11 DIAGNOSIS — K3189 Other diseases of stomach and duodenum: Secondary | ICD-10-CM

## 2020-05-11 DIAGNOSIS — K449 Diaphragmatic hernia without obstruction or gangrene: Secondary | ICD-10-CM | POA: Diagnosis not present

## 2020-05-11 DIAGNOSIS — R109 Unspecified abdominal pain: Secondary | ICD-10-CM

## 2020-05-11 DIAGNOSIS — K317 Polyp of stomach and duodenum: Secondary | ICD-10-CM | POA: Diagnosis not present

## 2020-05-11 MED ORDER — SODIUM CHLORIDE 0.9 % IV SOLN: 500.0000 mL | INTRAVENOUS | Status: DC

## 2020-05-11 NOTE — Op Note (Addendum)
[place] Patient Name: Jeffrey Hanson Procedure Date: 05/11/2020 10:02 AM MRN: [id number] Endoscopist: Remo Lipps P. Havery Moros , MD Age: 64 Referring MD:  Date of Birth: 1956/02/13 Gender: Male Account #: [id number] Procedure:                Upper GI endoscopy Indications:              Epigastric abdominal pain, Early satiety -                            gallstones on [place], surgeon asking for EGD to clear                            stomach prior to cholecystectomy. On nexium 20mg                             once daily which controls reflux symptoms Medicines:                Monitored Anesthesia Care Procedure:                Pre-Anesthesia Assessment:                           - Prior to the procedure, a History and Physical                            was performed, and patient medications and                            allergies were reviewed. The patient's tolerance of                            previous anesthesia was also reviewed. The risks                            and benefits of the procedure and the sedation                            options and risks were discussed with the patient.                            All questions were answered, and informed consent                            was obtained. Prior Anticoagulants: The patient has                            taken no previous anticoagulant or antiplatelet                            agents. ASA Grade Assessment: II - A patient with                            mild systemic disease. After reviewing the risks  and benefits, the patient was deemed in                            satisfactory condition to undergo the procedure.                           After obtaining informed consent, the endoscope was                            passed under direct vision. Throughout the                            procedure, the patient's blood pressure, pulse, and                            oxygen saturations  were monitored continuously. The                            Endoscope was introduced through the mouth, and                            advanced to the second part of duodenum. The upper                            GI endoscopy was accomplished without difficulty.                            The patient tolerated the procedure well. Scope In: Scope Out: Findings:                 Esophagogastric landmarks were identified: the                            Z-line was found at 34 cm, the gastroesophageal                            junction was found at 34 cm and the upper extent of                            the gastric folds was found at 40 cm from the                            incisors.                           A large 6 cm hiatal hernia was present.                           The esophagus was difficult to insufflate and                            retain air due to the large hernia, but there is a  suggestion of 2 columns of esophageal varices were                            found in the lower third of the esophagus without                            stigmata of bleeding.                           The exam of the esophagus was otherwise normal.                           A single large semi-sessile polyp was found in the                            gastric fundus - diffusely ulcerated and inflamed,                            right at the [facility] on the lesser curvature at the                            beginning of the hernia sac. Suspect inflammatory                            in nature but biopsies were taken with a cold                            forceps for histology, rule out adenoma.                           Multiple small sessile polyps were found in the                            gastric fundus and in the gastric body,                            inflammatory / hyperplastic in appearance.                           Diffuse inflammation characterized by erythema,                             friability and granularity was found in the entire                            examined stomach. Biopsies were taken with a cold                            forceps for histology and to rule out H pylori.                           The exam of the stomach was otherwise normal.  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 Biopsies were taken with a cold forceps for                            histology.                           Four 1 to 3 mm sessile polyps were found in the                            duodenal bulb. These polyps were removed with a                            cold biopsy forceps. Resection and retrieval were                            complete.                           The exam of the duodenum was otherwise normal. Complications:            No immediate complications. Estimated blood loss:                            Minimal. Estimated Blood Loss:     Estimated blood loss: none. Estimated blood loss                            was minimal. Impression:               - Esophagogastric landmarks identified.                           - 6 cm hiatal hernia.                           - Possible 2 columns of esophageal varices in the                            distal esophagus. No known history of cirrhosis or                            portal hypertension - esophagus was difficult to                            insufflate due to large hernia, could be prominent                            folds however concerned this more likely be varices.                           -  A single large gastric polyp as described at                            beginning of the hernia sac, suspect inflammatory                            in nature. Biopsied. Will need removal at the                            hospital due to risk of bleeding.                            - Multiple benign appearing gastric polyps.                           - Gastritis. Biopsied.                           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 Biopsied.                           - Four duodenal polyps. Resected and retrieved. Recommendation:           - Patient has a contact number available for                            emergencies. The signs and symptoms of potential                            delayed complications were discussed with the                            patient. Return to normal activities tomorrow.                            Written discharge instructions were provided to the                            patient.                           - Resume previous diet.                           - Continue present medications.                           - Increase nexium to twice daily                           - Avoid NSAIDs                           -  Await pathology results.                           - Recommend CT chest / abdomen / pelvis to assess                            for portal hypertension and varices based on EGD                            result                           - Would hold off on cholecystectomy until this                            workup is done, symptoms could be related to large                            hiatal hernia, need to rule out portal hypertension                            prior to surgery Remo Lipps P. Vicenta Olds, MD 05/11/2020 10:47:18 AM This report has been signed electronically.

## 2020-05-11 NOTE — Patient Instructions (Signed)
Discharge instructions given. Handouts on a hiatal hernia and gastritis. Contrast given in recovery room for CT CHEST/ABDOMIEN/[place] to assess for portal hypertension and varcies based on EGD result. Office will call to schedule. Avoid NSAIDS. Increase nexium to twice a day. Resume previous medications.  YOU HAD AN ENDOSCOPIC PROCEDURE TODAY AT [facility]:   Refer to the procedure report that was given to you for any specific questions about what was found during the examination.  If the procedure report does not answer your questions, please call your gastroenterologist to clarify.  If you requested that your care partner not be given the details of your procedure findings, then the procedure report has been included in a sealed envelope for you to review at your convenience later.  YOU SHOULD EXPECT: Some feelings of bloating in the abdomen. Passage of more gas than usual.  Walking can help get rid of the air that was put into your GI tract during the procedure and reduce the bloating. If you had a lower endoscopy (such as a colonoscopy or flexible sigmoidoscopy) you may notice spotting of blood in your stool or on the toilet paper. If you underwent a bowel prep for your procedure, you may not have a normal bowel movement for a few days.  Please Note:  You might notice some irritation and congestion in your nose or some drainage.  This is from the oxygen used during your procedure.  There is no need for concern and it should clear up in a day or so.  SYMPTOMS TO REPORT IMMEDIATELY:   Following upper endoscopy (EGD)  Vomiting of blood or coffee ground material  New chest pain or pain under the shoulder blades  Painful or persistently difficult swallowing  New shortness of breath  Fever of 100F or higher  Black, tarry-looking stools  For urgent or emergent issues, a gastroenterologist can be reached at any hour by calling [phone]. Do not use MyChart messaging for  urgent concerns.    DIET:  We do recommend a small meal at first, but then you may proceed to your regular diet.  Drink plenty of fluids but you should avoid alcoholic beverages for 24 hours.  ACTIVITY:  You should plan to take it easy for the rest of today and you should NOT DRIVE or use heavy machinery until tomorrow (because of the sedation medicines used during the test).    FOLLOW UP: Our staff will call the number listed on your records 48-72 hours following your procedure to check on you and address any questions or concerns that you may have regarding the information given to you following your procedure. If we do not reach you, we will leave a message.  We will attempt to reach you two times.  During this call, we will ask if you have developed any symptoms of COVID 19. If you develop any symptoms (ie: fever, flu-like symptoms, shortness of breath, cough etc.) before then, please call [phone].  If you test positive for Covid 19 in the 2 weeks post procedure, please call and report this information to [place].    If any biopsies were taken you will be contacted by phone or by letter within the next 1-3 weeks.  Please call us at [phone] if you have not heard about the biopsies in 3 weeks.    SIGNATURES/CONFIDENTIALITY: You and/or your care partner have signed paperwork which will be entered into your electronic medical record.  These signatures attest to the fact  that that the information above on your After Visit Summary has been reviewed and is understood.  Full responsibility of the confidentiality of this discharge information lies with you and/or your care-partner.

## 2020-05-11 NOTE — Progress Notes (Signed)
Called to room to assist during endoscopic procedure.  Patient ID and intended procedure confirmed with present staff. Received instructions for my participation in the procedure from the performing physician.  

## 2020-05-11 NOTE — Progress Notes (Signed)
Patient has been having epigastric pain postprandially and early satiety. No dysphagia. Has gallstones. Surgeon requesting EGD prior to cholecystectomy which is reasonable. Otherwise feels well today, no cardiopulmonary complaints, wants to proceed with this exam. No prior EGD.

## 2020-05-11 NOTE — Progress Notes (Signed)
To PACU, VSS. Report to [email or web address] 

## 2020-05-12 ENCOUNTER — Telehealth: Payer: Self-pay | Admitting: Gastroenterology

## 2020-05-12 DIAGNOSIS — R198 Other specified symptoms and signs involving the digestive system and abdomen: Secondary | ICD-10-CM

## 2020-05-12 DIAGNOSIS — R109 Unspecified abdominal pain: Secondary | ICD-10-CM

## 2020-05-12 NOTE — Telephone Encounter (Signed)
Jeffrey Flock, MD sent to Jeffrey Pel, RN  Jeffrey Hanson this patient needs CT chest / abdomen / pelvis with PO and IV contrast for further evaluation of abnormal EGD findings, rule out portal hypertension / varices. I don't see any creatinine on file for him, he will need this checked pre-CT. Thank you

## 2020-05-12 NOTE — Telephone Encounter (Signed)
Patient notified of CT scan scan of chest, abdomen, pelvis for 5/21 2:30 at [facility].  He will come for labs between now and next week

## 2020-05-13 ENCOUNTER — Telehealth: Payer: Self-pay

## 2020-05-13 NOTE — Telephone Encounter (Signed)
Covid-19 screening questions   Do you now or have you had a fever in the last 14 days? No.  Do you have any respiratory symptoms of shortness of breath or cough now or in the last 14 days? No. Do you have any family members or close contacts with diagnosed or suspected Covid-19 in the past 14 days? No.  Have you been tested for Covid-19 and found to be positive? No.       Follow up Call-  Call back number 05/11/2020 02/23/2019  Post procedure Call Back phone  # [phone]  Permission to leave phone message Yes Yes  Some recent data might be hidden     Patient questions:  Do you have a fever, pain , or abdominal swelling? No. Pain Score  0 *  Have you tolerated food without any problems? Yes.    Have you been able to return to your normal activities? Yes.    Do you have any questions about your discharge instructions: Diet   No. Medications  No. Follow up visit  No.  Do you have questions or concerns about your Care? No.  Actions: * If pain score is 4 or above: No action needed, pain <4.

## 2020-05-16 ENCOUNTER — Telehealth: Payer: Self-pay | Admitting: Gastroenterology

## 2020-05-16 NOTE — Telephone Encounter (Signed)
Dr. Havery Moros, have you reviewed his pathology?  I do not see a letter

## 2020-05-16 NOTE — Telephone Encounter (Signed)
Patient notified of Dr. Doyne Keel plan

## 2020-05-16 NOTE — Telephone Encounter (Signed)
Thanks Sheri I'm just seeing the results today.  I will send him a Mychart message to relay the results, and I am going to discuss his case with Dr. Rush Landmark about removal of large stomach polyps at the hospital, and await his CT scans which are scheduled in the upcoming few days. thanks

## 2020-05-17 ENCOUNTER — Other Ambulatory Visit (INDEPENDENT_AMBULATORY_CARE_PROVIDER_SITE_OTHER): Payer: Medicare PPO

## 2020-05-17 ENCOUNTER — Telehealth: Payer: Self-pay | Admitting: Gastroenterology

## 2020-05-17 ENCOUNTER — Telehealth: Payer: Self-pay

## 2020-05-17 DIAGNOSIS — R109 Unspecified abdominal pain: Secondary | ICD-10-CM | POA: Diagnosis not present

## 2020-05-17 DIAGNOSIS — K317 Polyp of stomach and duodenum: Secondary | ICD-10-CM

## 2020-05-17 DIAGNOSIS — R198 Other specified symptoms and signs involving the digestive system and abdomen: Secondary | ICD-10-CM

## 2020-05-17 LAB — BUN: BUN: 11 mg/dL (ref 6–23)

## 2020-05-17 LAB — CREATININE, SERUM: Creatinine, Ser: 0.8 mg/dL (ref 0.40–1.50)

## 2020-05-17 NOTE — Telephone Encounter (Signed)
Some surgeons will request that if considering a hiatal hernia repair but we need more information from the CT scan, etc prior to recommending any repair of the hernia. Will take the results from his CT first and make recommendations based on that. No plans for manometry right now. thanks

## 2020-05-17 NOTE — Telephone Encounter (Signed)
-----   Message from Yetta Flock, MD sent at 05/16/2020  9:32 PM EDT ----- Regarding: lab request Jan can you send a message to this patient asking him to go to our lab for CBC and TIBC / ferritin panel? Want to check for anemia / iron deficiency in light of his large gastric polyp. thanks

## 2020-05-17 NOTE — Telephone Encounter (Signed)
Patient notified of Dr, Armbruster's response

## 2020-05-17 NOTE — Telephone Encounter (Signed)
Patient called requesting to speak with nurse to clarify some things in reference to a manometry that was suppose to be scheduled along with endo please advise

## 2020-05-17 NOTE — Telephone Encounter (Signed)
Dr. Havery Moros,  Is this patient to have a mano? If yes diagnosis please.

## 2020-05-18 ENCOUNTER — Other Ambulatory Visit (INDEPENDENT_AMBULATORY_CARE_PROVIDER_SITE_OTHER): Payer: Medicare PPO

## 2020-05-18 ENCOUNTER — Encounter: Payer: Self-pay | Admitting: Gastroenterology

## 2020-05-18 DIAGNOSIS — K317 Polyp of stomach and duodenum: Secondary | ICD-10-CM

## 2020-05-18 LAB — CBC WITH DIFFERENTIAL/PLATELET
Basophils Absolute: 0 10*3/uL (ref 0.0–0.1)
Basophils Relative: 0.7 % (ref 0.0–3.0)
Eosinophils Absolute: 0.2 10*3/uL (ref 0.0–0.7)
Eosinophils Relative: 4.2 % (ref 0.0–5.0)
HCT: 46.6 % (ref 39.0–52.0)
Hemoglobin: 15.9 g/dL (ref 13.0–17.0)
Lymphocytes Relative: 35 % (ref 12.0–46.0)
Lymphs Abs: 2.1 10*3/uL (ref 0.7–4.0)
MCHC: 34.2 g/dL (ref 30.0–36.0)
MCV: 94.3 fl (ref 78.0–100.0)
Monocytes Absolute: 0.8 10*3/uL (ref 0.1–1.0)
Monocytes Relative: 14.1 % — ABNORMAL HIGH (ref 3.0–12.0)
Neutro Abs: 2.7 10*3/uL (ref 1.4–7.7)
Neutrophils Relative %: 46 % (ref 43.0–77.0)
Platelets: 207 10*3/uL (ref 150.0–400.0)
RBC: 4.95 Mil/uL (ref 4.22–5.81)
RDW: 14.7 % (ref 11.5–15.5)
WBC: 5.9 10*3/uL (ref 4.0–10.5)

## 2020-05-18 LAB — IBC + FERRITIN
Ferritin: 90.4 ng/mL (ref 22.0–322.0)
Iron: 120 ug/dL (ref 42–165)
Saturation Ratios: 34.7 % (ref 20.0–50.0)
Transferrin: 247 mg/dL (ref 212.0–360.0)

## 2020-05-20 ENCOUNTER — Ambulatory Visit (INDEPENDENT_AMBULATORY_CARE_PROVIDER_SITE_OTHER)
Admission: RE | Admit: 2020-05-20 | Discharge: 2020-05-20 | Disposition: A | Discharge status: Home / Self Care | Payer: Medicare PPO | Source: Ambulatory Visit | Attending: Gastroenterology | Admitting: Gastroenterology

## 2020-05-20 DIAGNOSIS — R198 Other specified symptoms and signs involving the digestive system and abdomen: Secondary | ICD-10-CM | POA: Diagnosis not present

## 2020-05-20 DIAGNOSIS — R109 Unspecified abdominal pain: Secondary | ICD-10-CM

## 2020-05-20 IMAGING — CT CT CHEST W/ CM
2 of 5 series · 14 of 46 positions shown, 16 images · IV contrast (OMNIPAQUE 300)
Comparison: CT chest, [DATE], thyroid ultrasound, [DATE]

CLINICAL DATA: Epigastric pain, EGD, evaluate for portal
hypertension

EXAM:
CT CHEST, ABDOMEN, AND PELVIS WITH CONTRAST
TECHNIQUE: Multidetector CT imaging of the chest, abdomen and pelvis was
performed following the standard protocol during bolus
administration of intravenous contrast.
CONTRAST:  100mL OMNIPAQUE IOHEXOL 300 MG/ML SOLN, additional oral
enteric contrast

[Series 2: cap with · axial · 0.73mm/px · z∈[-462,+78]mm · 11 of 130 slices shown, 13 images]
[im 11/130  soft-tissue]
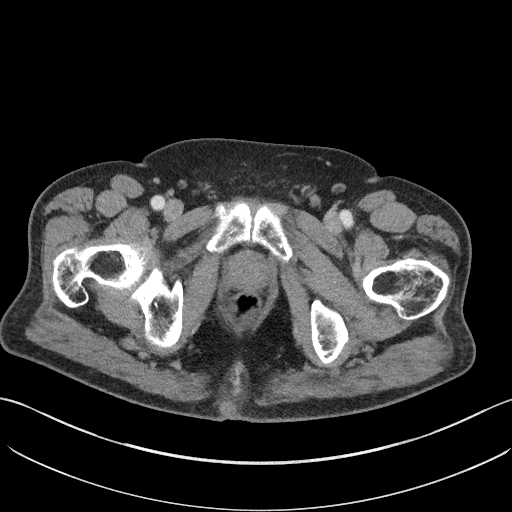
[im 11/130  bone]
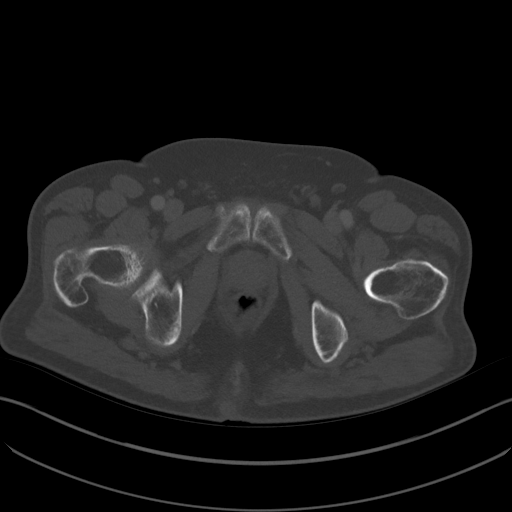
[im 22/130  soft-tissue]
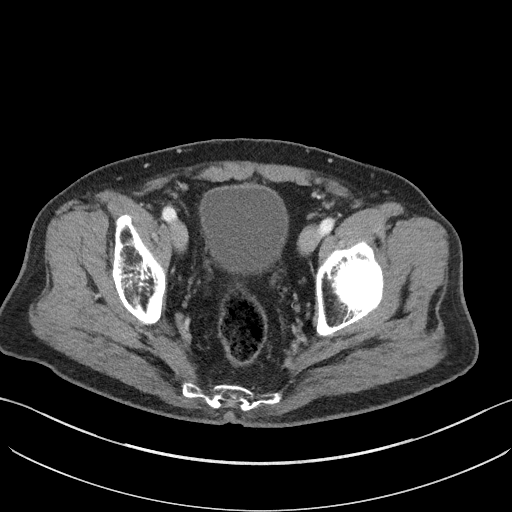
[im 33/130  soft-tissue]
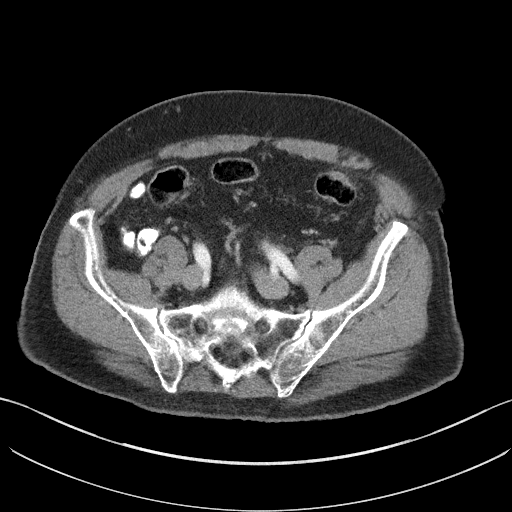
[im 44/130  soft-tissue]
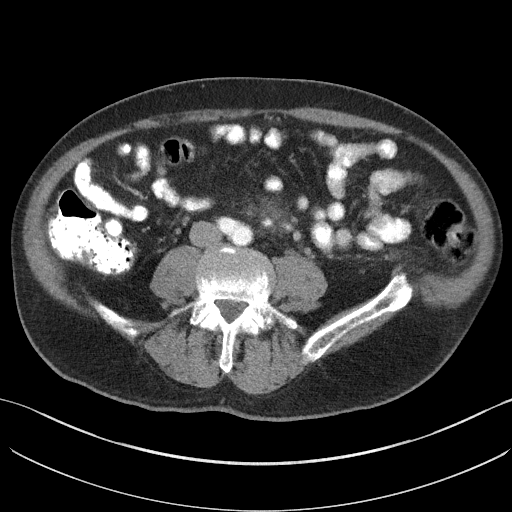
[im 54/130  soft-tissue]
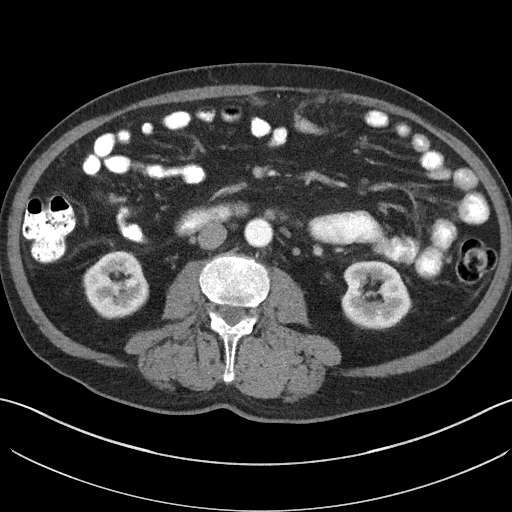
[im 65/130  soft-tissue]
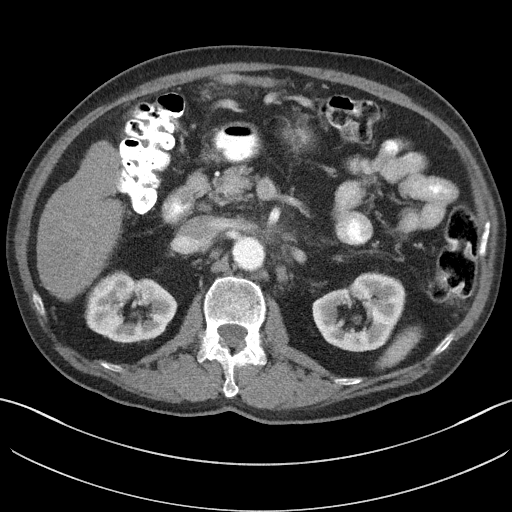
[im 76/130  soft-tissue]
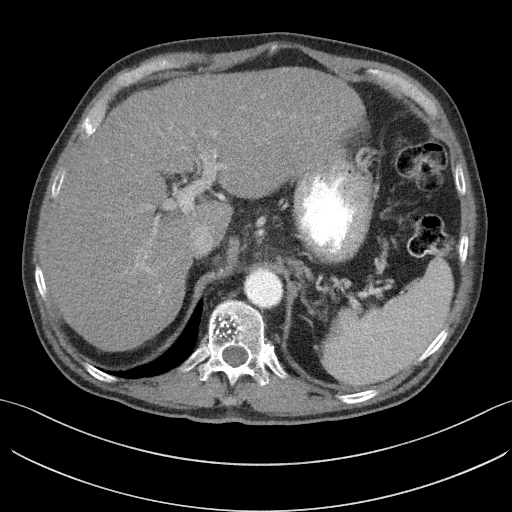
[im 87/130  soft-tissue]
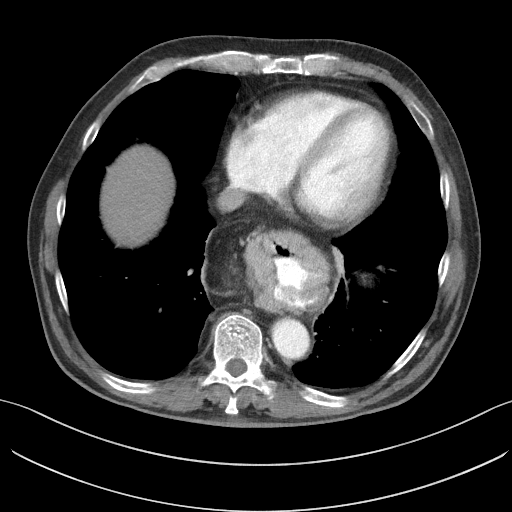
[im 97/130  soft-tissue]
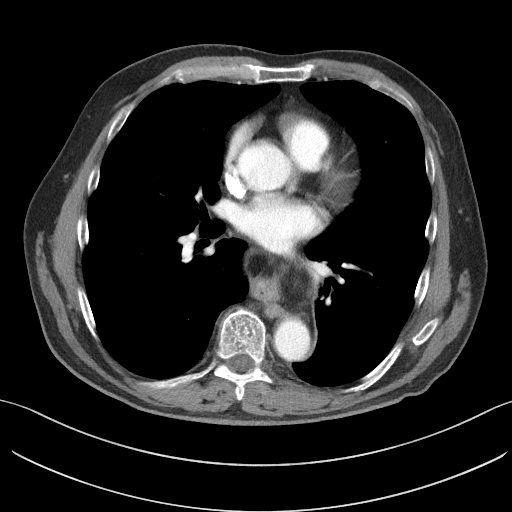
[im 97/130  bone]
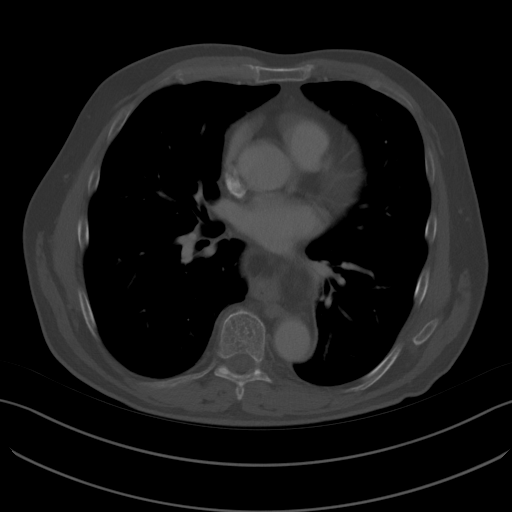
[im 108/130  soft-tissue]
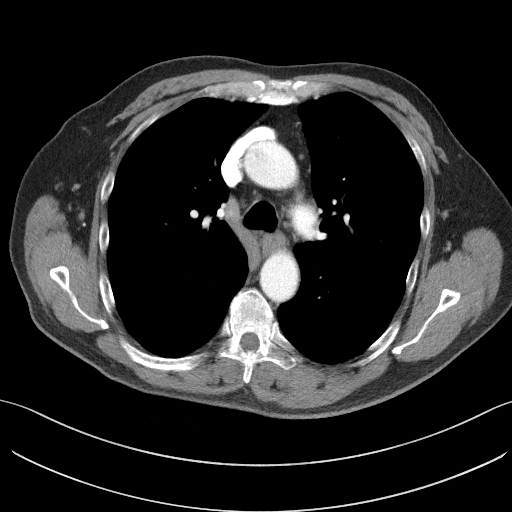
[im 119/130  soft-tissue]
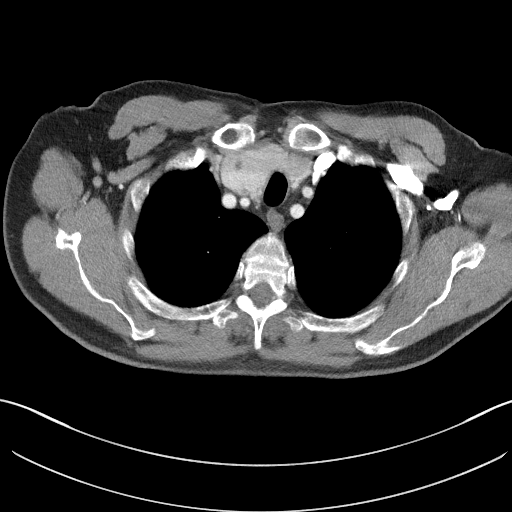

[Series 5: coronal · coronal · 0.80mm/px · 3 of 151 slices shown]
[im 51/151  soft-tissue]
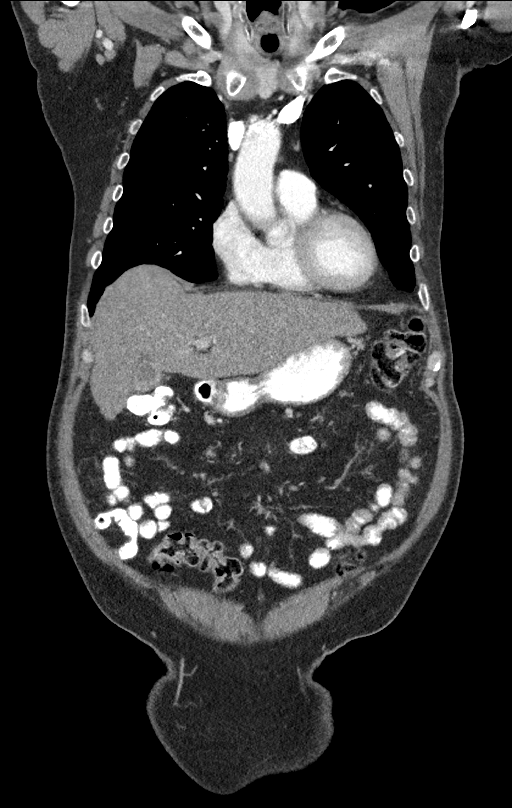
[im 67/151  soft-tissue]
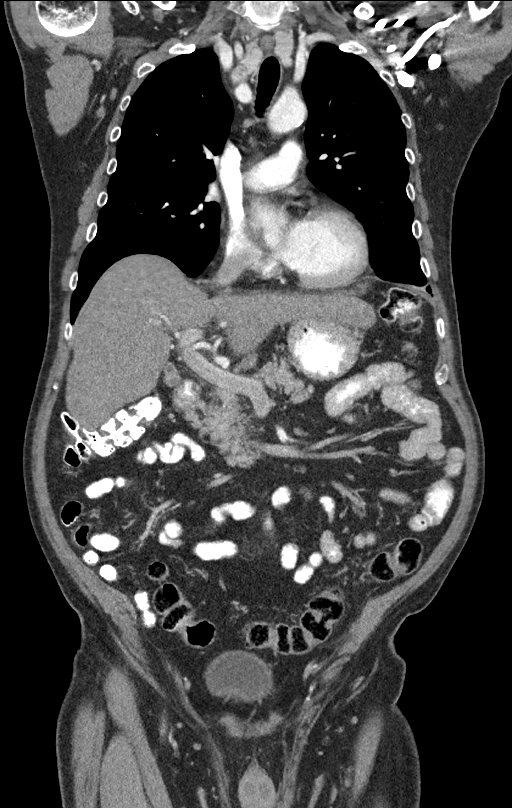
[im 84/151  soft-tissue]
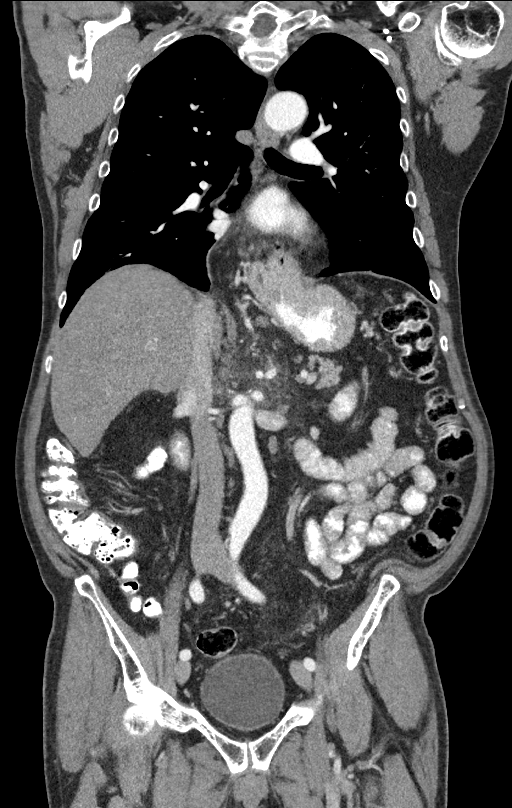

[14 of 46 positions shown; findings below may reference images not displayed]

FINDINGS: CT CHEST FINDINGS

Cardiovascular: Scattered aortic atherosclerosis. Normal heart size.
No pericardial effusion.

Mediastinum/Nodes: No enlarged mediastinal, hilar, or axillary lymph
nodes. Moderate hiatal hernia with intrathoracic position of the
gastric fundus. Enlarged, heterogeneous thyroid, previously assessed
by thyroid ultrasound. Trachea, and esophagus demonstrate no
significant findings.

Lungs/Pleura: Lungs are clear. No pleural effusion or pneumothorax.

Musculoskeletal: No chest wall mass or suspicious bone lesions
identified.

CT ABDOMEN PELVIS FINDINGS

Hepatobiliary: Somewhat coarse, nodular contour of the liver. No
gallstones, gallbladder wall thickening, or biliary dilatation.

Pancreas: Unremarkable. No pancreatic ductal dilatation or
surrounding inflammatory changes.

Spleen: Normal in size without significant abnormality.

Adrenals/Urinary Tract: Adrenal glands are unremarkable. Kidneys are
normal, without renal calculi, solid lesion, or hydronephrosis.
Bladder is unremarkable.

Stomach/Bowel: Stomach is within normal limits. Appendix appears
normal. No evidence of bowel wall thickening, distention, or
inflammatory changes.

Vascular/Lymphatic: No significant vascular findings are present.
Fat stranding in the retroperitoneum, particularly about the celiac
origin (series 2, image 63) and in the mesentery about the inferior
mesenteric artery (series 2, image 88). Prominent subcentimeter
retroperitoneal lymph nodes.

Reproductive: No mass or other abnormality.

Other: No abdominal wall hernia or abnormality. No abdominopelvic
ascites.

Musculoskeletal: No acute or significant osseous findings.
IMPRESSION: 1. Somewhat coarse, nodular contour of the liver, suggestive of
cirrhosis. There are no definitive stigmata of cirrhosis or portal
hypertension. No obvious varices. No splenomegaly.
2. Moderate hiatal hernia with intrathoracic position of the gastric
fundus.
3. Fat stranding in the retroperitoneum, particularly about the
celiac origin (series 2, image 63) and in the mesentery about the
inferior mesenteric artery. Prominent subcentimeter retroperitoneal
lymph nodes. Findings are nonspecific, possibly infectious or
inflammatory although malignancy such as lymphoma is not strictly
excluded. Consider follow-up CT in 3-6 months to assess for
stability or resolution.
4. Aortic Atherosclerosis ([BW]-[BW]).

## 2020-05-20 MED ORDER — IOHEXOL 300 MG/ML  SOLN
100.0000 mL | Freq: Once | INTRAMUSCULAR | Status: AC | PRN
  Administered 2020-05-20: 100 mL via INTRAVENOUS

## 2020-05-23 ENCOUNTER — Telehealth: Payer: Self-pay | Admitting: Gastroenterology

## 2020-05-23 ENCOUNTER — Other Ambulatory Visit: Payer: Self-pay | Admitting: Gastroenterology

## 2020-05-23 DIAGNOSIS — R932 Abnormal findings on diagnostic imaging of liver and biliary tract: Secondary | ICD-10-CM

## 2020-05-23 NOTE — Telephone Encounter (Signed)
Patent requesting CT results

## 2020-05-23 NOTE — Telephone Encounter (Signed)
I called the patient - details of our conversation in the CT scan result note

## 2020-05-24 ENCOUNTER — Other Ambulatory Visit (INDEPENDENT_AMBULATORY_CARE_PROVIDER_SITE_OTHER): Payer: Medicare PPO

## 2020-05-24 DIAGNOSIS — R932 Abnormal findings on diagnostic imaging of liver and biliary tract: Secondary | ICD-10-CM | POA: Diagnosis not present

## 2020-05-24 LAB — COMPREHENSIVE METABOLIC PANEL
ALT: 28 U/L (ref 0–53)
AST: 44 U/L — ABNORMAL HIGH (ref 0–37)
Albumin: 3.6 g/dL (ref 3.5–5.2)
Alkaline Phosphatase: 120 U/L — ABNORMAL HIGH (ref 39–117)
BUN: 8 mg/dL (ref 6–23)
CO2: 23 mEq/L (ref 19–32)
Calcium: 9.3 mg/dL (ref 8.4–10.5)
Chloride: 105 mEq/L (ref 96–112)
Creatinine, Ser: 0.68 mg/dL (ref 0.40–1.50)
GFR: 117.01 mL/min (ref >60.00)
Glucose, Bld: 89 mg/dL (ref 70–99)
Potassium: 3.8 mEq/L (ref 3.5–5.1)
Sodium: 136 mEq/L (ref 135–145)
Total Bilirubin: 1.7 mg/dL — ABNORMAL HIGH (ref 0.2–1.2)
Total Protein: 7 g/dL (ref 6.0–8.3)

## 2020-05-24 LAB — PROTIME-INR
INR: 1.4 ratio — ABNORMAL HIGH (ref 0.8–1.0)
Prothrombin Time: 15.5 s — ABNORMAL HIGH (ref 9.6–13.1)

## 2020-05-26 ENCOUNTER — Telehealth: Payer: Self-pay | Admitting: Gastroenterology

## 2020-05-26 DIAGNOSIS — Z23 Encounter for immunization: Secondary | ICD-10-CM

## 2020-05-26 NOTE — Telephone Encounter (Signed)
Dr. Sydnee Levans, patient calling for lab results.  Please review and advise

## 2020-05-26 NOTE — Telephone Encounter (Signed)
Jeffrey Hanson he is not immune to hepatitis A or B, can you please coordinate vaccine this for him.  Thanks     I called the patient, reviewed lab findings.  Still awaiting complete labs and not all back yet to make final recommendations.  I am concerned his INR is a bit elevated as are his LFTs and I think it is quite possible he has cirrhosis.  We discussed this for a bit.  I need to finalize recommendations with Dr. Rush Landmark whether EUS is reasonable to look in the varices and consideration for liver biopsy via EUS, and remove gastric polyp.  If the patient has cirrhosis, he is otherwise compensated and reassured him of that.  I will get back to him in the next week or so with further recommendations.

## 2020-05-26 NOTE — Telephone Encounter (Signed)
Of note, if he does have cirrhosis he should stop crestor. I asked him to stop this while workup ongoing

## 2020-05-27 ENCOUNTER — Telehealth: Payer: Self-pay | Admitting: Gastroenterology

## 2020-05-27 LAB — ANTI-NUCLEAR AB-TITER (ANA TITER): ANA Titer 1: 1:320 {titer} — ABNORMAL HIGH

## 2020-05-27 LAB — IGG: IgG (Immunoglobin G), Serum: 1552 mg/dL — ABNORMAL HIGH (ref 600–1540)

## 2020-05-27 LAB — HEPATITIS C ANTIBODY
Hepatitis C Ab: NONREACTIVE
SIGNAL TO CUT-OFF: 0.03 (ref <1.00)

## 2020-05-27 LAB — ALPHA-1-ANTITRYPSIN: A-1 Antitrypsin, Ser: 145 mg/dL (ref 83–199)

## 2020-05-27 LAB — HEPATITIS B SURFACE ANTIGEN: Hepatitis B Surface Ag: NONREACTIVE

## 2020-05-27 LAB — ANTI-SMOOTH MUSCLE ANTIBODY, IGG: Actin (Smooth Muscle) Antibody (IGG): 24 U — ABNORMAL HIGH (ref <20)

## 2020-05-27 LAB — HEPATITIS B SURFACE ANTIBODY,QUALITATIVE: Hep B S Ab: NONREACTIVE

## 2020-05-27 LAB — ANA: Anti Nuclear Antibody (ANA): POSITIVE — AB

## 2020-05-27 LAB — HEPATITIS A ANTIBODY, TOTAL: Hepatitis A AB,Total: NONREACTIVE

## 2020-05-27 NOTE — Telephone Encounter (Signed)
Patent has been scheduled for next week for Twin[employer]

## 2020-05-27 NOTE — Telephone Encounter (Signed)
See lab results for additional details.  

## 2020-05-27 NOTE — Addendum Note (Signed)
Addended by: Marlon Pel on: 05/27/2020 11:40 AM   Modules accepted: Orders

## 2020-05-27 NOTE — Telephone Encounter (Signed)
Left message for patient to call back Twinrix order placed

## 2020-05-31 ENCOUNTER — Ambulatory Visit (INDEPENDENT_AMBULATORY_CARE_PROVIDER_SITE_OTHER): Payer: Medicare PPO | Admitting: Gastroenterology

## 2020-05-31 DIAGNOSIS — Z23 Encounter for immunization: Secondary | ICD-10-CM | POA: Diagnosis not present

## 2020-06-01 ENCOUNTER — Other Ambulatory Visit: Payer: Self-pay

## 2020-06-01 DIAGNOSIS — R7989 Other specified abnormal findings of blood chemistry: Secondary | ICD-10-CM

## 2020-06-01 DIAGNOSIS — R768 Other specified abnormal immunological findings in serum: Secondary | ICD-10-CM

## 2020-06-01 DIAGNOSIS — R932 Abnormal findings on diagnostic imaging of liver and biliary tract: Secondary | ICD-10-CM

## 2020-06-02 ENCOUNTER — Telehealth: Payer: Self-pay

## 2020-06-02 NOTE — Telephone Encounter (Signed)
----- Message from Irving Copas., MD sent at 06/01/2020  8:07 PM EDT ----- Regarding: RE: gastric polyp Steve,As we discussed recently you are my thoughts:1) these move forward as you see fit in regards to obtaining a liver biopsy plus/minus transjugular pressures to better evaluate portosystemic hypertension risk2) if the portosystemic gradients are within limits that would suggest cirrhosis and I am not sure an EUS to evaluate for varices is indicated3) if the portosystemic gradients are low but we have findings still concerning then certainly an EUS to evaluate for paraesophageal varices will be helpful4) we can certainly try to get that polyp removed due to its hyperplastic nature and role of resection of these when they are greater than a centimeter in size as well as the bruising although he remains nonanemic5) we will move forward with a clinic visit to discuss potential EMR in the coming weeks6) we will schedule him EGD with EMR in the next 6 to 10 weeks (Deadra Diggins move forward with getting this scheduled as well as the clinic visit noted above)Thanks.GM ----- Message ----- From: Yetta Flock, MD Sent: 05/26/2020   5:22 PM EDT To: Irving Copas., MD Subject: RE: gastric polyp                              Thanks Gabe. Labs starting to come back. INR slightly elevated, I think he may have cirrhosis with small varices but not 100% sure. Wondering if EUS could be considered to clarify if he has varices, and perform liver biopsy if needed. He's really anxious about this and I think may want a liver biopsy regardless (he also drinks some alcohol and I think minimizes intake - would be good to know if this has had anything to do with this). Not sure if EUS for these issues and removal of the polyp is too much or how you feel about this, or just do biopsy via IR and hold off on the rest right now. He wants the polyp gone but as you mention it's not urgent. Thanks for your input  -----  Message ----- From: Irving Copas., MD Sent: 05/24/2020   1:51 PM EDT To: Yetta Flock, MD Subject: RE: gastric polyp                              Certainly may want to just try to have patient on a low dose PPI and if he has no symptoms from it, then we can monitor it and monitor his counts.  If after you have seen him and spoken further, I could certainly still entertain the chance of removing, but not sure it needs to if asymptomatic and not causing anemia currently. Understanding potential underlying cirrhosis seems more pressing. After you have spoken with him and get any further signs/symptoms that may suggest need for removal we can certainly do this. Thanks. GM ----- Message ----- From: Yetta Flock, MD Sent: 05/23/2020   7:33 AM EDT To: Irving Copas., MD Subject: RE: gastric polyp                              Sloan Leiter, Just wanted to follow up on this. CBC and iron studies are normal. CT scan suggestive of cirrhosis although platelets and spleen are normal. I do think he probably has cirrhosis  given he has small varices and CT findings, but sounds like otherwise mostly compensated. I'm going to speak with him today and will workup for underlying liver disease and get his baseline INR. Curious to see what you think about the polyp at this point. Thanks  Richardson Landry ----- Message ----- From: Irving Copas., MD Sent: 05/16/2020   5:09 PM EDT To: Yetta Flock, MD Subject: RE: gastric polyp                              SA,Thanks for reaching out.Seems reasonable but probably need to see what the CT scan shows first.Also would plan to get some labs and iron studies to see if he has developed anemia and iron deficiency which gives [place] even more reason to go after this.Let me know what the CT scan shows and how the labs look and then we can move forward with talking with him and try to get this off, especially if he has anemia with iron  deficiency.Thanks.GM ----- Message ----- From: Yetta Flock, MD Sent: 05/16/2020   5:04 PM EDT To: Irving Copas., MD Subject: gastric polyp                                  Sloan Leiter, [facility] all is well. Wanted to see if you are interested in removing this large gastric polyp - I think it is inflammatory but pretty ulcerated and large, semi pendunculated.   Incidentally noted I think he has some esophageal varices, I'm awaiting a CT scan to evaluate for portal hypertension. No known history of cirrhosis.  Thanks for your opinion.  Richardson Landry

## 2020-06-02 NOTE — Telephone Encounter (Signed)
My Chart video visit scheduled for tomorrow with Dr Rush Landmark.  Pt aware

## 2020-06-03 ENCOUNTER — Telehealth (INDEPENDENT_AMBULATORY_CARE_PROVIDER_SITE_OTHER): Payer: Medicare PPO | Admitting: Gastroenterology

## 2020-06-03 DIAGNOSIS — R932 Abnormal findings on diagnostic imaging of liver and biliary tract: Secondary | ICD-10-CM

## 2020-06-03 DIAGNOSIS — R198 Other specified symptoms and signs involving the digestive system and abdomen: Secondary | ICD-10-CM

## 2020-06-03 DIAGNOSIS — K317 Polyp of stomach and duodenum: Secondary | ICD-10-CM

## 2020-06-03 NOTE — Progress Notes (Signed)
[facility] VISIT   Primary Care Provider Gaynelle Arabian, MD [address] [place] [address] [phone]  Referring Provider Dr. Havery Moros  Patient Profile: Jeffrey Hanson is a 64 y.o. male with a pmh significant for hyperlipidemia, prior pericarditis, GERD, Symptomatic Cholelithiasis, ?Cirrhosis, Inflammatory Gastric Polyps.  The patient presents to the [facility] for an evaluation and management of problem(s) noted below:  Problem List 1. Gastric polyps   2. Abnormal findings on esophagogastroduodenoscopy (EGD)   3. Abnormal liver diagnostic imaging     History of Present Illness Please see prior progress notes and telephone notes from Dr. Havery Moros for full details of HPI.  Interval History I was asked to evaluate the patient for finding on recent endoscopy of a large ulcerated appearing polyp in the distal hiatal hernia/proximal stomach.  During endoscopy, varices were also noted and the patient is now being worked up by his primary gastroenterologist for possible underlying cirrhosis.  CT imaging concerning for potential nodularity although spleen was normal size and the patient has no thrombocytopenia.  With this being said there was concern about the potential need for EUS and liver biopsy.  Patient had laboratories performed and he has no evidence of anemia or iron deficiency.  Patient was seen by surgery and there was concern for potential symptomatic cholelithiasis.  This is what led him to have an upper endoscopy performed with the findings as noted above.  Patient has multiple questions about the potential of having underlying cirrhosis and what this means for him in the short-term and long-term.  He has been very upset by the possibility of having cirrhosis by what he reads online and it seems that his life expectancy is significantly decreased at this time.  The patient denies any symptoms of coffee-ground  emesis or hematemesis.  He denies any overt melena.  He has abdominal pain postprandially the majority of time and it can occur between 15 minutes and 45 minutes after eating.  GI Review of Systems Positive as above Negative for odynophagia, dysphagia, melena, hematochezia  Review of Systems General: Denies fevers/chills/weight loss unintentionally HEENT: Denies oral lesions Cardiovascular: Denies chest pain/palpitations Pulmonary: Denies shortness of breath Gastroenterological: See HPI Genitourinary: Denies darkened urine Hematological: Denies easy bruising/bleeding Dermatological: Denies jaundice Psychological: Mood is anxious   Medications Current Outpatient Medications  Medication Sig Dispense Refill  . ALPRAZolam (XANAX) 0.5 MG tablet Take 0.5 mg by mouth 3 (three) times daily as needed for anxiety.     . cetirizine (ZYRTEC) 10 MG tablet Take 10 mg by mouth daily as needed for allergies.    Marland Kitchen esomeprazole (NEXIUM) 20 MG capsule Take 20 mg by mouth daily at 12 noon.    . rosuvastatin (CRESTOR) 10 MG tablet Take 10 mg by mouth 3 (three) times a week.     Current Facility-Administered Medications  Medication Dose Route Frequency Provider Last Rate Last Admin  . 0.9 %  sodium chloride infusion  500 mL Intravenous Continuous Armbruster, Carlota Raspberry, MD        Allergies Allergies  Allergen Reactions  . Sulfa Antibiotics Rash    Histories Past Medical History:  Diagnosis Date  . GERD (gastroesophageal reflux disease)   . Hyperlipemia    Past Surgical History:  Procedure Laterality Date  . COLONOSCOPY  12/21/2015  . MOUTH SURGERY    . POLYPECTOMY    . SHOULDER ARTHROSCOPY     left, bone spur  . TONSILLECTOMY AND ADENOIDECTOMY    .  VASECTOMY     Social History   Socioeconomic History  . Marital status: Married    Spouse name: Not on file  . Number of children: Not on file  . Years of education: Not on file  . Highest education level: Not on file  Occupational  History  . Not on file  Tobacco Use  . Smoking status: Former Smoker    Types: Cigars  . Smokeless tobacco: Never Used  Substance and Sexual Activity  . Alcohol use: Yes    Alcohol/week: 15.0 standard drinks    Types: 8 Cans of beer, 7 Shots of liquor per week  . Drug use: No  . Sexual activity: Not on file  Other Topics Concern  . Not on file  Social History Narrative  . Not on file   Social Determinants of Health   Financial Resource Strain:   . Difficulty of Paying Living Expenses:   Food Insecurity:   . Worried About Charity fundraiser in the Last Year:   . Arboriculturist in the Last Year:   Transportation Needs:   . Film/video editor (Medical):   Marland Kitchen Lack of Transportation (Non-Medical):   Physical Activity:   . Days of Exercise per Week:   . Minutes of Exercise per Session:   Stress:   . Feeling of Stress :   Social Connections:   . Frequency of Communication with Friends and Family:   . Frequency of Social Gatherings with Friends and Family:   . Attends Religious Services:   . Active Member of Clubs or Organizations:   . Attends Archivist Meetings:   Marland Kitchen Marital Status:   Intimate Partner Violence:   . Fear of Current or Ex-Partner:   . Emotionally Abused:   Marland Kitchen Physically Abused:   . Sexually Abused:    Family History  Problem Relation Age of Onset  . Colon cancer Paternal Grandfather        mid 87's  . Heart disease Father   . Colon polyps Neg Hx   . Rectal cancer Neg Hx   . Stomach cancer Neg Hx   . Esophageal cancer Neg Hx   . Inflammatory bowel disease Neg Hx   . Liver disease Neg Hx   . Pancreatic cancer Neg Hx    I have reviewed his medical, social, and family history in detail and updated the electronic medical record as necessary.    PHYSICAL EXAMINATION  Telemedicine Visit   REVIEW OF DATA  I reviewed the following data at the time of this encounter:  GI Procedures and Studies  May 2021 EGD - Esophagogastric landmarks  identified. - 6 cm hiatal hernia. - Possible 2 columns of esophageal varices in the distal esophagus. No known history of cirrhosis or portal hypertension - esophagus was difficult to insufflate due to large hernia, could be prominent folds however concerned this more likely be varices. - A single large gastric polyp as described at beginning of the hernia sac, suspect inflammatory in nature. Biopsied. Will need removal at the hospital due to risk of bleeding. - Multiple benign appearing gastric polyps. - Gastritis. Biopsied. - Nodular mucosa in the duodenal bulb, suspect benign ectopic gastric mucosa. Biopsied. - Four duodenal polyps. Resected and retrieved.  Pathology Diagnosis 1. Surgical [P], duodenal polyps - POLYPOID DUODENAL MUCOSA WITH HYPERPLASTIC CHANGES - NEGATIVE FOR DYSPLASIA 2. Surgical [P], duodenal bulb - GASTRIC HETEROTOPIA 3. Surgical [P], gastric antrum and body - GASTRIC OXYNTIC MUCOSA WITH PARIETAL  CELL HYPERPLASIA AS CAN BE SEEN IN HYPERGASTRINEMIC STATES SUCH AS PPI THERAPY. - GASTRIC ANTRAL MUCOSA WITH NO SPECIFIC HISTOPATHOLOGIC CHANGES - WARTHIN STARRY STAIN IS NEGATIVE FOR HELICOBACTER PYLORI 4. Surgical [P], gastric polyps - FRAGMENTS OF GASTRIC MUCOSA WITH GRANULATION TISSUE, CONSISTENT WITH ULCERATION - NEGATIVE FOR DYSPLASIA OR MALIGNANCY  Laboratory Studies  Reviewed those in EPIC  Imaging Studies  May 2021 CTAP IMPRESSION: 1. Somewhat coarse, nodular contour of the liver, suggestive of cirrhosis. There are no definitive stigmata of cirrhosis or portal hypertension. No obvious varices. No splenomegaly. 2. Moderate hiatal hernia with intrathoracic position of the gastric fundus. 3. Fat stranding in the retroperitoneum, particularly about the celiac origin (series 2, image 63) and in the mesentery about the inferior mesenteric artery. Prominent subcentimeter retroperitoneal lymph nodes. Findings are nonspecific, possibly infectious or inflammatory  although malignancy such as lymphoma is not strictly excluded. Consider follow-up CT in 3-6 months to assess for stability or resolution. 4. Aortic Atherosclerosis (ICD10-I70.0).   ASSESSMENT  Mr. Yankee is a 64 y.o. male with a pmh significant for hyperlipidemia, prior pericarditis, GERD, Symptomatic Cholelithiasis, ?Cirrhosis, Inflammatory Gastric Polyps.  The patient is seen today for evaluation and management of:  1. Gastric polyps   2. Abnormal findings on esophagogastroduodenoscopy (EGD)   3. Abnormal liver diagnostic imaging    The patient is hemodynamically stable.  From a clinical perspective he is also stable.  The polyp looks to likely be an inflammatory ulcerated polyp as a result of his hiatal hernia.  Thankfully, the patient has no evidence of anemia at this time or iron deficiency that requires urgent removal.  Certainly however any hyperplastic polyp is greater than a centimeter should be considered for potential resection.  The bigger issue is what is being worked up by his primary gastroenterologist, Dr. Havery Moros, for the potential of underlying cirrhosis.  I discussed with the patient that this is not a death sentence.  We also still need additional work-up which is being set up by Dr. Havery Moros (transjugular liver biopsy with portal pressure gradient evaluation).  His meld score is low.  His Marcello Moores score is A.  Based on where he would be, if he does have underlying cirrhosis, is a compensated cirrhotic.  He likely has been many years ahead of him.  From an endoscopic resection perspective I certainly can consider removal of this polyp but it is nonurgent.  Based upon the description and endoscopic pictures I do feel that it is reasonable to pursue an Advanced Polypectomy attempt of the polyp/lesion.  We discussed some of the techniques of advanced polypectomy which include Endoscopic Mucosal Resection, OVESCO Full-Thickness Resection, Endorotor Morcellation, and Tissue  Ablation via Fulguration.  The risks and benefits of endoscopic evaluation were discussed with the patient; these include but are not limited to the risk of perforation, infection, bleeding, missed lesions, lack of diagnosis, severe illness requiring hospitalization, as well as anesthesia and sedation related illnesses.  During attempts at advanced resection, the risks of bleeding and perforation/leak are increased as opposed to diagnostic and screening procedures, and that was discussed with the patient as well.   In addition, I explained that with the possible need for piecemeal resection, subsequent short-interval endoscopic evaluation for follow up and potential retreatment of the lesion/area may be necessary.  I did offer, a referral to surgery in order for patient to have opportunity to discuss surgical management/intervention prior to finalizing decision for attempt at endoscopic removal, however, the patient deferred on this.  If,  after attempt at removal of the polyp/lesion, it is found that the patient has a complication or that an invasive lesion or malignant lesion is found, or that the polyp/lesion continues to recur, the patient is aware and understands that surgery may still be indicated/required.  All patient questions were answered, to the best of my ability, and the patient agrees to the aforementioned plan of action with follow-up as indicated. I could consider an EUS if his work-up is unremarkable to better define the varices that were noted on his endoscopy but we will plan this only as an EGD with EMR.  All patient questions were answered, to the best of my ability, and the patient agrees to the aforementioned plan of action with follow-up as indicated.   PLAN  Follow-up with primary GI for further work-up of potential cirrhosis EGD with EMR in August or September of this year (nonurgent due to a stable blood counts and iron indices) May consider EUS being added on to evaluate distal  esophagus should his transjugular biopsy and portal gradients be unremarkable   No orders of the defined types were placed in this encounter.   New Prescriptions   No medications on file   Modified Medications   No medications on file    Planned Follow Up No follow-ups on file.   Total Time in Face-to-Face and in Coordination of Care for patient including independent/personal interpretation/review of prior testing, medical history, examination, medication adjustment, communicating results with the patient directly, and documentation with the EHR is 30 minutes.   Justice Britain, MD [facility] Advanced Endoscopy Office # [phone]

## 2020-06-03 NOTE — Patient Instructions (Signed)
If you are age 65 or older, your body mass index should be between 23-30. Your There is no height or weight on file to calculate BMI. If this is out of the aforementioned range listed, please consider follow up with your Primary Care Provider.  If you are age 33 or younger, your body mass index should be between 19-25. Your There is no height or weight on file to calculate BMI. If this is out of the aformentioned range listed, please consider follow up with your Primary Care Provider.    It has been recommended to you by your physician that you have a(n) Endo +EMR at hospital completed. Per your request, we did not schedule the procedure(s) today. At your request I will give you a call back on Monday or Tuesday to schedule after you have looked at your schedule. If you have not heard from me by Tuesday please contact our office at [phone].  Thank you for choosing me and [facility].  Dr. Rush Landmark

## 2020-06-05 ENCOUNTER — Encounter: Payer: Self-pay | Admitting: Gastroenterology

## 2020-06-06 DIAGNOSIS — R198 Other specified symptoms and signs involving the digestive system and abdomen: Secondary | ICD-10-CM | POA: Insufficient documentation

## 2020-06-06 DIAGNOSIS — K317 Polyp of stomach and duodenum: Secondary | ICD-10-CM | POA: Insufficient documentation

## 2020-06-06 DIAGNOSIS — R932 Abnormal findings on diagnostic imaging of liver and biliary tract: Secondary | ICD-10-CM | POA: Insufficient documentation

## 2020-06-07 ENCOUNTER — Telehealth: Payer: Self-pay | Admitting: Gastroenterology

## 2020-06-07 ENCOUNTER — Other Ambulatory Visit: Payer: Self-pay

## 2020-06-07 DIAGNOSIS — R932 Abnormal findings on diagnostic imaging of liver and biliary tract: Secondary | ICD-10-CM

## 2020-06-07 DIAGNOSIS — R7989 Other specified abnormal findings of blood chemistry: Secondary | ICD-10-CM

## 2020-06-07 DIAGNOSIS — R198 Other specified symptoms and signs involving the digestive system and abdomen: Secondary | ICD-10-CM

## 2020-06-07 DIAGNOSIS — K317 Polyp of stomach and duodenum: Secondary | ICD-10-CM

## 2020-06-07 NOTE — Telephone Encounter (Signed)
Returned pt call. Pt has been scheduled for 06/20/20 [facility] EGD+EMR. Pt has been informed that I will call him on tomorrow to confirm time.

## 2020-06-07 NOTE — Telephone Encounter (Signed)
Patient notified that he will be contacted directly by radiology in the next two days.  I spoke with Cathy at [facility] IR

## 2020-06-07 NOTE — Telephone Encounter (Signed)
Patient is calling to follow and schedule procedure

## 2020-06-07 NOTE — Telephone Encounter (Signed)
Patient called to follow up on a liver biopsy he said he needs to schedule for.

## 2020-06-07 NOTE — Telephone Encounter (Signed)
Jeffrey Hanson this pt states he was suppose to call you today?

## 2020-06-08 NOTE — Telephone Encounter (Signed)
Spoke with pt this afternoon, confirmed time TOA 6:00am for 7:30am procedure at [facility] on 06/20/20. Instructions have been sent via mychart as well as sent a copy out in the mail. Pt knows to call office with any questions.

## 2020-06-10 ENCOUNTER — Other Ambulatory Visit: Payer: Self-pay | Admitting: Radiology

## 2020-06-13 ENCOUNTER — Encounter (HOSPITAL_COMMUNITY): Payer: Self-pay

## 2020-06-13 ENCOUNTER — Ambulatory Visit (HOSPITAL_COMMUNITY)
Admission: RE | Admit: 2020-06-13 | Discharge: 2020-06-13 | Disposition: A | Discharge status: Home / Self Care | Payer: Medicare PPO | Source: Ambulatory Visit | Attending: Gastroenterology | Admitting: Gastroenterology

## 2020-06-13 ENCOUNTER — Other Ambulatory Visit: Payer: Self-pay

## 2020-06-13 DIAGNOSIS — R748 Abnormal levels of other serum enzymes: Secondary | ICD-10-CM | POA: Diagnosis not present

## 2020-06-13 DIAGNOSIS — Z9852 Vasectomy status: Secondary | ICD-10-CM | POA: Insufficient documentation

## 2020-06-13 DIAGNOSIS — E785 Hyperlipidemia, unspecified: Secondary | ICD-10-CM | POA: Diagnosis not present

## 2020-06-13 DIAGNOSIS — R7989 Other specified abnormal findings of blood chemistry: Secondary | ICD-10-CM | POA: Insufficient documentation

## 2020-06-13 DIAGNOSIS — R768 Other specified abnormal immunological findings in serum: Secondary | ICD-10-CM | POA: Insufficient documentation

## 2020-06-13 DIAGNOSIS — K219 Gastro-esophageal reflux disease without esophagitis: Secondary | ICD-10-CM | POA: Insufficient documentation

## 2020-06-13 DIAGNOSIS — Z87891 Personal history of nicotine dependence: Secondary | ICD-10-CM | POA: Diagnosis not present

## 2020-06-13 DIAGNOSIS — K766 Portal hypertension: Secondary | ICD-10-CM | POA: Diagnosis not present

## 2020-06-13 DIAGNOSIS — Z882 Allergy status to sulfonamides status: Secondary | ICD-10-CM | POA: Diagnosis not present

## 2020-06-13 DIAGNOSIS — Z800 Family history of malignant neoplasm of digestive organs: Secondary | ICD-10-CM | POA: Insufficient documentation

## 2020-06-13 DIAGNOSIS — R932 Abnormal findings on diagnostic imaging of liver and biliary tract: Secondary | ICD-10-CM | POA: Insufficient documentation

## 2020-06-13 DIAGNOSIS — K7460 Unspecified cirrhosis of liver: Secondary | ICD-10-CM | POA: Diagnosis not present

## 2020-06-13 DIAGNOSIS — Z79899 Other long term (current) drug therapy: Secondary | ICD-10-CM | POA: Diagnosis not present

## 2020-06-13 DIAGNOSIS — K7581 Nonalcoholic steatohepatitis (NASH): Secondary | ICD-10-CM | POA: Insufficient documentation

## 2020-06-13 HISTORY — PX: IR TRANSCATHETER BX: IMG713

## 2020-06-13 LAB — CBC
HCT: 49.6 % (ref 39.0–52.0)
Hemoglobin: 16.4 g/dL (ref 13.0–17.0)
MCH: 31.7 pg (ref 26.0–34.0)
MCHC: 33.1 g/dL (ref 30.0–36.0)
MCV: 95.8 fL (ref 80.0–100.0)
Platelets: 248 10*3/uL (ref 150–400)
RBC: 5.18 MIL/uL (ref 4.22–5.81)
RDW: 13.9 % (ref 11.5–15.5)
WBC: 6.4 10*3/uL (ref 4.0–10.5)
nRBC: 0 % (ref 0.0–0.2)

## 2020-06-13 LAB — PROTIME-INR
INR: 1.1 (ref 0.8–1.2)
Prothrombin Time: 13.8 seconds (ref 11.4–15.2)

## 2020-06-13 IMAGING — XA IR TRANSCATHETER BIOPSY
4 of 5 series · 14 of 16 positions shown · IV contrast (IODINE)
Comparison: none

INDICATION: 64-year-old male with abnormal LFTs, elevated IgG, positive BRABO and
positive smooth muscle antibody.

[Series 1: body 4 · 3 of 25 frames shown (1 of 2)]
[frame 4/25]
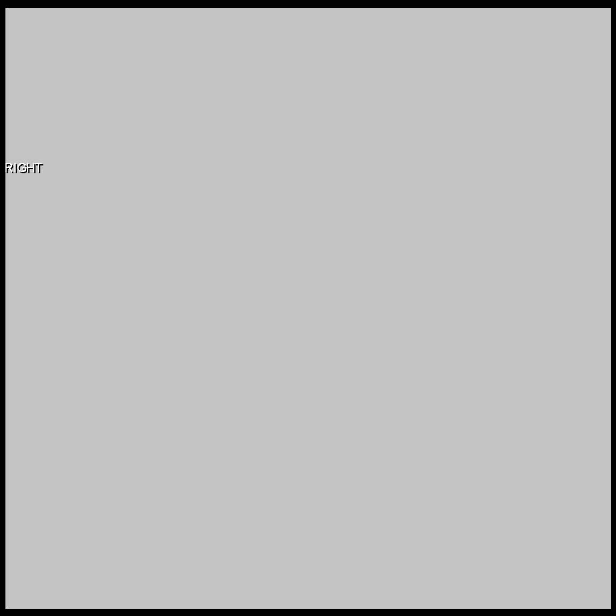
[frame 13/25]
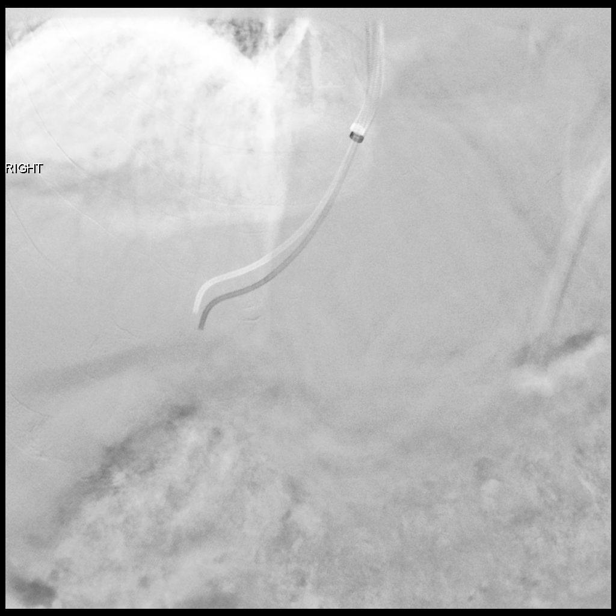
[frame 22/25]
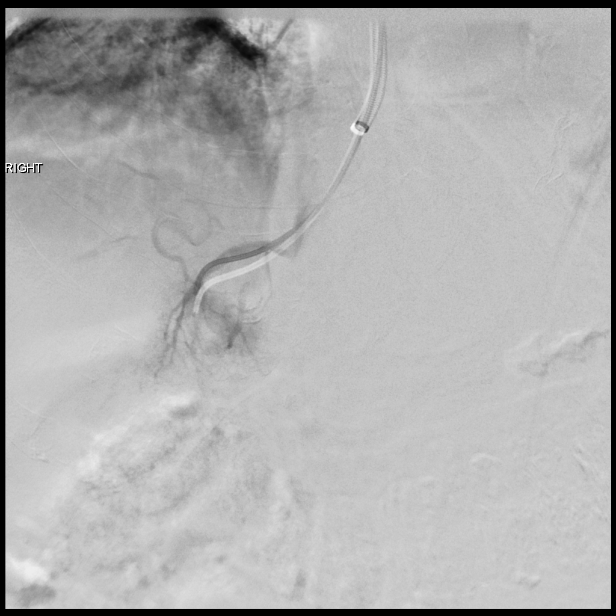

[Series 2: body 4 · 4 of 28 frames shown (2 of 2)]
[frame 5/28]
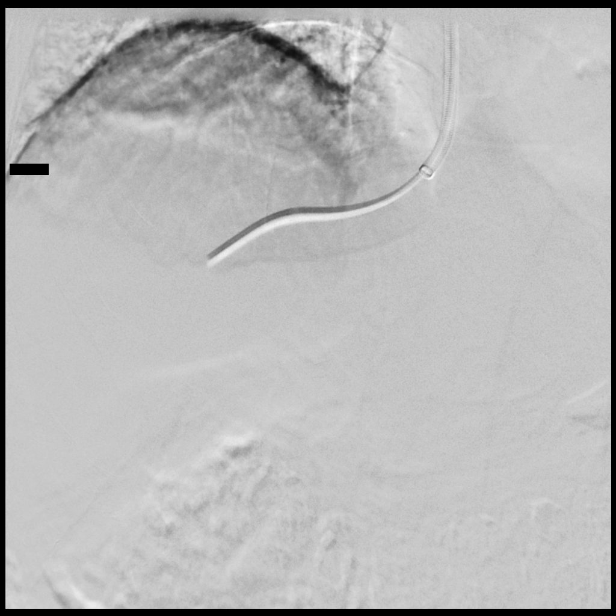
[frame 11/28]
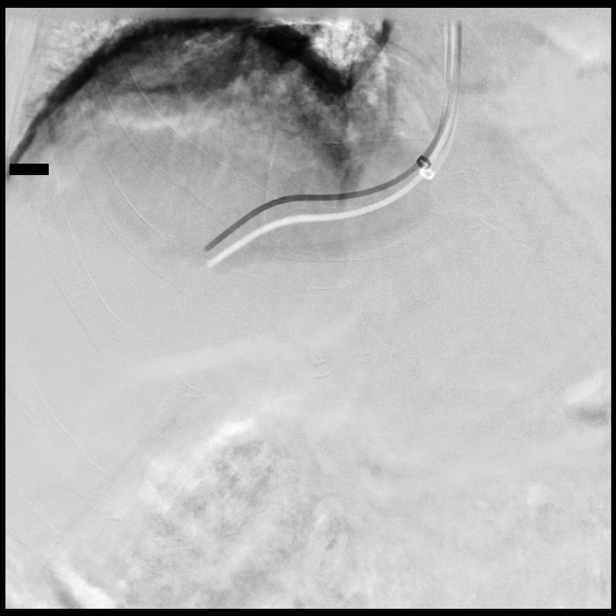
[frame 15/28]
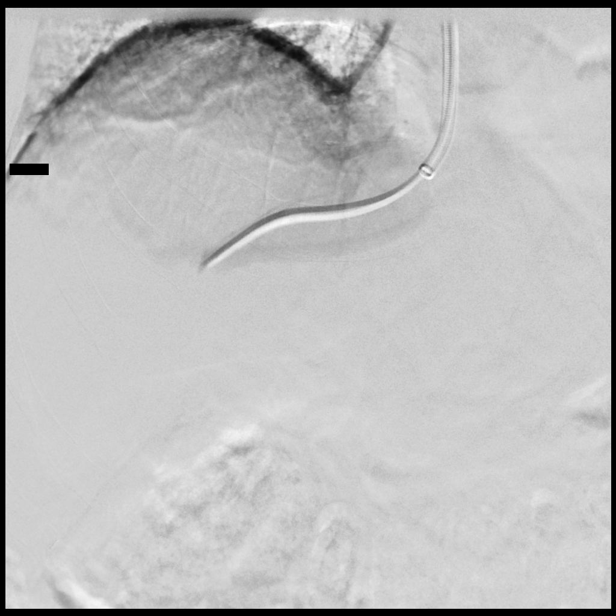
[frame 24/28]
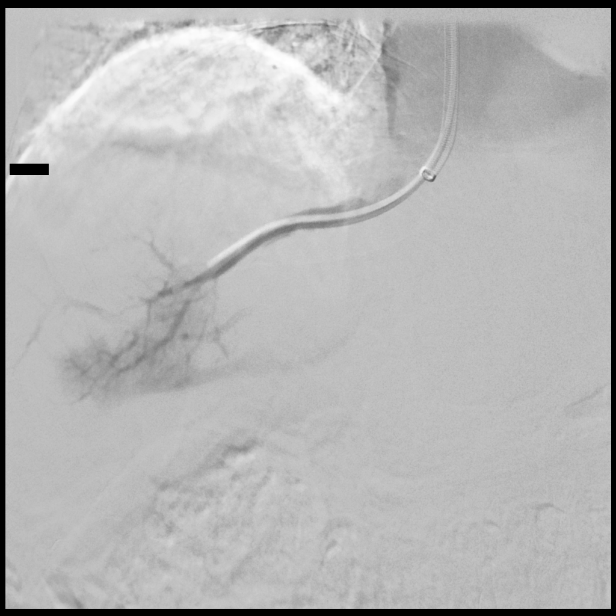

[Series 3: fl - angio · 3 of 41 frames shown]
[frame 7/41]
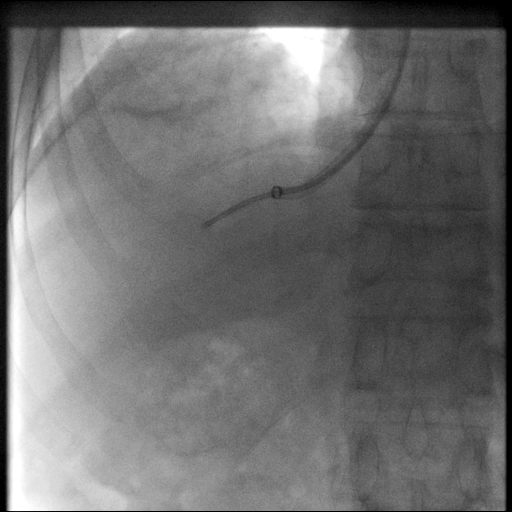
[frame 21/41]
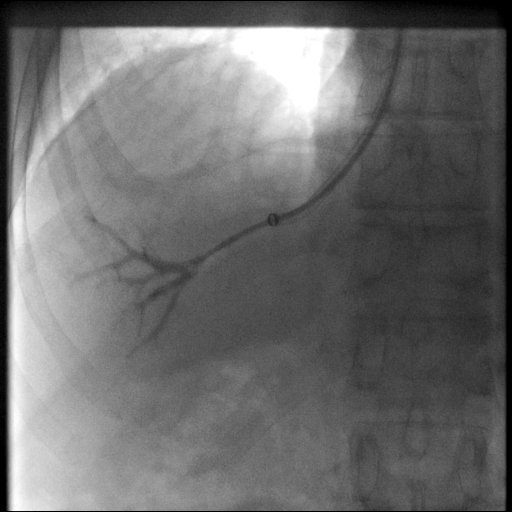
[frame 22/41]
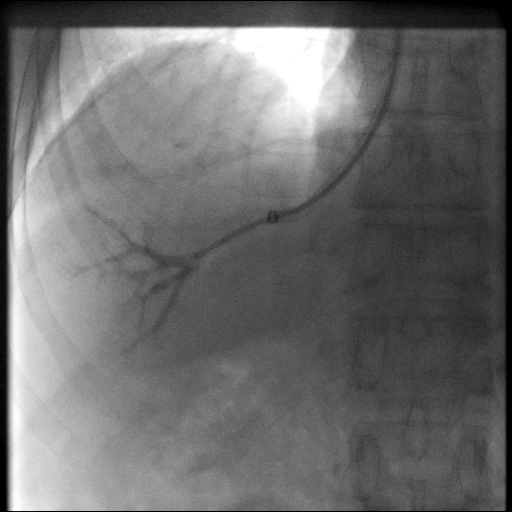

[Series 300: ir transcatheter bx · 4 of 4 slices shown]
[im 1/4]
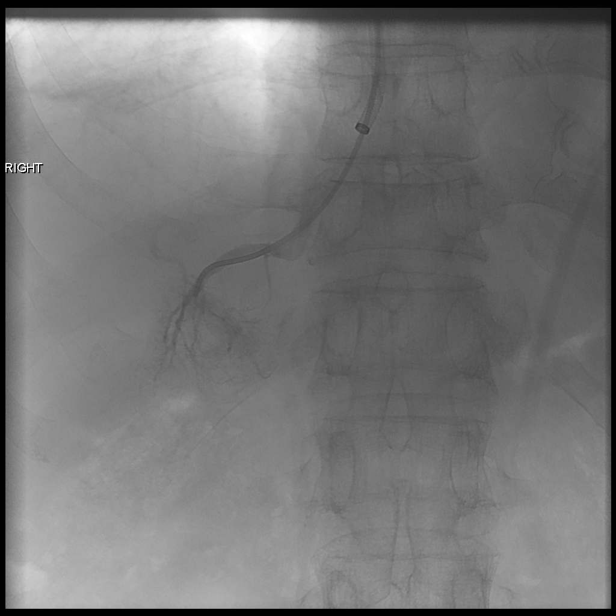
[im 2/4]
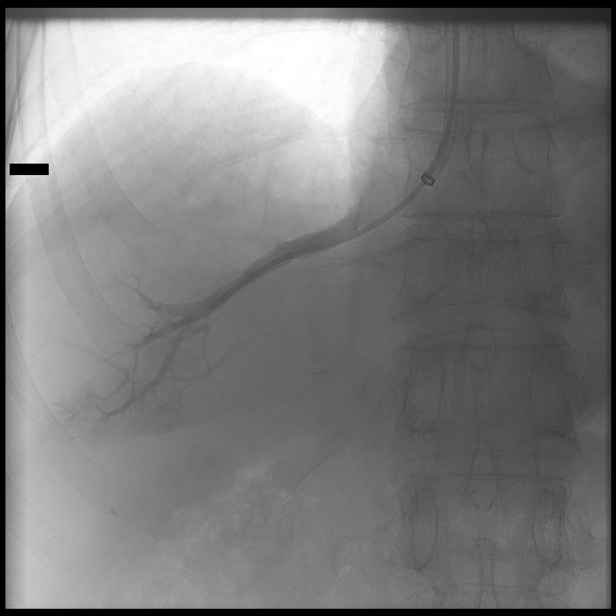
[im 3/4]
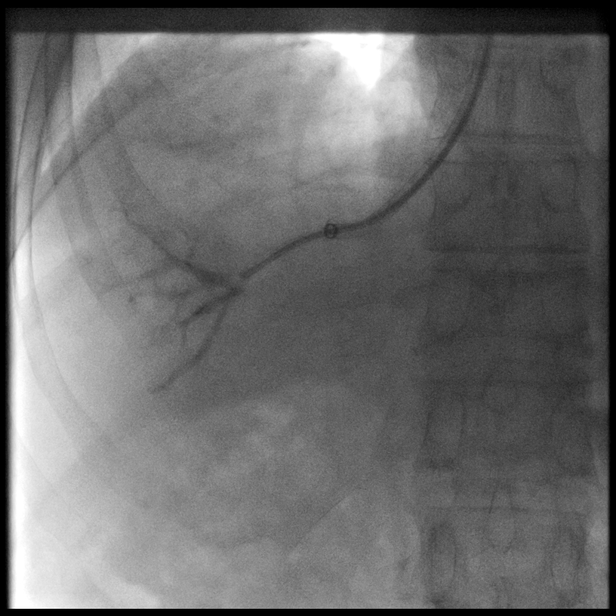
[im 4/4]
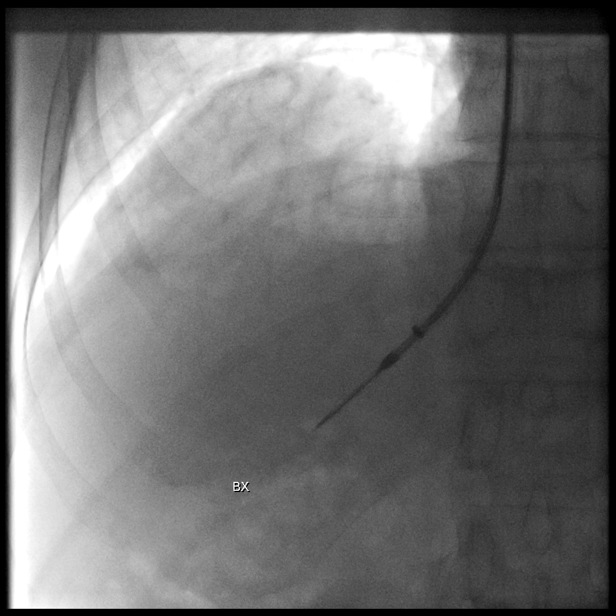

[14 of 16 positions shown; findings below may reference images not displayed]

EXAM:
Transjugular liver biopsy with pressure measurements

MEDICATIONS:
None.

ANESTHESIA/SEDATION:
Moderate (conscious) sedation was employed during this procedure. A
total of Versed for mg and Fentanyl 150 mcg was administered
intravenously.

Moderate Sedation Time: 19 minutes. The patient's level of
consciousness and vital signs were monitored continuously by
radiology nursing throughout the procedure under my direct
supervision.

FLUOROSCOPY TIME:  Fluoroscopy Time: 7 minutes 0 seconds (435 mGy).

COMPLICATIONS:
None immediate.

PROCEDURE:
Informed written consent was obtained from the patient after a
thorough discussion of the procedural risks, benefits and
alternatives. All questions were addressed. Maximal Sterile Barrier
Technique was utilized including caps, mask, sterile gowns, sterile
gloves, sterile drape, hand hygiene and skin antiseptic. A timeout
was performed prior to the initiation of the procedure.

The right internal jugular vein was interrogated with ultrasound and
found to be widely patent. An image was obtained and stored for the
medical record. Local anesthesia was attained by infiltration with
1% lidocaine. A small dermatotomy was made. Under real-time
sonographic guidance, the vessel was punctured with a 21 gauge
micropuncture needle. Using standard technique, the initial micro
needle was exchanged over a 0.018 micro wire for a transitional 4
French micro sheath. The micro sheath was then exchanged over a
0.035 wire for a fascial dilator and the soft tissue tract was
dilated. A 10 French tips sheath was then advanced over the wire and
positioned in the inferior vena cava.

Utilizing an MPA catheter and Bentson wire, the right hepatic vein
was successfully catheterized. A hepatic venogram was performed
confirming the catheter location. The tips sheath was advanced into
the right hepatic vein over a superstiff Amplatz wire. A 5.5 French
Fogarty balloon occlusion catheter was then advanced over the wire
and position in the right hepatic vein. The balloon was blown up.
Contrast injection confirms successful occlusion of the intrahepatic
veins. No evidence of intrahepatic collaterals.

Hepatic venous pressure measurements were then obtained. The free
hepatic venous pressure is 1 mm Hg. The balloon occluded hepatic
venous pressure is 15 mm of mercury. The portal systemic gradient is
therefore 14 mm Hg.

The Fogarty catheter was then removed over the wire. The
transjugular biopsy set was advanced over the wire and positioned in
the right hepatic vein. Multiple 18 gauge core biopsies were then
obtained. The biopsy specimens were placed in formalin and delivered
to pathology for further analysis. The biopsy device and a 10 French
sheath were removed. Hemostasis was attained with gentle manual
pressure.
IMPRESSION: 1. Positive for portal hypertension. The portosystemic gradient is
14 mm of mercury.
2. Technically successful transjugular liver biopsy.

## 2020-06-13 MED ORDER — MIDAZOLAM HCL 2 MG/2ML IJ SOLN
INTRAMUSCULAR | Status: AC | PRN
  Administered 2020-06-13 (×4): 1 mg via INTRAVENOUS

## 2020-06-13 MED ORDER — FENTANYL CITRATE (PF) 100 MCG/2ML IJ SOLN
INTRAMUSCULAR | Status: AC
  Filled 2020-06-13: qty 2

## 2020-06-13 MED ORDER — LIDOCAINE HCL (PF) 1 % IJ SOLN
INTRAMUSCULAR | Status: AC | PRN
  Administered 2020-06-13: 5 mL via INTRADERMAL

## 2020-06-13 MED ORDER — LIDOCAINE HCL 1 % IJ SOLN
INTRAMUSCULAR | Status: AC
  Filled 2020-06-13: qty 20

## 2020-06-13 MED ORDER — FENTANYL CITRATE (PF) 100 MCG/2ML IJ SOLN
INTRAMUSCULAR | Status: AC | PRN
  Administered 2020-06-13 (×3): 50 ug via INTRAVENOUS

## 2020-06-13 MED ORDER — MIDAZOLAM HCL 2 MG/2ML IJ SOLN
INTRAMUSCULAR | Status: AC
  Filled 2020-06-13: qty 2

## 2020-06-13 MED ORDER — SODIUM CHLORIDE 0.9 % IV SOLN: INTRAVENOUS | Status: DC

## 2020-06-13 MED ORDER — IOHEXOL 300 MG/ML  SOLN
50.0000 mL | Freq: Once | INTRAMUSCULAR | Status: AC | PRN
  Administered 2020-06-13: 10 mL via INTRAVENOUS

## 2020-06-13 NOTE — Procedures (Signed)
Interventional Radiology Procedure Note  Procedure: Transjugular liver biopsy and portal venous pressures.   Complications: None  Estimated Blood Loss: None  Recommendations: - Bedrest x 2 hrs - DC Home   Signed,  Criselda Peaches, MD

## 2020-06-13 NOTE — Discharge Instructions (Signed)
No changes to your home medications.  Do not take xanax tonight as you have had sedation today.     Liver Biopsy, Care After These instructions give you information on caring for yourself after your procedure. Your doctor may also give you more specific instructions. Call your doctor if you have any problems or questions after your procedure. What can I expect after the procedure? After the procedure, it is common to have:  Pain and soreness where the biopsy was done.  Bruising around the area where the biopsy was done.  Sleepiness and be tired for a few days. Follow these instructions at home: Medicines  Take over-the-counter and prescription medicines only as told by your doctor.  If you were prescribed an antibiotic medicine, take it as told by your doctor. Do not stop taking the antibiotic even if you start to feel better.  Do not take medicines such as aspirin and ibuprofen. These medicines can thin your blood. Do not take these medicines unless your doctor tells you to take them.  If you are taking prescription pain medicine, take actions to prevent or treat constipation. Your doctor may recommend that you: ? Drink enough fluid to keep your pee (urine) clear or pale yellow. ? Take over-the-counter or prescription medicines. ? Eat foods that are high in fiber, such as fresh fruits and vegetables, whole grains, and beans. ? Limit foods that are high in fat and processed sugars, such as fried and sweet foods. Caring for your cut  Follow instructions from your doctor about how to take care of your cuts from surgery (incisions). Make sure you: ? Wash your hands with soap and water before you change your bandage (dressing). If you cannot use soap and water, use hand sanitizer. ? Change your bandage as told by your doctor. ? You may remove your dressing on 06/14/20. You may shower 06/14/20. No baths, hot tubs, pools, ocean, or lakes until healed.  Check your cuts every day for signs  of infection. Check for: ? Redness, swelling, or more pain. ? Fluid or blood. ? Pus or a bad smell. ? Warmth.  Do not take baths, swim, or use a hot tub until your doctor says it is okay to do so. Activity   Rest at home for 1-2 days or as told by your doctor. ? Avoid sitting for a long time without moving. Get up to take short walks every 1-2 hours.  Return to your normal activities as told by your doctor. Ask what activities are safe for you.  Do not do these things in the first 24 hours: ? Drive. ? Use machinery. ? Take a bath or shower.  Do not lift more than 10 pounds (4.5 kg) or play contact sports for the first 2 weeks. General instructions   Do not drink alcohol in the first week after the procedure.  Have someone stay with you for at least 24 hours after the procedure.  Get your test results. Ask your doctor or the department that is doing the test: ? When will my results be ready? ? How will I get my results? ? What are my treatment options? ? What other tests do I need? ? What are my next steps?  Keep all follow-up visits as told by your doctor. This is important. Contact a doctor if:  A cut bleeds and leaves more than just a small spot of blood.  A cut is red, puffs up (swells), or hurts more than before.  Fluid  or something else comes from a cut.  A cut smells bad.  You have a fever or chills. Get help right away if:  You have swelling, bloating, or pain in your belly (abdomen).  You get dizzy or faint.  You have a rash.  You feel sick to your stomach (nauseous) or throw up (vomit).  You have trouble breathing, feel short of breath, or feel faint.  Your chest hurts.  You have problems talking or seeing.  You have trouble with your balance or moving your arms or legs. Summary  After the procedure, it is common to have pain, soreness, bruising, and tiredness.  Your doctor will tell you how to take care of yourself at home. Change your  bandage, take your medicines, and limit your activities as told by your doctor.  Call your doctor if you have symptoms of infection. Get help right away if your belly swells, your cut bleeds a lot, or you have trouble talking or breathing. This information is not intended to replace advice given to you by your health care provider. Make sure you discuss any questions you have with your health care provider. Document Revised: 12/27/2017 Document Reviewed: 12/27/2017 Elsevier Patient Education  [employer].     Moderate Conscious Sedation, Adult, Care After These instructions provide you with information about caring for yourself after your procedure. Your health care provider may also give you more specific instructions. Your treatment has been planned according to current medical practices, but problems sometimes occur. Call your health care provider if you have any problems or questions after your procedure. What can I expect after the procedure? After your procedure, it is common:  To feel sleepy for several hours.  To feel clumsy and have poor balance for several hours.  To have poor judgment for several hours.  To vomit if you eat too soon. Follow these instructions at home: For at least 24 hours after the procedure:   Do not: ? Participate in activities where you could fall or become injured. ? Drive. ? Use heavy machinery. ? Drink alcohol. ? Take sleeping pills or medicines that cause drowsiness. ? Make important decisions or sign legal documents. ? Take care of children on your own.  Rest. Eating and drinking  Follow the diet recommended by your health care provider.  If you vomit: ? Drink water, juice, or soup when you can drink without vomiting. ? Make sure you have little or no nausea before eating solid foods. General instructions  Have a responsible adult stay with you until you are awake and alert.  Take over-the-counter and prescription medicines  only as told by your health care provider.  If you smoke, do not smoke without supervision.  Keep all follow-up visits as told by your health care provider. This is important. Contact a health care provider if:  You keep feeling nauseous or you keep vomiting.  You feel light-headed.  You develop a rash.  You have a fever. Get help right away if:  You have trouble breathing. This information is not intended to replace advice given to you by your health care provider. Make sure you discuss any questions you have with your health care provider. Document Revised: 11/29/2017 Document Reviewed: 04/07/2016 Elsevier Patient Education  2020 [employer].

## 2020-06-13 NOTE — Consult Note (Signed)
Chief Complaint: Patient was seen in consultation today for image guided transjugular liver biopsy  Referring Physician(s): Racine P  Supervising Physician: Jacqulynn Cadet  Patient Status: [facility] - Out-pt  History of Present Illness: Jeffrey Hanson is a 64 y.o. male smoker with history of GERD, hyperlipidemia, prior alcohol use as well as elevated liver function tests, elevated IgG, positive ANA , positive anti-smooth muscle antibody and imaging findings suggestive of cirrhosis.  He presents today for image guided transjugular liver biopsy to rule out autoimmune hepatitis/cirrhosis as well as to check portal pressure gradient.  Past Medical History:  Diagnosis Date  . GERD (gastroesophageal reflux disease)   . Hyperlipemia     Past Surgical History:  Procedure Laterality Date  . COLONOSCOPY  12/21/2015  . MOUTH SURGERY    . POLYPECTOMY    . SHOULDER ARTHROSCOPY     left, bone spur  . TONSILLECTOMY AND ADENOIDECTOMY    . VASECTOMY      Allergies: Sulfa antibiotics  Medications: Prior to Admission medications   Medication Sig Start Date End Date Taking? Authorizing Provider  ALPRAZolam Duanne Moron) 0.5 MG tablet Take 0.5 mg by mouth at bedtime.  11/26/15   [provider]  esomeprazole (NEXIUM) 20 MG capsule Take 20 mg by mouth daily at 12 noon.    [provider]     Family History  Problem Relation Age of Onset  . Colon cancer Paternal Grandfather        mid 52's  . Heart disease Father   . Colon polyps Neg Hx   . Rectal cancer Neg Hx   . Stomach cancer Neg Hx   . Esophageal cancer Neg Hx   . Inflammatory bowel disease Neg Hx   . Liver disease Neg Hx   . Pancreatic cancer Neg Hx     Social History   Socioeconomic History  . Marital status: Married    Spouse name: Not on file  . Number of children: Not on file  . Years of education: Not on file  . Highest education level: Not on file  Occupational History  . Not on file    Tobacco Use  . Smoking status: Former Smoker    Types: Cigars  . Smokeless tobacco: Never Used  Vaping Use  . Vaping Use: Never used  Substance and Sexual Activity  . Alcohol use: Yes    Alcohol/week: 15.0 standard drinks    Types: 8 Cans of beer, 7 Shots of liquor per week  . Drug use: No  . Sexual activity: Not on file  Other Topics Concern  . Not on file  Social History Narrative  . Not on file   Social Determinants of Health   Financial Resource Strain:   . Difficulty of Paying Living Expenses:   Food Insecurity:   . Worried About Charity fundraiser in the Last Year:   . Arboriculturist in the Last Year:   Transportation Needs:   . Film/video editor (Medical):   Marland Kitchen Lack of Transportation (Non-Medical):   Physical Activity:   . Days of Exercise per Week:   . Minutes of Exercise per Session:   Stress:   . Feeling of Stress :   Social Connections:   . Frequency of Communication with Friends and Family:   . Frequency of Social Gatherings with Friends and Family:   . Attends Religious Services:   . Active Member of Clubs or Organizations:   . Attends Archivist  Meetings:   Marland Kitchen Marital Status:       Review of Systems currently denies fever, headache, chest pain, dyspnea, cough, abdominal/back pain, nausea, vomiting or bleeding  Vital Signs: Blood pressure 138/80, heart rate 92, respirations 17, temp 97.4, O2 sat 100% room air   Physical Exam awake, alert.  Chest clear to auscultation bilaterally.  Heart with regular rate and rhythm.  Abdomen soft, positive bowel sounds, nontender.  No lower extremity edema.  Imaging: CT CHEST W CONTRAST  Result Date: 05/21/2020 CLINICAL DATA:  Epigastric pain, EGD, evaluate for portal hypertension EXAM: CT CHEST, ABDOMEN, AND PELVIS WITH CONTRAST TECHNIQUE: Multidetector CT imaging of the chest, abdomen and pelvis was performed following the standard protocol during bolus administration of intravenous contrast.  CONTRAST:  167mL OMNIPAQUE IOHEXOL 300 MG/ML SOLN, additional oral enteric contrast COMPARISON:  CT chest, 11/21/2017, thyroid ultrasound, 08/12/2019 FINDINGS: CT CHEST FINDINGS Cardiovascular: Scattered aortic atherosclerosis. Normal heart size. No pericardial effusion. Mediastinum/Nodes: No enlarged mediastinal, hilar, or axillary lymph nodes. Moderate hiatal hernia with intrathoracic position of the gastric fundus. Enlarged, heterogeneous thyroid, previously assessed by thyroid ultrasound. Trachea, and esophagus demonstrate no significant findings. Lungs/Pleura: Lungs are clear. No pleural effusion or pneumothorax. Musculoskeletal: No chest wall mass or suspicious bone lesions identified. CT ABDOMEN PELVIS FINDINGS Hepatobiliary: Somewhat coarse, nodular contour of the liver. No gallstones, gallbladder wall thickening, or biliary dilatation. Pancreas: Unremarkable. No pancreatic ductal dilatation or surrounding inflammatory changes. Spleen: Normal in size without significant abnormality. Adrenals/Urinary Tract: Adrenal glands are unremarkable. Kidneys are normal, without renal calculi, solid lesion, or hydronephrosis. Bladder is unremarkable. Stomach/Bowel: Stomach is within normal limits. Appendix appears normal. No evidence of bowel wall thickening, distention, or inflammatory changes. Vascular/Lymphatic: No significant vascular findings are present. Fat stranding in the retroperitoneum, particularly about the celiac origin (series 2, image 63) and in the mesentery about the inferior mesenteric artery (series 2, image 88). Prominent subcentimeter retroperitoneal lymph nodes. Reproductive: No mass or other abnormality. Other: No abdominal wall hernia or abnormality. No abdominopelvic ascites. Musculoskeletal: No acute or significant osseous findings. IMPRESSION: 1. Somewhat coarse, nodular contour of the liver, suggestive of cirrhosis. There are no definitive stigmata of cirrhosis or portal hypertension. No  obvious varices. No splenomegaly. 2. Moderate hiatal hernia with intrathoracic position of the gastric fundus. 3. Fat stranding in the retroperitoneum, particularly about the celiac origin (series 2, image 63) and in the mesentery about the inferior mesenteric artery. Prominent subcentimeter retroperitoneal lymph nodes. Findings are nonspecific, possibly infectious or inflammatory although malignancy such as lymphoma is not strictly excluded. Consider follow-up CT in 3-6 months to assess for stability or resolution. 4. Aortic Atherosclerosis (ICD10-I70.0). Electronically Signed   By: Eddie Candle M.D.   On: 05/21/2020 16:32   CT Abdomen Pelvis W Contrast  Result Date: 05/21/2020 CLINICAL DATA:  Epigastric pain, EGD, evaluate for portal hypertension EXAM: CT CHEST, ABDOMEN, AND PELVIS WITH CONTRAST TECHNIQUE: Multidetector CT imaging of the chest, abdomen and pelvis was performed following the standard protocol during bolus administration of intravenous contrast. CONTRAST:  157mL OMNIPAQUE IOHEXOL 300 MG/ML SOLN, additional oral enteric contrast COMPARISON:  CT chest, 11/21/2017, thyroid ultrasound, 08/12/2019 FINDINGS: CT CHEST FINDINGS Cardiovascular: Scattered aortic atherosclerosis. Normal heart size. No pericardial effusion. Mediastinum/Nodes: No enlarged mediastinal, hilar, or axillary lymph nodes. Moderate hiatal hernia with intrathoracic position of the gastric fundus. Enlarged, heterogeneous thyroid, previously assessed by thyroid ultrasound. Trachea, and esophagus demonstrate no significant findings. Lungs/Pleura: Lungs are clear. No pleural effusion or pneumothorax. Musculoskeletal: No chest wall mass  or suspicious bone lesions identified. CT ABDOMEN PELVIS FINDINGS Hepatobiliary: Somewhat coarse, nodular contour of the liver. No gallstones, gallbladder wall thickening, or biliary dilatation. Pancreas: Unremarkable. No pancreatic ductal dilatation or surrounding inflammatory changes. Spleen: Normal in  size without significant abnormality. Adrenals/Urinary Tract: Adrenal glands are unremarkable. Kidneys are normal, without renal calculi, solid lesion, or hydronephrosis. Bladder is unremarkable. Stomach/Bowel: Stomach is within normal limits. Appendix appears normal. No evidence of bowel wall thickening, distention, or inflammatory changes. Vascular/Lymphatic: No significant vascular findings are present. Fat stranding in the retroperitoneum, particularly about the celiac origin (series 2, image 63) and in the mesentery about the inferior mesenteric artery (series 2, image 88). Prominent subcentimeter retroperitoneal lymph nodes. Reproductive: No mass or other abnormality. Other: No abdominal wall hernia or abnormality. No abdominopelvic ascites. Musculoskeletal: No acute or significant osseous findings. IMPRESSION: 1. Somewhat coarse, nodular contour of the liver, suggestive of cirrhosis. There are no definitive stigmata of cirrhosis or portal hypertension. No obvious varices. No splenomegaly. 2. Moderate hiatal hernia with intrathoracic position of the gastric fundus. 3. Fat stranding in the retroperitoneum, particularly about the celiac origin (series 2, image 63) and in the mesentery about the inferior mesenteric artery. Prominent subcentimeter retroperitoneal lymph nodes. Findings are nonspecific, possibly infectious or inflammatory although malignancy such as lymphoma is not strictly excluded. Consider follow-up CT in 3-6 months to assess for stability or resolution. 4. Aortic Atherosclerosis (ICD10-I70.0). Electronically Signed   By: Eddie Candle M.D.   On: 05/21/2020 16:32    Labs:  CBC: Recent Labs    05/18/20 0925  WBC 5.9  HGB 15.9  HCT 46.6  PLT 207.0    COAGS: Recent Labs    05/24/20 0738  INR 1.4*    BMP: Recent Labs    05/17/20 0741 05/24/20 0738  NA  --  136  K  --  3.8  CL  --  105  CO2  --  23  GLUCOSE  --  89  BUN 11 8  CALCIUM  --  9.3  CREATININE 0.80 0.68     LIVER FUNCTION TESTS: Recent Labs    05/24/20 0738  BILITOT 1.7*  AST 44*  ALT 28  ALKPHOS 120*  PROT 7.0  ALBUMIN 3.6    TUMOR MARKERS: No results for input(s): AFPTM, CEA, CA199, CHROMGRNA in the last 8760 hours.  Assessment and Plan: 64 y.o. male smoker with history of GERD, hyperlipidemia, prior alcohol use as well as elevated liver function tests, elevated IgG, positive ANA , positive anti-smooth muscle antibody and imaging findings suggestive of cirrhosis.  He presents today for image guided transjugular liver biopsy to rule out autoimmune hepatitis/cirrhosis as well as to check portal pressure gradient.  Details/risks of procedure, including but not limited to, internal bleeding, infection, injury to adjacent structures discussed with patient with his understanding and consent.   Thank you for this interesting consult.  I greatly enjoyed meeting Jeffrey Hanson and look forward to participating in their care.  A copy of this report was sent to the requesting provider on this date.  Electronically Signed: D. Rowe Robert, PA-C 06/13/2020, 12:13 PM   I spent a total of 25 minutes  in face to face in clinical consultation, greater than 50% of which was counseling/coordinating care for image guided transjugular liver biopsy

## 2020-06-15 ENCOUNTER — Other Ambulatory Visit: Payer: Self-pay

## 2020-06-15 LAB — SURGICAL PATHOLOGY

## 2020-06-16 ENCOUNTER — Other Ambulatory Visit (HOSPITAL_COMMUNITY): Payer: Medicare PPO

## 2020-06-16 ENCOUNTER — Other Ambulatory Visit: Payer: Self-pay

## 2020-06-16 ENCOUNTER — Telehealth: Payer: Self-pay | Admitting: Gastroenterology

## 2020-06-16 MED ORDER — PROPRANOLOL HCL 20 MG PO TABS: 20.0000 mg | ORAL_TABLET | Freq: Two times a day (BID) | ORAL | 11 refills | Status: DC

## 2020-06-16 NOTE — Telephone Encounter (Signed)
Dr Rush Landmark is going to call pt. I will take care of it after Dr.Mansouraty speak to the pt. Thanks, Patty.

## 2020-06-16 NOTE — Telephone Encounter (Signed)
Jeffrey Hanson, Thank you for following up on this. I asked [name] to postpone his procedure for next Monday. I actually called and spoke with the patient this afternoon to update him and told him that he needed to be postponed for a period in time. As well, Dr. Havery Moros was going to call the patient to update him about the results of his liver biopsy. I would like the patient to be tentatively rescheduled for August although with the findings on his liver biopsy we may postpone things for a while. However, please put him for a follow-up/reschedule for mid August or end of August. This is reasonable because the patient has no anemia and has no iron deficiency. Thanks. GM

## 2020-06-16 NOTE — Telephone Encounter (Signed)
Thanks Gabe. I called the patient today and reviewed liver biopsy results and made some recommendations. Details in liver biopsy path note. Patient is okay to reschedule with you at a later time

## 2020-06-16 NOTE — Telephone Encounter (Signed)
Patient is calling to reschedule EGD

## 2020-06-16 NOTE — Telephone Encounter (Signed)
Ro do you know if this pt needs EGD EMR?  Looks like he was scheduled for 6/21 but it has been cancelled. I can't tell why it was cancelled or who cancelled it.  Please help :)

## 2020-06-20 ENCOUNTER — Encounter (HOSPITAL_COMMUNITY): Admission: RE | Payer: Self-pay | Source: Home / Self Care

## 2020-06-20 ENCOUNTER — Telehealth: Payer: Self-pay | Admitting: Gastroenterology

## 2020-06-20 ENCOUNTER — Ambulatory Visit (HOSPITAL_COMMUNITY): Admission: RE | Admit: 2020-06-20 | Payer: Medicare PPO | Source: Home / Self Care | Admitting: Gastroenterology

## 2020-06-20 SURGERY — ESOPHAGOGASTRODUODENOSCOPY (EGD) WITH PROPOFOL
Anesthesia: Monitor Anesthesia Care

## 2020-06-20 NOTE — Telephone Encounter (Signed)
Returned call to pt. Left message. Pt has been advised to ask to speak with me, when returning call.

## 2020-06-20 NOTE — Telephone Encounter (Signed)
[name] has been r/s to 08/22/20 [facility] @ 10:30am. New instructions will be sent via mychart and sent by mail. Pt has been informed. Pt will call with questions.

## 2020-06-20 NOTE — Telephone Encounter (Signed)
Pt has been r/s for 08/22/20 [facility] EGD+EMR. See previous notes for further details.

## 2020-06-30 ENCOUNTER — Ambulatory Visit (INDEPENDENT_AMBULATORY_CARE_PROVIDER_SITE_OTHER): Payer: Medicare PPO | Admitting: Gastroenterology

## 2020-06-30 DIAGNOSIS — Z23 Encounter for immunization: Secondary | ICD-10-CM | POA: Diagnosis not present

## 2020-08-15 ENCOUNTER — Telehealth: Payer: Self-pay | Admitting: Gastroenterology

## 2020-08-15 NOTE — Telephone Encounter (Signed)
Pt is requesting a call back from Milo, pt states he was told to ask for her.

## 2020-08-16 NOTE — Telephone Encounter (Signed)
Returned pt call. Pt had questions regarding Covid-Test and EGD+EMR on 08/22/20.I re-sent instructions via my-chart. All questions answered. Pt voiced understanding.

## 2020-08-17 ENCOUNTER — Encounter: Payer: Self-pay | Admitting: Gastroenterology

## 2020-08-17 ENCOUNTER — Ambulatory Visit: Payer: Medicare PPO | Admitting: Gastroenterology

## 2020-08-17 ENCOUNTER — Telehealth: Payer: Self-pay

## 2020-08-17 ENCOUNTER — Other Ambulatory Visit (INDEPENDENT_AMBULATORY_CARE_PROVIDER_SITE_OTHER): Payer: Medicare PPO

## 2020-08-17 VITALS — BP 130/80 | HR 56 | Ht 71.0 in | Wt 165.4 lb

## 2020-08-17 DIAGNOSIS — K8020 Calculus of gallbladder without cholecystitis without obstruction: Secondary | ICD-10-CM

## 2020-08-17 DIAGNOSIS — I8500 Esophageal varices without bleeding: Secondary | ICD-10-CM

## 2020-08-17 DIAGNOSIS — K7460 Unspecified cirrhosis of liver: Secondary | ICD-10-CM

## 2020-08-17 DIAGNOSIS — R933 Abnormal findings on diagnostic imaging of other parts of digestive tract: Secondary | ICD-10-CM

## 2020-08-17 DIAGNOSIS — K317 Polyp of stomach and duodenum: Secondary | ICD-10-CM

## 2020-08-17 DIAGNOSIS — K754 Autoimmune hepatitis: Secondary | ICD-10-CM

## 2020-08-17 LAB — HEPATIC FUNCTION PANEL
ALT: 26 U/L (ref 0–53)
AST: 30 U/L (ref 0–37)
Albumin: 3.7 g/dL (ref 3.5–5.2)
Alkaline Phosphatase: 128 U/L — ABNORMAL HIGH (ref 39–117)
Bilirubin, Direct: 0.3 mg/dL (ref 0.0–0.3)
Total Bilirubin: 1.4 mg/dL — ABNORMAL HIGH (ref 0.2–1.2)
Total Protein: 7.2 g/dL (ref 6.0–8.3)

## 2020-08-17 NOTE — Telephone Encounter (Signed)
Pt called to inform that CT appt for 9/9 doe snot work for him because he has an appt with an MD that Dr. Havery Moros referred him to. Pt states that he can do 9/3 the date that his original appt was. Pls call him.

## 2020-08-17 NOTE — Patient Instructions (Addendum)
If you are age 65 or older, your body mass index should be between 23-30. Your Body mass index is 23.07 kg/m. If this is out of the aforementioned range listed, please consider follow up with your Primary Care Provider.  If you are age 21 or younger, your body mass index should be between 19-25. Your Body mass index is 23.07 kg/m. If this is out of the aformentioned range listed, please consider follow up with your Primary Care Provider.   Please go to the lab in the basement of our building to have lab work done as you leave today. Hit "B" for basement when you get on the elevator.  When the doors open the lab is on your left.  We will call you with the results. Thank you.  You will be due for a ultrasound of your liver in November of this year.  We will contact you when it's time to schedule.   We'd like to see you back in 6 months for follow up. We will let you know when it's time to schedule an appointment.   Thank you for entrusting me with your care and for choosing [facility], Dr. Belton Cellar

## 2020-08-17 NOTE — Telephone Encounter (Signed)
Called and rescheduled pt back to Friday, 9-3 at 10:30am to arrive at 10:15am.  Spoke to patient.  He confirmed appt and wrote it down.  I gave him the phone number in case he needs to change it again.

## 2020-08-17 NOTE — Progress Notes (Signed)
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  The patient was previously referred to me in May for an upper endoscopy by his general surgeon, who wanted his stomach cleared of any pathology prior to undergoing cholecystectomy for intermittent abdominal pain.  EGD was performed as below, remarkable findings included incidentally noted esophageal varices, as well as large inflamed gastric polyps.  Biopsies of the polyps were negative for any dysplasia.  He also had a large hiatal hernia, 6 cm in size.  Given the upper endoscopy findings of varices, he had a CT scan done in May which showed changes suggestive of cirrhosis but no definitive stigmata of portal hypertension.  He also had some nonspecific fat stranding in the retroperitoneum about the celiac origin, radiology was recommending a follow-up CT scan in 3 to 6 months.  His CT scan led to serologic evaluation for chronic liver disease.  He had a strongly positive ANA, mildly elevated smooth muscle antibody and total immunoglobulin level.  He was not immune to hepatitis A or B and was vaccinated against those.  He ultimately underwent transjugular liver biopsy to further evaluate these findings, he had evidence of portal hypertension with a gradient of 14.  Biopsy was consistent with cirrhosis and autoimmune hepatitis.  I referred him to see hepatology, Dr. Marcelle Smiling office.  He was placed on prednisone 30 mg a day with the thought of transitioning to him to Imuran over time.  He states he had significant mood changes and insomnia on 30 mg of prednisone so self decrease this to 15 mg a day which she has maintained on for the past few weeks.  He states he feels quite well on the regimen, his appetite is much better.  He has not had any ascites, no jaundice, no hepatic encephalopathy.  Otherwise in regards to the varices he was started on 20 mg of propranolol twice daily after  his endoscopy, he states he tolerates this quite well and is doing well on the regimen.  He previously had drank about 1-2 alcoholic beverages per day, he stopped that completely following his diagnosis of cirrhosis.  He has changed his diet significantly elevated a lot of fatty foods, he has been working on weight loss with diet and is lost about 28 pounds in the past year or so.  He states this has led to a significant reduction in his reflux problems and he is doing quite well with managing this with diet and Nexium 20 mg twice daily.  His abdominal pain has since resolved with changes in his diet.  He is seeing his general surgeon and we have held off on cholecystectomy and hiatal hernia repair given his diagnosis of cirrhosis.  He had an esophageal manometry previously in preparation for possible surgery of the hiatal hernia, that was normal.  We discussed long-term plans. He is scheduled to undergo EMR of large gastric polyp with Dr. Rush Landmark next week.   [place] 02/11/2020 - IMPRESSION: Somewhat limited exam due to overlying bowel gas.  Fatty infiltration of the liver.  Incompletely distended gallbladder with likely artificially thickened wall. Gallstones and gallbladder polyps are noted.  CT scan C / A / P - 05/21/20 -  IMPRESSION: 1. Somewhat coarse, nodular contour of the liver, suggestive of cirrhosis. There are no definitive stigmata of cirrhosis or portal hypertension. No obvious varices. No splenomegaly. 2. Moderate hiatal hernia with intrathoracic position of the gastric fundus. 3. Fat  stranding in the retroperitoneum, particularly about the celiac origin (series 2, image 63) and in the mesentery about the inferior mesenteric artery. Prominent subcentimeter retroperitoneal lymph nodes. Findings are nonspecific, possibly infectious or inflammatory although malignancy such as lymphoma is not strictly excluded. Consider follow-up CT in 3-6 months to assess for stability or  resolution. 4. Aortic Atherosclerosis (ICD10-I70.0  transjugular Liver biopsy 06/13/20 -  Positive for portal hypertension, gradient of 36mm mercury  Labs for chronic liver diseases: - mildly positive ANA, mildly positive SMA, mildly elevated IgG - hep A not immune - hep B not immune  Liver biopsy path 06/13/20 : FINAL MICROSCOPIC DIAGNOSIS:   A. LIVER, BIOPSY:  - Cirrhosis (stage 4 of 4) of uncertain etiology with patchy mild to  moderate interface hepatitis. See comment  - Minimally active steatohepatitis (grade 0-1 of 3)   COMMENT:   The liver biopsy shows a nodular architecture with areas of fibrotic  parenchymal collapse, highlighted by trichrome and reticulin. Septa  contain all the expected biliary and vascular structures, which are  histologically unremarkable. A ductular reaction, mild to moderate in  degree, is present. The septa contain mild to moderate mononuclear cell  infiltrate with few plasma cells. There is patchy mild to focally  moderate interface hepatitis. Macrovesicular steatosis affects  approximately 5% hepatocytes with focal ballooning degeneration.  Glycogenated nuclei or megamitochondria are not seen. Iron stain shows  no stainable iron. PASD stain is negative for diastase resistant  inclusions in hepatocytes.   The findings are consistent with cirrhosis of uncertain etiology. In  light of positive ANA and ASMA tests, findings are compatible with  chronic autoimmune hepatitis. Differential diagnosis can include burnt  out steatohepatitis. Clinical correlation is suggested   EGD 05/13/20 -  - A large 6 cm hiatal hernia was present. - The esophagus was difficult to insufflate and retain air due to the large hernia, but there is a suggestion of 2 columns of esophageal varices were found in the lower third of the esophagus without stigmata of bleeding. - The exam of the esophagus was otherwise normal. - A single large semi-sessile polyp was found in  the gastric fundus - diffusely ulcerated and inflamed, right at the [facility] on the lesser curvature at the beginning of the hernia sac. Suspect inflammatory in nature but biopsies were taken with a cold forceps for histology, rule out adenoma. - Multiple small sessile polyps were found in the gastric fundus and in the gastric body, inflammatory / hyperplastic in appearance. - Diffuse inflammation characterized by erythema, friability and granularity was found in the entire examined stomach. Biopsies were taken with a cold forceps for histology and to rule out H pylori. - The exam of the stomach was otherwise normal. - Nodular mucosa was found in the duodenal bulb grossly consistent with benign ectopic gastric mucosa. Biopsies were taken with a cold forceps for histology.   1. Surgical [P], duodenal polyps - POLYPOID DUODENAL MUCOSA WITH HYPERPLASTIC CHANGES - NEGATIVE FOR DYSPLASIA 2. Surgical [P], duodenal bulb - GASTRIC HETEROTOPIA 3. Surgical [P], gastric antrum and body - GASTRIC OXYNTIC MUCOSA WITH PARIETAL CELL HYPERPLASIA AS CAN BE SEEN IN HYPERGASTRINEMIC STATES SUCH AS PPI THERAPY. - GASTRIC ANTRAL MUCOSA WITH NO SPECIFIC HISTOPATHOLOGIC CHANGES - WARTHIN STARRY STAIN IS NEGATIVE FOR HELICOBACTER PYLORI 4. Surgical [P], gastric polyps - FRAGMENTS OF GASTRIC MUCOSA WITH GRANULATION TISSUE, CONSISTENT WITH ULCERATION - NEGATIVE FOR DYSPLASIA OR MALIGNANCY   Colonoscopy 02/23/19 - The perianal and digital rectal examinations were normal. -  A 2 mm polyp was found in the ascending colon. The polyp was sessile. The polyp was removed with a cold snare. Resection and retrieval were complete. - A 4 mm polyp was found in the sigmoid colon. The polyp was sessile. The polyp was removed with a cold snare. Resection and retrieval were complete. - Two sessile polyps were found in the rectum. The polyps were 3 to 4 mm in size. These polyps were removed with a cold snare. Resection and retrieval  were complete. - Multiple small-mouthed diverticula were found in the sigmoid colon. - Internal hemorrhoids were found during retroflexion. The hemorrhoids were moderate. - The exam was otherwise without abnormality.  Sessile serrated polyp x 1 at least - repeat colonoscopy in 5 years     Past Medical History:  Diagnosis Date  . Autoimmune hepatitis ([place])   . Cirrhosis ([place])   . Colon polyps   . Gallstones   . Gastric polyps   . GERD (gastroesophageal reflux disease)   . Hyperlipemia      Past Surgical History:  Procedure Laterality Date  . COLONOSCOPY  12/21/2015  . IR TRANSCATHETER BX  06/13/2020  . MOUTH SURGERY    . POLYPECTOMY    . SHOULDER ARTHROSCOPY     left, bone spur  . TONSILLECTOMY AND ADENOIDECTOMY    . VASECTOMY     Family History  Problem Relation Age of Onset  . Colon cancer Paternal Grandfather        mid 42's  . Heart disease Father   . Colon polyps Neg Hx   . Rectal cancer Neg Hx   . Stomach cancer Neg Hx   . Esophageal cancer Neg Hx   . Inflammatory bowel disease Neg Hx   . Liver disease Neg Hx   . Pancreatic cancer Neg Hx    Social History   Tobacco Use  . Smoking status: Former Smoker    Types: Cigars  . Smokeless tobacco: Never Used  Vaping Use  . Vaping Use: Never used  Substance Use Topics  . Alcohol use: Not Currently    Alcohol/week: 15.0 standard drinks    Types: 8 Cans of beer, 7 Shots of liquor per week  . Drug use: No   Current Outpatient Medications  Medication Sig Dispense Refill  . ALPRAZolam (XANAX) 0.5 MG tablet Take 0.5 mg by mouth at bedtime as needed for anxiety or sleep.     Marland Kitchen esomeprazole (NEXIUM) 20 MG capsule Take 20 mg by mouth 2 (two) times daily.     . predniSONE (DELTASONE) 10 MG tablet Take 10 mg by mouth 3 (three) times daily.    . propranolol (INDERAL) 20 MG tablet Take 1 tablet (20 mg total) by mouth 2 (two) times daily. (Patient taking differently: Take 20 mg by mouth daily. ) 60 tablet 11   Current  Facility-Administered Medications  Medication Dose Route Frequency Provider Last Rate Last Admin  . 0.9 %  sodium chloride infusion  500 mL Intravenous Continuous Tanina Barb, Carlota Raspberry, MD       Allergies  Allergen Reactions  . Sulfa Antibiotics Rash     Review of Systems: All systems reviewed and negative except where noted in HPI.   Lab Results  Component Value Date   WBC 6.4 06/13/2020   HGB 16.4 06/13/2020   HCT 49.6 06/13/2020   MCV 95.8 06/13/2020   PLT 248 06/13/2020    Lab Results  Component Value Date   CREATININE 0.68 05/24/2020   BUN  8 05/24/2020   NA 136 05/24/2020   K 3.8 05/24/2020   CL 105 05/24/2020   CO2 23 05/24/2020    Lab Results  Component Value Date   ALT 26 08/17/2020   AST 30 08/17/2020   ALKPHOS 128 (H) 08/17/2020   BILITOT 1.4 (H) 08/17/2020     Physical Exam: BP 130/80   Pulse (!) 56   Ht 5\' 11"  (1.803 m)   Wt 165 lb 6.4 oz (75 kg)   BMI 23.07 kg/m  Constitutional: Pleasant,well-developed, male in no acute distress. Abdominal: Soft, nondistended, nontender. . There are no masses palpable.  Extremities: no edema Lymphadenopathy: No cervical adenopathy noted. Neurological: Alert and oriented to person place and time. Skin: Skin is warm and dry. No rashes noted. Psychiatric: Normal mood and affect. Behavior is normal.   ASSESSMENT AND PLAN: 64 year old male here for reassessment of the following:  Autoimmune hepatitis / cirrhosis / esophageal varices - recently diagnosed following incidental noting of esophageal varices during EGD.  Labs and liver biopsy consistent with autoimmune hepatitis with portal hypertension.  I have referred him to hepatology and he has started therapy with low-dose prednisone given his intolerance of higher dosing.  I will repeat his LFTs today to see where they are trending and I will reach out to the hepatology clinic to see if when they plan on transitioning to thiopurine and getting him off steroids.   Sounds like he has follow-up with Hepatology in the next few weeks.  I discussed cirrhosis with him, long-term risks of decompensation and HCC.  He does have varices but other wise no decompensations.  He is tolerating propranolol well.  He is next due for [facility] screening in November.  He will continue to follow-up with hepatology.  I do recommend a third dose of the Covid vaccine if he is going to be immunosuppressed with thiopurines in the near future. He will continue to abstain from alcohol.  I will continue to see him every 6 months if not sooner with any issues.  He agreed with the plan.  Gastric polyps - likely large inflammatory gastric polyp, due for EMR with Dr. Rush Landmark at the hospital next week for removal.  He understands risks and benefits and wants to proceed  Gallstones - previously symptomatic, he since lost weight with dietary changes and no longer having symptoms.  I agree that in light of his diagnosis of cirrhosis, I would avoid elective surgeries if possible.  If the gallstones are causing him problems I would hold off on surgery for now.  He does have risks for recurrent biliary colic and choledocholithiasis which we discussed and he understands this, he will contact me if he develops any symptoms.  Also we will plan on holding off on hiatal hernia repair as long as his reflux remains controlled.  Abnormal CT scan - likely reactive changes although radiology recommending a follow-up exam within the next 3 months for reassessment of changes noted on CT in May. Will coordinate scheduling  Viola Cellar, MD [facility]

## 2020-08-17 NOTE — Telephone Encounter (Signed)
Called and scheduled pt for CT Adb and Pelvis with contrast at [facility] on Thursday Sept 9th at 10:30am , arrive at 10:15am, NPO 4 hours. Patient to pick up contrast and instructions at [facility].  Patient informed.  Will send information via MyChart and via mail.

## 2020-08-17 NOTE — Telephone Encounter (Signed)
-----   Message from Yetta Flock, MD sent at 08/17/2020  1:30 PM EDT ----- Regarding: follow up CT Hi Jan, I forgot to mention to the patient today that the radiologist had wanted a follow up CT scan done between August and November for a subtle abnormality (inflammation around celiac axis). Hopefully this is a benign finding and nothing concerning but can you please let him know I forgot to mention this to him at his visit today and would like to repeat the CT scan in the next 1-2 months if he is willing. Thanks

## 2020-08-18 ENCOUNTER — Other Ambulatory Visit (HOSPITAL_COMMUNITY)
Admission: RE | Admit: 2020-08-18 | Discharge: 2020-08-18 | Disposition: A | Discharge status: Home / Self Care | Payer: Medicare PPO | Source: Ambulatory Visit | Attending: Gastroenterology | Admitting: Gastroenterology

## 2020-08-18 DIAGNOSIS — Z01812 Encounter for preprocedural laboratory examination: Secondary | ICD-10-CM | POA: Insufficient documentation

## 2020-08-18 DIAGNOSIS — Z20822 Contact with and (suspected) exposure to covid-19: Secondary | ICD-10-CM | POA: Insufficient documentation

## 2020-08-18 LAB — SARS CORONAVIRUS 2 (TAT 6-24 HRS): SARS Coronavirus 2: NEGATIVE

## 2020-08-19 ENCOUNTER — Other Ambulatory Visit: Payer: Self-pay

## 2020-08-19 ENCOUNTER — Encounter (HOSPITAL_COMMUNITY): Payer: Self-pay | Admitting: Gastroenterology

## 2020-08-19 NOTE — Progress Notes (Signed)
Pt denies SOB, chest pain, and being under the care of a cardiologist. Pt stated that he is under the care of Dr. Marisue Humble, PCP. Pt denies having a stress test and cardiac cath. Pt denies having an EKG in the last year. Pt made aware to stop taking Aspirin (unless otherwise advised by surgeon), vitamins, fish oil and herbal medications. Do not take any NSAIDs ie: Ibuprofen, Advil, Naproxen (Aleve), Motrin, BC and Goody Powder. Pt reminded to quarantine. Pt verbalized understanding of all pre-op instructions.

## 2020-08-22 ENCOUNTER — Other Ambulatory Visit: Payer: Self-pay

## 2020-08-22 ENCOUNTER — Ambulatory Visit (HOSPITAL_COMMUNITY)
Admission: RE | Admit: 2020-08-22 | Discharge: 2020-08-22 | Disposition: A | Discharge status: Home / Self Care | Payer: Medicare PPO | Attending: Gastroenterology | Admitting: Gastroenterology

## 2020-08-22 ENCOUNTER — Ambulatory Visit (HOSPITAL_COMMUNITY): Payer: Medicare PPO | Admitting: Certified Registered Nurse Anesthetist

## 2020-08-22 ENCOUNTER — Encounter (HOSPITAL_COMMUNITY)
Admission: RE | Disposition: A | Discharge status: Home / Self Care | Payer: Self-pay | Source: Home / Self Care | Attending: Gastroenterology

## 2020-08-22 ENCOUNTER — Encounter (HOSPITAL_COMMUNITY): Payer: Self-pay | Admitting: Gastroenterology

## 2020-08-22 DIAGNOSIS — K7460 Unspecified cirrhosis of liver: Secondary | ICD-10-CM | POA: Insufficient documentation

## 2020-08-22 DIAGNOSIS — K754 Autoimmune hepatitis: Secondary | ICD-10-CM | POA: Insufficient documentation

## 2020-08-22 DIAGNOSIS — K922 Gastrointestinal hemorrhage, unspecified: Secondary | ICD-10-CM

## 2020-08-22 DIAGNOSIS — K219 Gastro-esophageal reflux disease without esophagitis: Secondary | ICD-10-CM | POA: Insufficient documentation

## 2020-08-22 DIAGNOSIS — F17290 Nicotine dependence, other tobacco product, uncomplicated: Secondary | ICD-10-CM | POA: Insufficient documentation

## 2020-08-22 DIAGNOSIS — K317 Polyp of stomach and duodenum: Secondary | ICD-10-CM

## 2020-08-22 DIAGNOSIS — K449 Diaphragmatic hernia without obstruction or gangrene: Secondary | ICD-10-CM | POA: Insufficient documentation

## 2020-08-22 DIAGNOSIS — Z8719 Personal history of other diseases of the digestive system: Secondary | ICD-10-CM | POA: Diagnosis not present

## 2020-08-22 DIAGNOSIS — Z882 Allergy status to sulfonamides status: Secondary | ICD-10-CM | POA: Insufficient documentation

## 2020-08-22 DIAGNOSIS — I8510 Secondary esophageal varices without bleeding: Secondary | ICD-10-CM

## 2020-08-22 HISTORY — PX: HEMOSTASIS CLIP PLACEMENT: SHX6857

## 2020-08-22 HISTORY — PX: UPPER ESOPHAGEAL ENDOSCOPIC ULTRASOUND (EUS): SHX6562

## 2020-08-22 HISTORY — PX: SUBMUCOSAL INJECTION: SHX5543

## 2020-08-22 HISTORY — DX: Disease of pericardium, unspecified: I31.9

## 2020-08-22 HISTORY — PX: ESOPHAGOGASTRODUODENOSCOPY (EGD) WITH PROPOFOL: SHX5813

## 2020-08-22 HISTORY — DX: Pneumonia, unspecified organism: J18.9

## 2020-08-22 HISTORY — PX: HEMOSTASIS CONTROL: SHX6838

## 2020-08-22 HISTORY — PX: POLYPECTOMY: SHX5525

## 2020-08-22 HISTORY — DX: Personal history of other diseases of the digestive system: Z87.19

## 2020-08-22 HISTORY — DX: Depression, unspecified: F32.A

## 2020-08-22 HISTORY — DX: Presence of spectacles and contact lenses: Z97.3

## 2020-08-22 SURGERY — ESOPHAGOGASTRODUODENOSCOPY (EGD) WITH PROPOFOL
Anesthesia: Monitor Anesthesia Care

## 2020-08-22 SURGERY — UPPER ESOPHAGEAL ENDOSCOPIC ULTRASOUND (EUS)
Anesthesia: Monitor Anesthesia Care

## 2020-08-22 MED ORDER — LACTATED RINGERS IV SOLN
INTRAVENOUS | Status: AC | PRN
  Administered 2020-08-22: 1000 mL via INTRAVENOUS

## 2020-08-22 MED ORDER — SODIUM CHLORIDE 0.9 % IV SOLN: INTRAVENOUS | Status: DC

## 2020-08-22 MED ORDER — PROPOFOL 10 MG/ML IV BOLUS
INTRAVENOUS | Status: DC | PRN
  Administered 2020-08-22: 30 mg via INTRAVENOUS
  Administered 2020-08-22 (×2): 20 mg via INTRAVENOUS

## 2020-08-22 MED ORDER — PHENYLEPHRINE 40 MCG/ML (10ML) SYRINGE FOR IV PUSH (FOR BLOOD PRESSURE SUPPORT)
PREFILLED_SYRINGE | INTRAVENOUS | Status: DC | PRN
  Administered 2020-08-22 (×3): 80 ug via INTRAVENOUS
  Administered 2020-08-22 (×3): 120 ug via INTRAVENOUS
  Administered 2020-08-22 (×2): 80 ug via INTRAVENOUS

## 2020-08-22 MED ORDER — SODIUM CHLORIDE (PF) 0.9 % IJ SOLN
PREFILLED_SYRINGE | INTRAMUSCULAR | Status: DC | PRN
  Administered 2020-08-22: 3 mL
  Administered 2020-08-22: 1 mL
  Administered 2020-08-22: 3 mL

## 2020-08-22 MED ORDER — PROPOFOL 500 MG/50ML IV EMUL
INTRAVENOUS | Status: DC | PRN
  Administered 2020-08-22: 125 ug/kg/min via INTRAVENOUS

## 2020-08-22 MED ORDER — ESOMEPRAZOLE MAGNESIUM 40 MG PO CPDR: 40.0000 mg | DELAYED_RELEASE_CAPSULE | Freq: Two times a day (BID) | ORAL | 3 refills | Status: AC

## 2020-08-22 SURGICAL SUPPLY — 15 items

## 2020-08-22 NOTE — Anesthesia Preprocedure Evaluation (Addendum)
Anesthesia Evaluation  Patient identified by MRN, date of birth, ID band  Reviewed: Allergy & Precautions, NPO status , Patient's Chart, lab work & pertinent test results  Airway Mallampati: II  TM Distance: >3 FB Neck ROM: Full    Dental   Multiple crowns:   Pulmonary Current Smoker,    Pulmonary exam normal breath sounds clear to auscultation       Cardiovascular Exercise Tolerance: Good negative cardio ROS Normal cardiovascular exam Rhythm:Regular Rate:Normal     Neuro/Psych PSYCHIATRIC DISORDERS Depression    GI/Hepatic GERD  ,(+) Cirrhosis       , Hepatitis -, Autoimmune  Endo/Other  negative endocrine ROS  Renal/GU negative Renal ROS     Musculoskeletal negative musculoskeletal ROS (+)   Abdominal   Peds  Hematology negative hematology ROS (+)   Anesthesia Other Findings   Reproductive/Obstetrics negative OB ROS                            Anesthesia Physical Anesthesia Plan  ASA: III  Anesthesia Plan: MAC   Post-op Pain Management:    Induction:   PONV Risk Score and Plan: 0 and Propofol infusion and TIVA  Airway Management Planned: Natural Airway, Simple Face Mask and Nasal Cannula  Additional Equipment:   Intra-op Plan:   Post-operative Plan:   Informed Consent: I have reviewed the patients History and Physical, chart, labs and discussed the procedure including the risks, benefits and alternatives for the proposed anesthesia with the patient or authorized representative who has indicated his/her understanding and acceptance.       Plan Discussed with:   Anesthesia Plan Comments:        Anesthesia Quick Evaluation

## 2020-08-22 NOTE — Anesthesia Procedure Notes (Signed)
Procedure Name: MAC Date/Time: 08/22/2020 11:16 AM Performed by: Inda Coke, CRNA Pre-anesthesia Checklist: Patient identified, Emergency Drugs available, Suction available, Timeout performed and Patient being monitored Patient Re-evaluated:Patient Re-evaluated prior to induction Oxygen Delivery Method: Nasal cannula Induction Type: IV induction Dental Injury: Teeth and Oropharynx as per pre-operative assessment

## 2020-08-22 NOTE — Op Note (Signed)
[facility] Patient Name: Jeffrey Hanson Procedure Date : 08/22/2020 MRN: [id number] Attending MD: Justice Britain , MD Date of Birth: [birth year] CSN: [id number] Age: 64 Admit Type: Outpatient Procedure:                Upper EUS Indications:              Treatment of bleeding gastric lesion, Therapeutic                            procedure, Gastric polyps, For therapy of gastric                            polyps Providers:                Justice Britain, MD, Carlyn Reichert, RN, Janee Morn, Technician Referring MD:             Carlota Raspberry. Armbruster, MD Medicines:                Monitored Anesthesia Care Complications:            Minor bleeding - stopped spontaneously Estimated Blood Loss:     Estimated blood loss was minimal. Procedure:                Pre-Anesthesia Assessment:                           - Prior to the procedure, a History and Physical                            was performed, and patient medications and                            allergies were reviewed. The patient's tolerance of                            previous anesthesia was also reviewed. The risks                            and benefits of the procedure and the sedation                            options and risks were discussed with the patient.                            All questions were answered, and informed consent                            was obtained. Prior Anticoagulants: The patient has                            taken no previous anticoagulant or antiplatelet  agents. ASA Grade Assessment: III - A patient with                            severe systemic disease. After reviewing the risks                            and benefits, the patient was deemed in                            satisfactory condition to undergo the procedure.                           After obtaining informed consent, the endoscope was                             passed under direct vision. Throughout the                            procedure, the patient's blood pressure, pulse, and                            oxygen saturations were monitored continuously. The                            [id number] ([id number]) Olympus therapeutic                            gastroscope was introduced through the mouth, and                            advanced to the second part of duodenum. The                            [id number] ([id number]) Olympus Radial EUS scope was                            introduced through the mouth, and advanced to the                            second part of duodenum. The upper GI endoscopy was                            somewhat difficult due to excessive bleeding.                            Successful completion of the procedure was aided by                            performing the maneuvers documented (below) in this                            report. The patient tolerated the procedure. Scope In: Scope Out: Findings:      No gross lesions were noted in  the proximal esophagus and in the mid       esophagus.      Grade I varices were found in the distal esophagus.      The Z-line was regular and was found 35 cm from the incisors.      A large hiatal hernia was present (approximately 6 cm in length).      Multiple 5 to 15 mm pedunculated and sessile polyps with no active       bleeding but with stigmata of recent bleeding were found in the cardia,       in the gastric fundus and in the gastric body. These were distributed       throughout the stomach including within the hiatal hernia.      A single 30 mm semi-pedunculated polyp with a broad based and clearly       inflammatory head with evidence of recent stigmata of bleeding was found       in the gastric fundus into the proximal portion of the greater curvature       of the gastric body. After the EUS showed no evidence of gastric       varices, decision made to proceed with  attempt at resection. Retrograde       view was not adequate for attempt at resection so we proceeded with a a       forward view in attempt of resection. The base was successfully injected       with 3 mL of a 1:100000 solution of epinephrine for a lift polypectomy       attempt. There was blanching of the lesion. I felt that endoloop would       give [place] the best opportunity to minimize risk of bleeding prior to hot       snare mucosal resection. An Olympus polyloop was maneuvered over the       polyp stalk and slowly closed at the mucosal attachment. However, due to       the inflammatory nature of the polyp, the endoloop went through the base       and active bleeding was noted. In order to aid with hemostasis, I began       to inject Epinephrine once again as well as proceed with hemoclip       placement. In total, I injected an additional 5 mL of a 1:100000       solution of epinephrine for hemostasis to the base and region of active       bleeding caused by the procedure. In total, I used twelve hemostatic       clips (2 did not adequately remain on the gastric mucosa (MR       conditional). There was no bleeding at the end of the procedure and we       monitored the area for more than 20 minutes post last Epinephrine       injection. Retrieval was complete.      Other than small nodules previously biopsied and negative for adenoma,       no gross lesions were noted in the duodenal bulb, in the first portion       of the duodenum and in the second portion of the duodenum.      ENDOSONOGRAPHIC FINDING: :      A hypoechoic pedunculated lesion was identified endosonographically in       the fundus of the stomach. The lesion begins at 4  cm from the       gastroesophageal junction and measured 25 mm by 16 mm in maximal       cross-sectional diameter. The outer margins were irregular.      No evidence of gastric varices on EUS evaluation. Impression:               EGD Impression:                            - No gross lesions in esophagus proximally. Grade I                            esophageal varices distally. Z-line regular, 35 cm                            from the incisors.                           - Large hiatal hernia.                           - Multiple gastric polyps - inflammatory in nature                            with some showing evidence of stigmata of previous                            oozing..                           - A large, semi-pedunculated, broad-based gastric                            polyp found in the fundus-proximal gastric body.                            Attempt at resection with polyloop and with dilute                            epinephrine led to polyloop disecting the tissue                            without ability to proceed with hot snare cautery.                            Bleeding occurred. For hemostasis, additional                            epinephrine and hemoclips were placed. The tissue                            was retrieved from the resection.                           - Small duodenal nodules previously benign, found  but no gross lesions in the duodenal bulb, in the                            first portion of the duodenum and in the second                            portion of the duodenum.                           EUS Impression:                           - Pedunculated polypoid lesion noted in the fundus                            of the stomach.                           - No evidence of gastric varices. Recommendation:           - The patient will be observed post-procedure,                            until all discharge criteria are met.                           - Patient may have discomfort from amount of                            Epinephrine injected but will need to be evaluated                            and monitored.                           - Discharge patient to home.                            - Patient has a contact number available for                            emergencies. The signs and symptoms of potential                            delayed complications were discussed with the                            patient. Return to normal activities tomorrow.                            Written discharge instructions were provided to the                            patient.                           -  Full liquid diet today. Soft diet tomorrow.                           - Begin Nexium 40 mg twice daily for next 48-month                           and then decrease to 40 mg daily for 16-monthThen                            may take 20 mg daily for 1-14-monthd then stop.                           - No NSAIDs or Aspirin for 1-week to decrease risk                            of post-polypectomy bleeding.                           - Await pathology results.                           - Repeat upper endoscopy for retreatment should                            patient develop evidence of Iron Deficiency anemia                            in setting of multiple polyps still remaining.                           - Monitor for signs/symptoms of bleeding (coffee                            ground emesis, bloody vomitus, black stools) at                            home and if this occurs would recommend calling                            [place] office and understanding need of coming                            into the hospital, should additional therapies be                            required for treatment.                           - The findings and recommendations were discussed                            with the patient.                           -  The findings and recommendations were discussed                            with the patient's family. Procedure Code(s):        --- Professional ---                           [phone], Esophagogastroduodenoscopy, flexible,                             transoral; with control of bleeding, any method Diagnosis Code(s):        --- Professional ---                           I85.00, Esophageal varices without bleeding                           K44.9, Diaphragmatic hernia without obstruction or                            gangrene                           K31.7, Polyp of stomach and duodenum                           K92.2, Gastrointestinal hemorrhage, unspecified CPT copyright 2019 American Medical Association. All rights reserved. The codes documented in this report are preliminary and upon coder review may  be revised to meet current compliance requirements. Justice Britain, MD 08/22/2020 1:28:03 PM Number of Addenda: 0

## 2020-08-22 NOTE — Anesthesia Postprocedure Evaluation (Signed)
Anesthesia Post Note  Patient: Jeffrey Hanson  Procedure(s) Performed: ESOPHAGOGASTRODUODENOSCOPY (EGD) WITH PROPOFOL (N/A ) SUBMUCOSAL INJECTION HEMOSTASIS CONTROL HEMOSTASIS CLIP PLACEMENT FOREIGN BODY REMOVAL POLYPECTOMY     Patient location during evaluation: PACU Anesthesia Type: MAC Level of consciousness: awake and alert Pain management: pain level controlled Vital Signs Assessment: post-procedure vital signs reviewed and stable Respiratory status: spontaneous breathing, nonlabored ventilation, respiratory function stable and patient connected to nasal cannula oxygen Cardiovascular status: stable and blood pressure returned to baseline Postop Assessment: no apparent nausea or vomiting Anesthetic complications: no   No complications documented.  Last Vitals:  Vitals:   08/22/20 1310 08/22/20 1320  BP: 107/72 105/70  Pulse: (!) 48 (!) 49  Resp: 10 12  Temp:    SpO2: 97% 97%    Last Pain:  Vitals:   08/22/20 1320  TempSrc:   PainSc: 0-No pain                 Merlinda Frederick

## 2020-08-22 NOTE — H&P (Signed)
GASTROENTEROLOGY PROCEDURE H&P NOTE   Primary Care Physician: Gaynelle Arabian, MD  HPI: Jeffrey Hanson is a 64 y.o. male who presents for EGD/EUS with EMR attempt of large gastric polyp.  Past Medical History:  Diagnosis Date  . Autoimmune hepatitis ([place])   . Cirrhosis ([place])   . Colon polyps   . Depression    situational  . Gallstones   . Gastric polyps   . GERD (gastroesophageal reflux disease)   . History of hiatal hernia   . Hyperlipemia   . Pericarditis   . Pneumonia   . Wears glasses    Past Surgical History:  Procedure Laterality Date  . COLONOSCOPY  12/21/2015  . IR TRANSCATHETER BX  06/13/2020  . MOUTH SURGERY    . POLYPECTOMY    . SHOULDER ARTHROSCOPY     left, bone spur  . TONSILLECTOMY AND ADENOIDECTOMY    . VASECTOMY    . wisdom teeth extractions     Current Facility-Administered Medications  Medication Dose Route Frequency Provider Last Rate Last Admin  . lactated ringers infusion    Continuous PRN Mansouraty, Telford Nab., MD 20 mL/hr at 08/22/20 1000 1,000 mL at 08/22/20 1000   Allergies  Allergen Reactions  . Sulfa Antibiotics Rash   Family History  Problem Relation Age of Onset  . Colon cancer Paternal Grandfather        mid 25's  . Heart disease Father   . Colon polyps Neg Hx   . Rectal cancer Neg Hx   . Stomach cancer Neg Hx   . Esophageal cancer Neg Hx   . Inflammatory bowel disease Neg Hx   . Liver disease Neg Hx   . Pancreatic cancer Neg Hx    Social History   Socioeconomic History  . Marital status: Married    Spouse name: Not on file  . Number of children: Not on file  . Years of education: Not on file  . Highest education level: Not on file  Occupational History  . Not on file  Tobacco Use  . Smoking status: Current Some Day Smoker    Types: Cigars  . Smokeless tobacco: Never Used  Vaping Use  . Vaping Use: Never used  Substance and Sexual Activity  . Alcohol use: Not Currently    Alcohol/week: 15.0 standard drinks     Types: 8 Cans of beer, 7 Shots of liquor per week  . Drug use: No  . Sexual activity: Not on file  Other Topics Concern  . Not on file  Social History Narrative  . Not on file   Social Determinants of Health   Financial Resource Strain:   . Difficulty of Paying Living Expenses: Not on file  Food Insecurity:   . Worried About Charity fundraiser in the Last Year: Not on file  . Ran Out of Food in the Last Year: Not on file  Transportation Needs:   . Lack of Transportation (Medical): Not on file  . Lack of Transportation (Non-Medical): Not on file  Physical Activity:   . Days of Exercise per Week: Not on file  . Minutes of Exercise per Session: Not on file  Stress:   . Feeling of Stress : Not on file  Social Connections:   . Frequency of Communication with Friends and Family: Not on file  . Frequency of Social Gatherings with Friends and Family: Not on file  . Attends Religious Services: Not on file  . Active Member of Clubs or  Organizations: Not on file  . Attends Archivist Meetings: Not on file  . Marital Status: Not on file  Intimate Partner Violence:   . Fear of Current or Ex-Partner: Not on file  . Emotionally Abused: Not on file  . Physically Abused: Not on file  . Sexually Abused: Not on file    Physical Exam: Vital signs in last 24 hours: Temp:  [98 F (36.7 C)] 98 F (36.7 C) (08/23 0958) Pulse Rate:  [54] 54 (08/23 0958) Resp:  [14] 14 (08/23 0958) BP: (136)/(87) 136/87 (08/23 0958) SpO2:  [99 %] 99 % (08/23 0958) Weight:  [75 kg] 75 kg (08/23 0958)   GEN: NAD EYE: Sclerae anicteric ENT: MMM CV: Non-tachycardic GI: Soft, NT/ND NEURO:  Alert & Oriented x 3  Lab Results: No results for input(s): WBC, HGB, HCT, PLT in the last 72 hours. BMET No results for input(s): NA, K, CL, CO2, GLUCOSE, BUN, CREATININE, CALCIUM in the last 72 hours. LFT No results for input(s): PROT, ALBUMIN, AST, ALT, ALKPHOS, BILITOT, BILIDIR, IBILI in the last 72  hours. PT/INR No results for input(s): LABPROT, INR in the last 72 hours.   Impression / Plan: This is a 64 y.o.male who presents for EGD/EUS with EMR attempt of large gastric polyp.  The risks of an EUS including intestinal perforation, bleeding, infection, aspiration, and medication effects were discussed as was the possibility it may not give a definitive diagnosis if a biopsy is performed.  When a biopsy of the pancreas is done as part of the EUS, there is an additional risk of pancreatitis at the rate of about 1-2%.  It was explained that procedure related pancreatitis is typically mild, although it can be severe and even life threatening, which is why we do not perform random pancreatic biopsies and only biopsy a lesion/area we feel is concerning enough to warrant the risk.  The risks and benefits of endoscopic evaluation were discussed with the patient; these include but are not limited to the risk of perforation, infection, bleeding, missed lesions, lack of diagnosis, severe illness requiring hospitalization, as well as anesthesia and sedation related illnesses.  The patient is agreeable to proceed.    Justice Britain, MD [facility] Office # [phone]

## 2020-08-22 NOTE — Transfer of Care (Signed)
Immediate Anesthesia Transfer of Care Note  Patient: Jeffrey Hanson  Procedure(s) Performed: ESOPHAGOGASTRODUODENOSCOPY (EGD) WITH PROPOFOL (N/A ) ENDOSCOPIC MUCOSAL RESECTION (N/A ) SUBMUCOSAL INJECTION HEMOSTASIS CONTROL HEMOSTASIS CLIP PLACEMENT FOREIGN BODY REMOVAL  Patient Location: Endoscopy Unit  Anesthesia Type:MAC  Level of Consciousness: awake and drowsy  Airway & Oxygen Therapy: Patient Spontanous Breathing and Patient connected to nasal cannula oxygen  Post-op Assessment: Report given to RN and Post -op Vital signs reviewed and stable  Post vital signs: Reviewed and stable  Last Vitals:  Vitals Value Taken Time  BP 100/58 08/22/20 1250  Temp    Pulse 65 08/22/20 1250  Resp 19 08/22/20 1251  SpO2 97 % 08/22/20 1250  Vitals shown include unvalidated device data.  Last Pain:  Vitals:   08/22/20 1250  TempSrc: Oral  PainSc: 0-No pain         Complications: No complications documented.

## 2020-08-22 NOTE — Discharge Instructions (Signed)
YOU HAD AN ENDOSCOPIC PROCEDURE TODAY: Refer to the procedure report and other information in the discharge instructions given to you for any specific questions about what was found during the examination. If this information does not answer your questions, please call [place] office at [phone] to clarify.   YOU SHOULD EXPECT: Some feelings of bloating in the abdomen. Passage of more gas than usual. Walking can help get rid of the air that was put into your GI tract during the procedure and reduce the bloating. If you had a lower endoscopy (such as a colonoscopy or flexible sigmoidoscopy) you may notice spotting of blood in your stool or on the toilet paper. Some abdominal soreness may be present for a day or two, also.  DIET: Your first meal following the procedure should be a light meal and then it is ok to progress to your normal diet. A half-sandwich or bowl of soup is an example of a good first meal. Heavy or fried foods are harder to digest and may make you feel nauseous or bloated. Drink plenty of fluids but you should avoid alcoholic beverages for 24 hours. If you had a esophageal dilation, please see attached instructions for diet.    ACTIVITY: Your care partner should take you home directly after the procedure. You should plan to take it easy, moving slowly for the rest of the day. You can resume normal activity the day after the procedure however YOU SHOULD NOT DRIVE, use power tools, machinery or perform tasks that involve climbing or major physical exertion for 24 hours (because of the sedation medicines used during the test).   SYMPTOMS TO REPORT IMMEDIATELY: A gastroenterologist can be reached at any hour. Please call [phone]  for any of the following symptoms:   Following upper endoscopy (EGD, EUS, ERCP, esophageal dilation) Vomiting of blood or coffee ground material  New, significant abdominal pain  New, significant chest pain or pain under the shoulder blades  Painful or  persistently difficult swallowing  New shortness of breath  Black, tarry-looking or red, bloody stools  FOLLOW UP:  If any biopsies were taken you will be contacted by phone or by letter within the next 1-3 weeks. Call [phone]  if you have not heard about the biopsies in 3 weeks.  Please also call with any specific questions about appointments or follow up tests.  

## 2020-08-23 ENCOUNTER — Encounter (HOSPITAL_COMMUNITY): Payer: Self-pay | Admitting: Gastroenterology

## 2020-08-23 ENCOUNTER — Encounter: Payer: Self-pay | Admitting: Gastroenterology

## 2020-08-23 LAB — SURGICAL PATHOLOGY

## 2020-08-30 ENCOUNTER — Telehealth: Payer: Self-pay | Admitting: Gastroenterology

## 2020-08-30 ENCOUNTER — Other Ambulatory Visit: Payer: Self-pay

## 2020-08-30 DIAGNOSIS — R933 Abnormal findings on diagnostic imaging of other parts of digestive tract: Secondary | ICD-10-CM

## 2020-08-30 NOTE — Telephone Encounter (Signed)
Tedra Coupe called from Radiology dept. Requesting order for I-Stat Creatinine

## 2020-08-30 NOTE — Progress Notes (Signed)
Per radiology, patient needs a creatine prior to CT scan. Order entered

## 2020-08-30 NOTE — Telephone Encounter (Signed)
Per radiology, patient needs a creatine prior to CT scan. Order entered

## 2020-08-31 NOTE — Telephone Encounter (Signed)
Tedra Coupe called in reference to the order created yesterday it should specify I-Stat. Thank you

## 2020-08-31 NOTE — Addendum Note (Signed)
Addended by: Roetta Sessions on: 08/31/2020 08:21 AM   Modules accepted: Orders

## 2020-09-01 ENCOUNTER — Ambulatory Visit (HOSPITAL_COMMUNITY)
Admission: RE | Admit: 2020-09-01 | Discharge: 2020-09-01 | Disposition: A | Discharge status: Home / Self Care | Payer: Medicare PPO | Source: Ambulatory Visit | Attending: Gastroenterology | Admitting: Gastroenterology

## 2020-09-01 ENCOUNTER — Other Ambulatory Visit: Payer: Self-pay

## 2020-09-01 DIAGNOSIS — R933 Abnormal findings on diagnostic imaging of other parts of digestive tract: Secondary | ICD-10-CM | POA: Insufficient documentation

## 2020-09-01 LAB — POCT I-STAT CREATININE: Creatinine, Ser: 0.9 mg/dL (ref 0.61–1.24)

## 2020-09-01 IMAGING — CT CT ABD-PELV W/ CM
2 of 5 series · 15 of 46 positions shown, 17 images · IV contrast (omnipaque)
Comparison: [DATE]

CLINICAL DATA: Cirrhosis. Autoimmune hepatitis. Hiatal hernia.
Gastric polyps.

EXAM:
CT ABDOMEN AND PELVIS WITH CONTRAST
TECHNIQUE: Multidetector CT imaging of the abdomen and pelvis was performed
using the standard protocol following bolus administration of
intravenous contrast.
CONTRAST:  100mL OMNIPAQUE IOHEXOL 300 MG/ML  SOLN

[Series 2: axial st · axial · 0.76mm/px · z∈[-282,+138]mm · 12 of 100 slices shown, 14 images]
[im 8/100  soft-tissue]
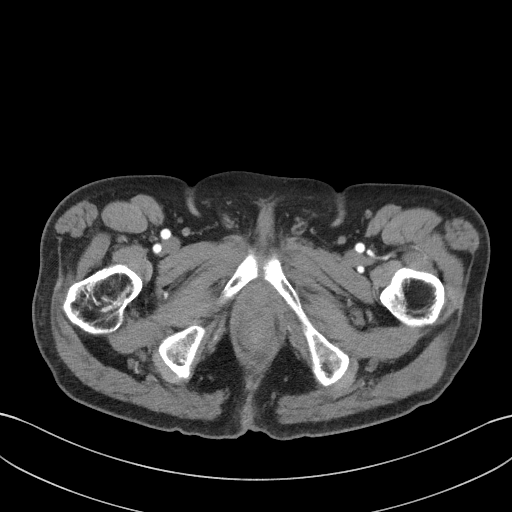
[im 8/100  bone]
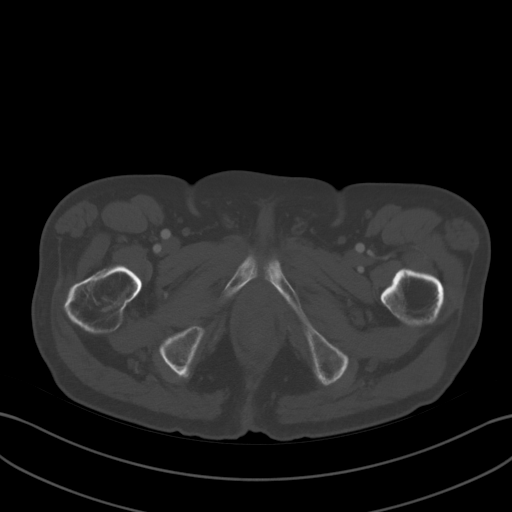
[im 16/100  soft-tissue]
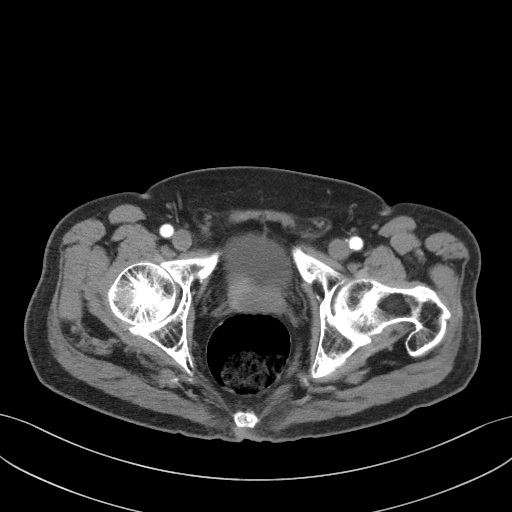
[im 23/100  soft-tissue]
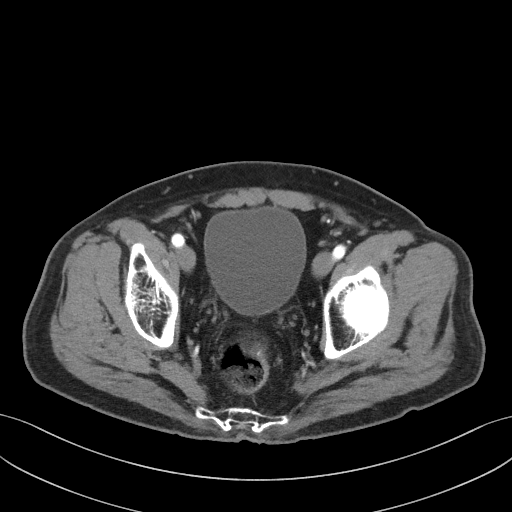
[im 31/100  soft-tissue]
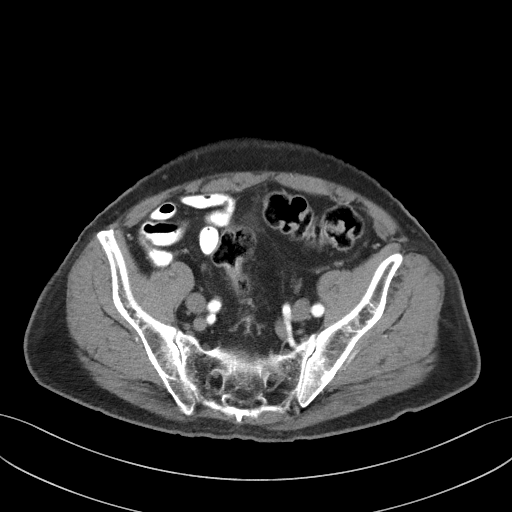
[im 39/100  soft-tissue]
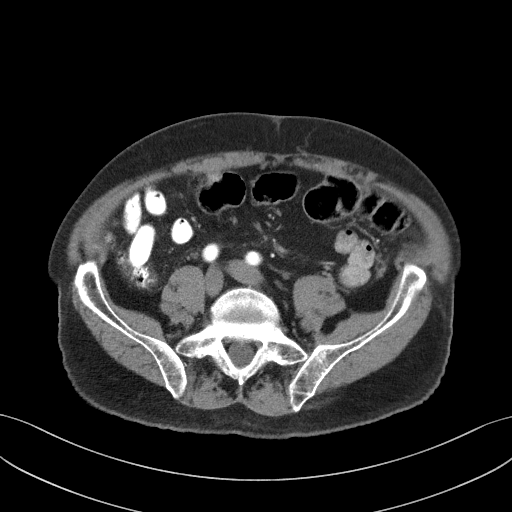
[im 46/100  soft-tissue]
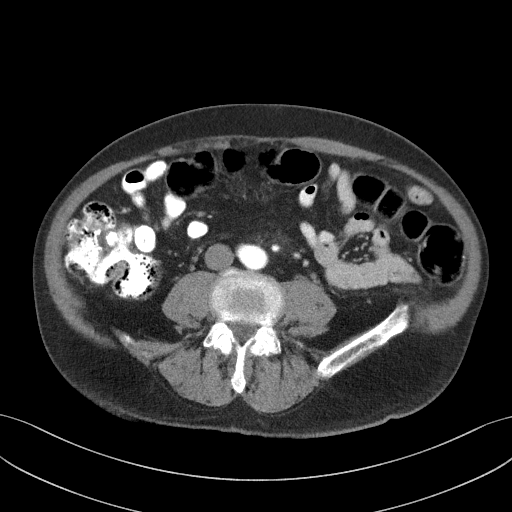
[im 54/100  soft-tissue]
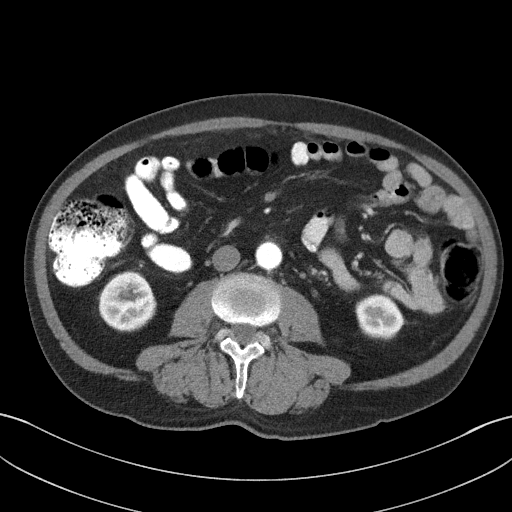
[im 61/100  soft-tissue]
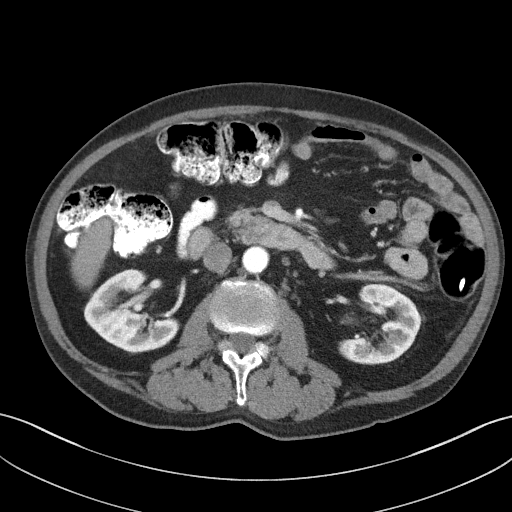
[im 69/100  soft-tissue]
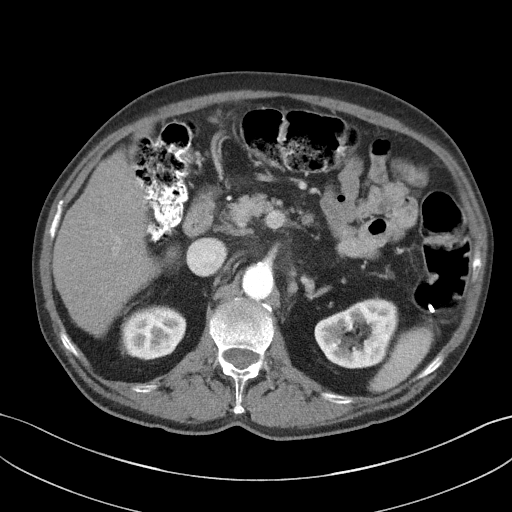
[im 69/100  bone]
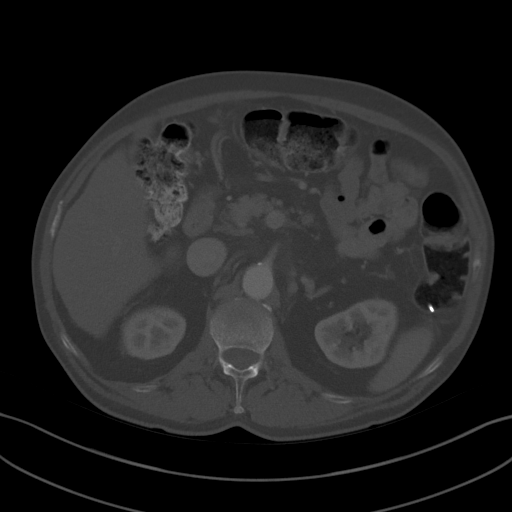
[im 77/100  soft-tissue]
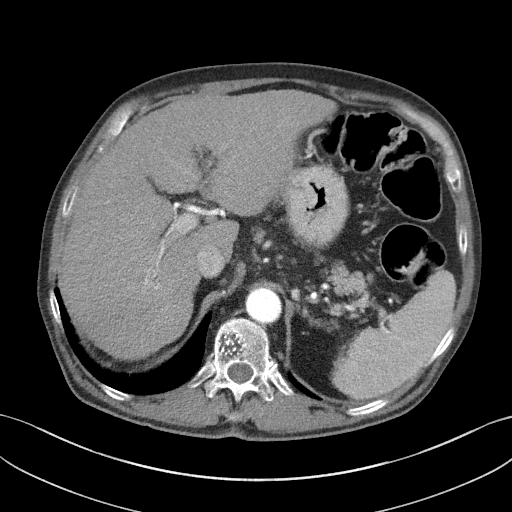
[im 84/100  soft-tissue]
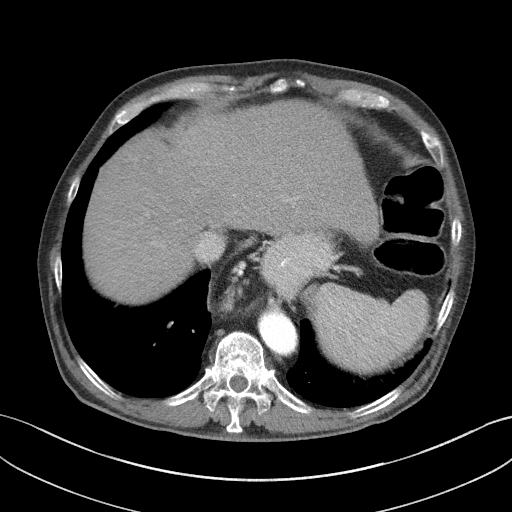
[im 92/100  soft-tissue]
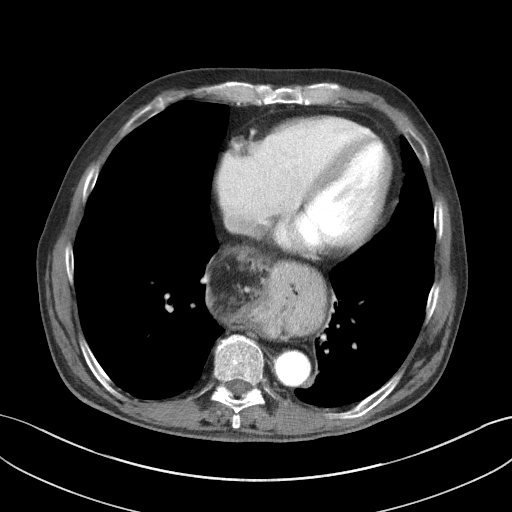

[Series 5: coronal st · coronal · 0.75mm/px · 3 of 96 slices shown]
[im 32/96  soft-tissue]
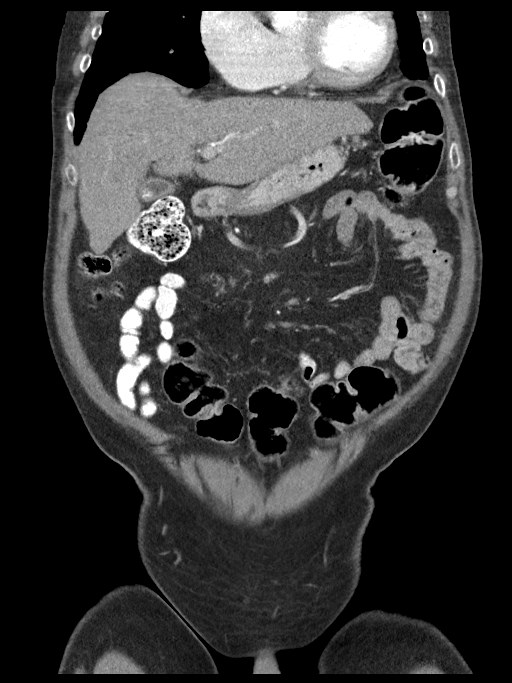
[im 43/96  soft-tissue]
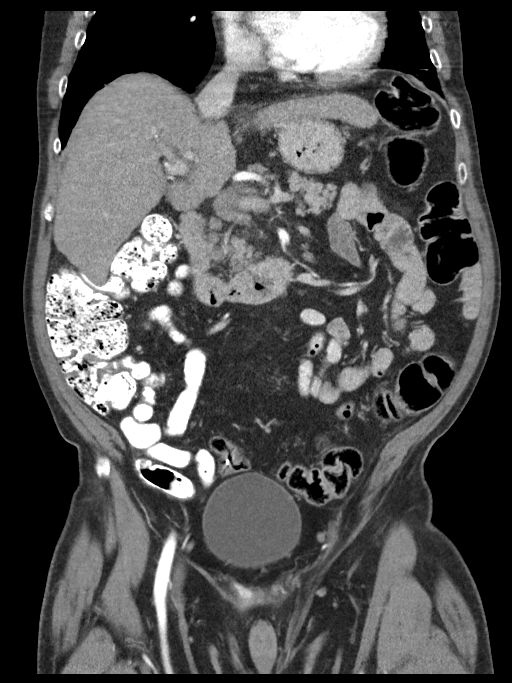
[im 53/96  soft-tissue]
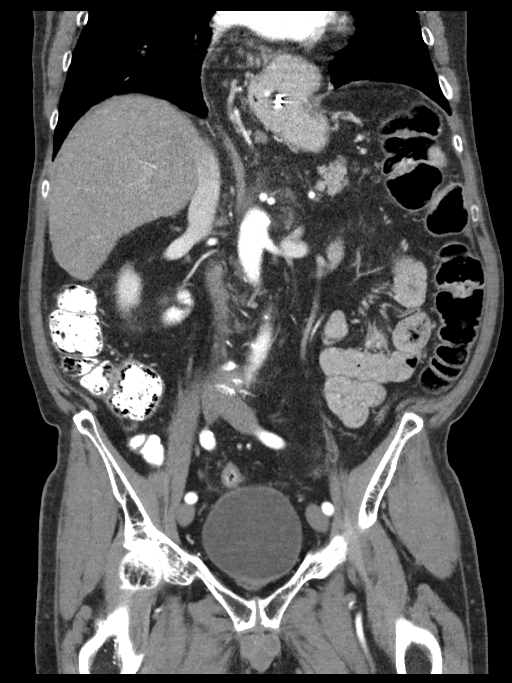

[15 of 46 positions shown; findings below may reference images not displayed]

FINDINGS: Lower Chest: No acute findings.

Hepatobiliary: Hepatic cirrhosis is again demonstrated. No hepatic
masses identified. Tiny calcified gallstones are noted, however
there is no evidence of cholecystitis or biliary ductal dilatation.

Pancreas:  No mass or inflammatory changes.

Spleen: Within normal limits in size and appearance.

Adrenals/Urinary Tract: No masses identified. No evidence of
ureteral calculi or hydronephrosis.

Stomach/Bowel: A moderate size hiatal hernia is again seen. No
evidence of obstruction, inflammatory process or abnormal fluid
collections.

Vascular/Lymphatic: No pathologically enlarged lymph nodes. No
abdominal aortic aneurysm. Upper abdominal portosystemic venous
collaterals are again seen, consistent with portal venous
hypertension.

Reproductive:  Stable mildly enlarged prostate.

Other: Mild stranding of retroperitoneal fat in the upper abdomen
remains stable. This is nonspecific but likely related to portal
venous hypertension.

Musculoskeletal: No suspicious bone lesions identified. Old mild
compression fracture of the T11 vertebral body again noted.
IMPRESSION: Hepatic cirrhosis and findings of portal venous hypertension. Mild
soft tissue stranding in upper abdominal retroperitoneal fat is
unchanged, and likely secondary to portal venous hypertension.

No evidence of hepatic neoplasm or other acute findings.

Stable moderate hiatal hernia.

Cholelithiasis. No radiographic evidence of cholecystitis.

Stable mildly enlarged prostate.

## 2020-09-01 MED ORDER — IOHEXOL 300 MG/ML  SOLN
100.0000 mL | Freq: Once | INTRAMUSCULAR | Status: AC | PRN
  Administered 2020-09-01: 100 mL via INTRAVENOUS

## 2020-09-02 ENCOUNTER — Ambulatory Visit (HOSPITAL_COMMUNITY): Payer: Medicare PPO

## 2020-09-08 ENCOUNTER — Other Ambulatory Visit (HOSPITAL_COMMUNITY): Payer: Medicare PPO

## 2020-09-30 DIAGNOSIS — R61 Generalized hyperhidrosis: Secondary | ICD-10-CM | POA: Diagnosis not present

## 2020-10-17 DIAGNOSIS — K754 Autoimmune hepatitis: Secondary | ICD-10-CM | POA: Diagnosis not present

## 2020-10-27 DIAGNOSIS — Z20822 Contact with and (suspected) exposure to covid-19: Secondary | ICD-10-CM | POA: Diagnosis not present

## 2020-10-28 ENCOUNTER — Other Ambulatory Visit: Payer: Medicare PPO

## 2020-10-28 DIAGNOSIS — Z20822 Contact with and (suspected) exposure to covid-19: Secondary | ICD-10-CM

## 2020-10-29 LAB — SARS-COV-2, NAA 2 DAY TAT

## 2020-10-29 LAB — NOVEL CORONAVIRUS, NAA: SARS-CoV-2, NAA: NOT DETECTED

## 2020-11-16 DIAGNOSIS — K754 Autoimmune hepatitis: Secondary | ICD-10-CM | POA: Diagnosis not present

## 2020-11-23 DIAGNOSIS — H2513 Age-related nuclear cataract, bilateral: Secondary | ICD-10-CM | POA: Diagnosis not present

## 2020-11-23 DIAGNOSIS — H52203 Unspecified astigmatism, bilateral: Secondary | ICD-10-CM | POA: Diagnosis not present

## 2020-12-05 ENCOUNTER — Ambulatory Visit (INDEPENDENT_AMBULATORY_CARE_PROVIDER_SITE_OTHER): Payer: Medicare PPO | Admitting: Gastroenterology

## 2020-12-05 DIAGNOSIS — Z23 Encounter for immunization: Secondary | ICD-10-CM | POA: Diagnosis not present

## 2020-12-08 DIAGNOSIS — I8500 Esophageal varices without bleeding: Secondary | ICD-10-CM | POA: Diagnosis not present

## 2020-12-08 DIAGNOSIS — K754 Autoimmune hepatitis: Secondary | ICD-10-CM | POA: Diagnosis not present

## 2020-12-08 DIAGNOSIS — K7469 Other cirrhosis of liver: Secondary | ICD-10-CM | POA: Diagnosis not present

## 2020-12-08 DIAGNOSIS — K766 Portal hypertension: Secondary | ICD-10-CM | POA: Diagnosis not present

## 2020-12-08 DIAGNOSIS — K7290 Hepatic failure, unspecified without coma: Secondary | ICD-10-CM | POA: Diagnosis not present

## 2020-12-13 DIAGNOSIS — K7469 Other cirrhosis of liver: Secondary | ICD-10-CM | POA: Diagnosis not present

## 2020-12-13 DIAGNOSIS — K754 Autoimmune hepatitis: Secondary | ICD-10-CM | POA: Diagnosis not present

## 2020-12-20 DIAGNOSIS — R972 Elevated prostate specific antigen [PSA]: Secondary | ICD-10-CM | POA: Diagnosis not present

## 2020-12-20 DIAGNOSIS — N402 Nodular prostate without lower urinary tract symptoms: Secondary | ICD-10-CM | POA: Diagnosis not present

## 2020-12-28 ENCOUNTER — Other Ambulatory Visit: Payer: Self-pay | Admitting: Urology

## 2020-12-28 DIAGNOSIS — N402 Nodular prostate without lower urinary tract symptoms: Secondary | ICD-10-CM

## 2020-12-28 DIAGNOSIS — R972 Elevated prostate specific antigen [PSA]: Secondary | ICD-10-CM

## 2021-01-22 ENCOUNTER — Other Ambulatory Visit: Payer: Self-pay

## 2021-01-22 ENCOUNTER — Ambulatory Visit
Admission: RE | Admit: 2021-01-22 | Discharge: 2021-01-22 | Disposition: A | Discharge status: Home / Self Care | Payer: Medicare PPO | Source: Ambulatory Visit | Attending: Urology | Admitting: Urology

## 2021-01-22 DIAGNOSIS — R972 Elevated prostate specific antigen [PSA]: Secondary | ICD-10-CM

## 2021-01-22 DIAGNOSIS — N402 Nodular prostate without lower urinary tract symptoms: Secondary | ICD-10-CM | POA: Diagnosis not present

## 2021-01-22 IMAGING — MR MR PROSTATE WO/W CM
12 series · 48 of 48 positions shown · IV contrast (multihance)
Comparison: None.

CLINICAL DATA: Nodular prostate, PSA 4.0, no biopsy

EXAM:
MR PROSTATE WITHOUT AND WITH CONTRAST
TECHNIQUE: Multiplanar multisequence MRI images were obtained of the pelvis
centered about the prostate. Pre and post contrast images were
obtained.
CONTRAST:  15mL MULTIHANCE GADOBENATE DIMEGLUMINE 529 MG/ML IV SOLN

[Series 3: T2 · coronal · 3.0mm · 0.56mm/px · 1 of 23 slices shown (1 of 3)]
[im 1/23]
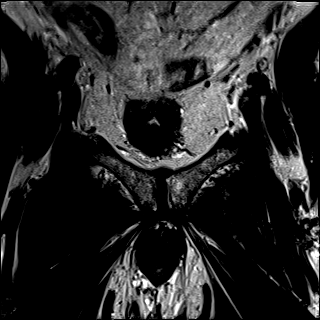

[Series 4: T1 · axial · 5.0mm · 1.25mm/px · z∈[-81,+134]mm · 2 of 88 slices shown]
[im 1/88]
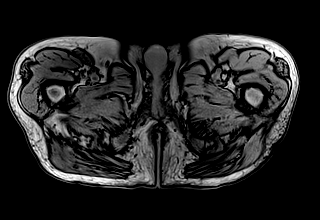
[im 88/88]
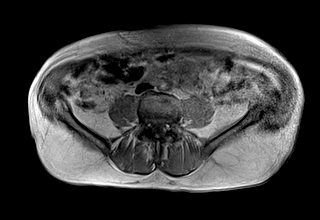

[Series 5: DWI · axial · 3.0mm · 1.75mm/px · z∈[-56,+10]mm · 2 of 69 slices shown (1 of 3)]
[im 1/69]
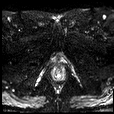
[im 69/69]
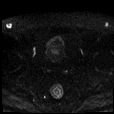

[Series 6: DWI · axial · 3.0mm · 1.75mm/px · 1 of 23 slices shown (2 of 3)]
[im 1/23]
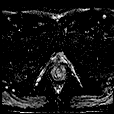

[Series 7: DWI · axial · 3.0mm · 1.75mm/px · 1 of 23 slices shown (3 of 3)]
[im 1/23]
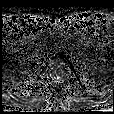

[Series 8: T2 · axial · 3.0mm · 0.56mm/px · 1 of 25 slices shown (2 of 3)]
[im 1/25]
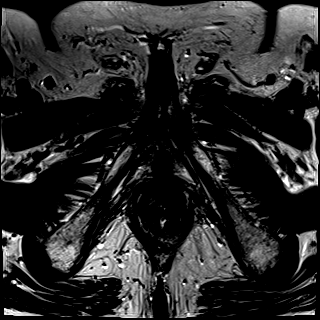

[Series 9: T2 · axial · 1.0mm · 1.04mm/px · z∈[-67,+12]mm · 2 of 80 slices shown (3 of 3)]
[im 1/80]
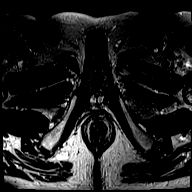
[im 80/80]
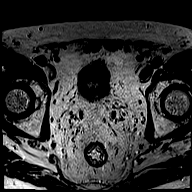

[Series 10: pre t1_twist_tra_dyn · axial · non-contrast · 3.5mm · 0.83mm/px · 1 of 20 slices shown]
[im 1/20]
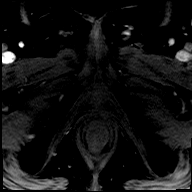

[Series 11: post t1_twist_tra_dyn-copy center · axial · non-contrast · 3.5mm · 0.83mm/px · z∈[-61,+6]mm · 17 of 600 slices shown]
[im 1/600]
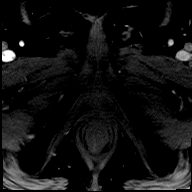
[im 38/600]
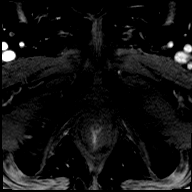
[im 75/600]
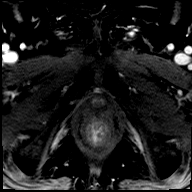
[im 113/600]
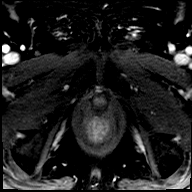
[im 150/600]
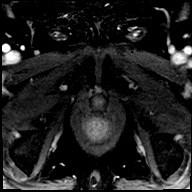
[im 188/600]
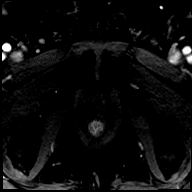
[im 225/600]
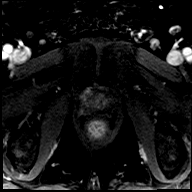
[im 263/600]
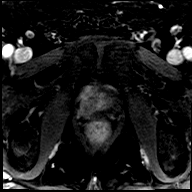
[im 300/600]
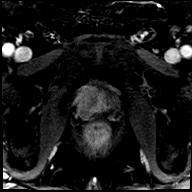
[im 337/600]
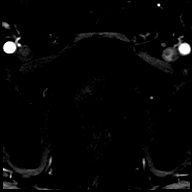
[im 375/600]
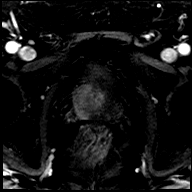
[im 412/600]
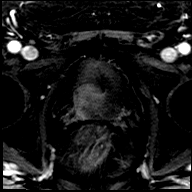
[im 450/600]
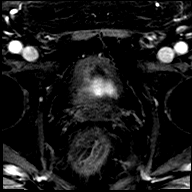
[im 487/600]
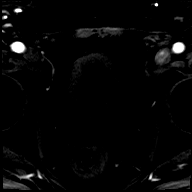
[im 525/600]
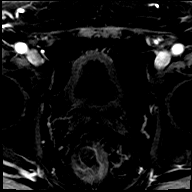
[im 562/600]
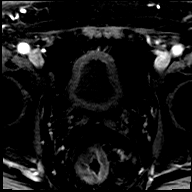
[im 600/600]
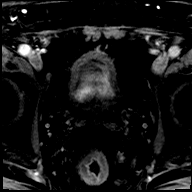

[Series 12: post t1_twist_tra_dyn-copy cent_sub · axial · 3.5mm · 0.83mm/px · z∈[-61,+6]mm · 16 of 580 slices shown]
[im 1/580]
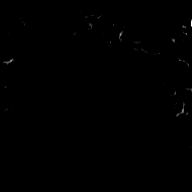
[im 39/580]
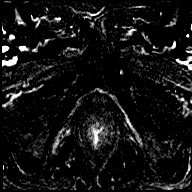
[im 78/580]
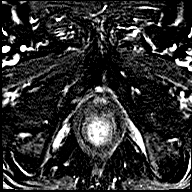
[im 116/580]
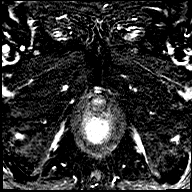
[im 155/580]
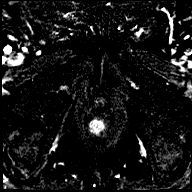
[im 194/580]
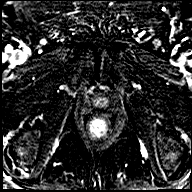
[im 232/580]
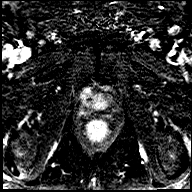
[im 271/580]
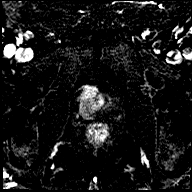
[im 309/580]
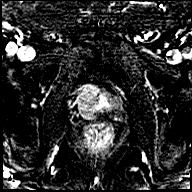
[im 348/580]
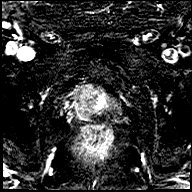
[im 387/580]
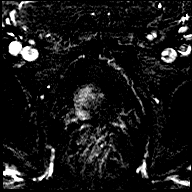
[im 425/580]
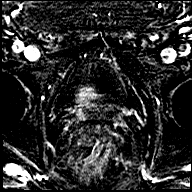
[im 464/580]
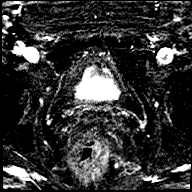
[im 502/580]
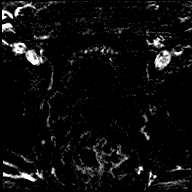
[im 541/580]
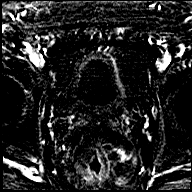
[im 580/580]
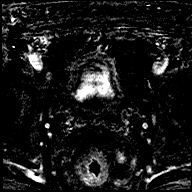

[Series 13: t1_vibe_dixon_tra_f · axial · 2.5mm · 0.91mm/px · z∈[-75,+123]mm · 2 of 80 slices shown]
[im 1/80]
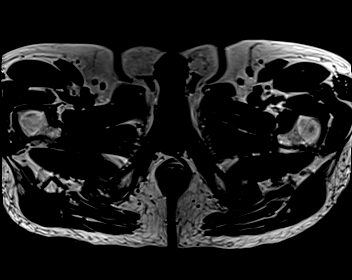
[im 80/80]
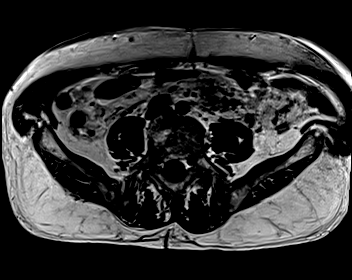

[Series 14: t1_vibe_dixon_tra_w · axial · 2.5mm · 0.91mm/px · z∈[-75,+123]mm · 2 of 80 slices shown]
[im 1/80]
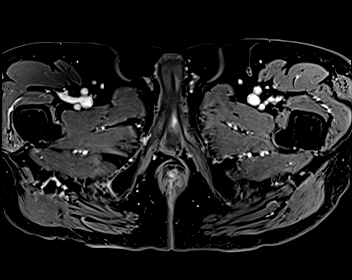
[im 80/80]
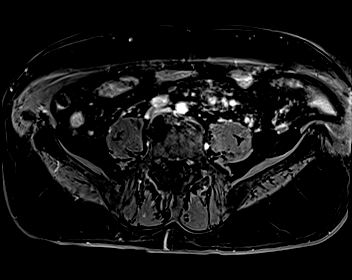

[48 of 48 positions shown; findings below may reference images not displayed]

FINDINGS: Prostate: 3.0 x 1.2 x 3.0 cm low T2 mass in the right lateral
peripheral zone, mid gland to base (series 8/images 11 and 14).
Associated early arterial enhancement (series 11/image 190).
Associated restricted diffusion/low ADC (series [DATE]/image 12). This
appearance is highly suspicious for high-grade macroscopic prostate
cancer (PI-RADS 5).

Volume: 4.1 x 3.4 x 2.9 cm (calculated volume 21.0 mL).

Transcapsular spread: Suspected gross extracapsular extension along
the posterolateral mid gland (series 9/image 45) as well as
anteriorly at the base (series 9/image 25).

Seminal vesicle involvement: Absent.

Neurovascular bundle involvement: Suspected on the right (series
9/image 45).

Pelvic adenopathy: Absent.

Bone metastasis: Absent.

Other findings: Mild rectal wall thickening, although symmetric and
favored to reflect underdistention.
IMPRESSION: 3.0 cm lesion in the right lateral peripheral zone, mid gland to
base, highly suspicious for high-grade macroscopic prostate cancer
(PI-RADS 5). This lesion was marked in DynaCAD 3D software for
potential UroNAV biopsy.

Suspected gross extracapsular extension and involvement of the right
neurovascular bundle.

No evidence of seminal vesicle invasion, lymphadenopathy, or
metastatic disease.

## 2021-01-22 MED ORDER — GADOBENATE DIMEGLUMINE 529 MG/ML IV SOLN
15.0000 mL | Freq: Once | INTRAVENOUS | Status: AC | PRN
  Administered 2021-01-22: 15 mL via INTRAVENOUS

## 2021-02-09 DIAGNOSIS — C61 Malignant neoplasm of prostate: Secondary | ICD-10-CM | POA: Diagnosis not present

## 2021-02-09 DIAGNOSIS — R972 Elevated prostate specific antigen [PSA]: Secondary | ICD-10-CM | POA: Diagnosis not present

## 2021-02-16 DIAGNOSIS — E042 Nontoxic multinodular goiter: Secondary | ICD-10-CM | POA: Diagnosis not present

## 2021-02-16 DIAGNOSIS — C61 Malignant neoplasm of prostate: Secondary | ICD-10-CM | POA: Diagnosis not present

## 2021-02-16 DIAGNOSIS — Z8659 Personal history of other mental and behavioral disorders: Secondary | ICD-10-CM | POA: Diagnosis not present

## 2021-02-16 DIAGNOSIS — K754 Autoimmune hepatitis: Secondary | ICD-10-CM | POA: Diagnosis not present

## 2021-02-16 DIAGNOSIS — E7800 Pure hypercholesterolemia, unspecified: Secondary | ICD-10-CM | POA: Diagnosis not present

## 2021-02-16 DIAGNOSIS — K449 Diaphragmatic hernia without obstruction or gangrene: Secondary | ICD-10-CM | POA: Diagnosis not present

## 2021-02-16 DIAGNOSIS — K766 Portal hypertension: Secondary | ICD-10-CM | POA: Diagnosis not present

## 2021-02-16 DIAGNOSIS — K8020 Calculus of gallbladder without cholecystitis without obstruction: Secondary | ICD-10-CM | POA: Diagnosis not present

## 2021-02-16 DIAGNOSIS — K219 Gastro-esophageal reflux disease without esophagitis: Secondary | ICD-10-CM | POA: Diagnosis not present

## 2021-02-16 DIAGNOSIS — K7460 Unspecified cirrhosis of liver: Secondary | ICD-10-CM | POA: Diagnosis not present

## 2021-03-02 ENCOUNTER — Other Ambulatory Visit: Payer: Self-pay

## 2021-03-02 DIAGNOSIS — K754 Autoimmune hepatitis: Secondary | ICD-10-CM

## 2021-03-02 DIAGNOSIS — R932 Abnormal findings on diagnostic imaging of liver and biliary tract: Secondary | ICD-10-CM

## 2021-03-02 DIAGNOSIS — K7460 Unspecified cirrhosis of liver: Secondary | ICD-10-CM

## 2021-03-02 NOTE — Progress Notes (Signed)
Patient due for RUQ U/S. Scheduled at  [facility] on 3-16 at 10:00am to arr at 9:45am. NPO after midnight. Sent MyChart message to patient.

## 2021-03-03 ENCOUNTER — Encounter: Payer: Self-pay | Admitting: Medical Oncology

## 2021-03-03 NOTE — Progress Notes (Signed)
I called pt to introduce myself as the Prostate Nurse Navigator and the Coordinator of the Prostate [place].  1. I confirmed with the patient he is aware of his referral to the clinic 3/11, arriving @ 8 am.   2. I discussed the format of the clinic and the physicians he will be seeing that day.  3. I discussed where the clinic is located and how to contact me.  4. I confirmed his address and informed him I would be mailing a packet of information and forms to be completed. I asked him to bring them with him the day of his appointment.   He voiced understanding of the above. I asked him to call me if he has any questions or concerns regarding his appointments or the forms he needs to complete.

## 2021-03-08 ENCOUNTER — Other Ambulatory Visit: Payer: Self-pay | Admitting: Nurse Practitioner

## 2021-03-08 ENCOUNTER — Other Ambulatory Visit (HOSPITAL_COMMUNITY): Payer: Self-pay | Admitting: Urology

## 2021-03-08 DIAGNOSIS — K7469 Other cirrhosis of liver: Secondary | ICD-10-CM

## 2021-03-08 DIAGNOSIS — Z01818 Encounter for other preprocedural examination: Secondary | ICD-10-CM | POA: Diagnosis not present

## 2021-03-08 DIAGNOSIS — C61 Malignant neoplasm of prostate: Secondary | ICD-10-CM

## 2021-03-08 DIAGNOSIS — K754 Autoimmune hepatitis: Secondary | ICD-10-CM | POA: Diagnosis not present

## 2021-03-08 NOTE — Progress Notes (Signed)
GU Location of Tumor / Histology: prostatic adenocarcinoma  If Prostate Cancer, Gleason Score is (4 + 4) and PSA is (9.43). Prostate volume: 23.19 grams  Jeffrey Hanson was found to have an elevated PSA of 4.0 in April 2019. PCP followed patient with serial PSA.  Biopsies of prostate (if applicable) revealed:   Past/Anticipated interventions by urology, if any: prostate biopsy, PET scan order and scheduled for 03/20/21, patient referred to [place]  Past/Anticipated interventions by medical oncology, if any: no  Weight changes, if any: denies  Bowel/Bladder complaints, if any: IPSS 1. SHIM 5. Denies dysuria, hematuria, urinary leakage or incontinence.   Nausea/Vomiting, if any: denies  Pain issues, if any:       denies  SAFETY ISSUES:  Prior radiation? denies  Pacemaker/ICD? Denies         Possible current pregnancy? no, male patient  Is the patient on methotrexate? no  Current Complaints / other details:  65 year old male. Married with 2 daughters and 2 sons.  Patient denies a family hx of prostate cancer. Lung cancer - mother.

## 2021-03-09 ENCOUNTER — Encounter: Payer: Self-pay | Admitting: Medical Oncology

## 2021-03-09 NOTE — Progress Notes (Signed)
Spoke with patient to confirm appointment for [facility] 3/11 arriving @ 8 am. I reviewed [place] parking, registration and reminded him to bring his complete medical forms.

## 2021-03-10 ENCOUNTER — Encounter: Payer: Self-pay | Admitting: General Practice

## 2021-03-10 ENCOUNTER — Inpatient Hospital Stay: Payer: Medicare PPO | Attending: Oncology | Admitting: Oncology

## 2021-03-10 ENCOUNTER — Encounter: Payer: Self-pay | Admitting: Radiation Oncology

## 2021-03-10 ENCOUNTER — Encounter: Payer: Self-pay | Admitting: Medical Oncology

## 2021-03-10 ENCOUNTER — Other Ambulatory Visit: Payer: Self-pay

## 2021-03-10 ENCOUNTER — Ambulatory Visit
Admission: RE | Admit: 2021-03-10 | Discharge: 2021-03-10 | Disposition: A | Discharge status: Home / Self Care | Payer: Medicare PPO | Source: Ambulatory Visit | Attending: Radiation Oncology | Admitting: Radiation Oncology

## 2021-03-10 VITALS — BP 131/91 | HR 61 | Temp 96.9°F | Resp 18 | Ht 71.0 in | Wt 174.8 lb

## 2021-03-10 DIAGNOSIS — K760 Fatty (change of) liver, not elsewhere classified: Secondary | ICD-10-CM | POA: Insufficient documentation

## 2021-03-10 DIAGNOSIS — Z8711 Personal history of peptic ulcer disease: Secondary | ICD-10-CM | POA: Insufficient documentation

## 2021-03-10 DIAGNOSIS — Z79899 Other long term (current) drug therapy: Secondary | ICD-10-CM | POA: Diagnosis not present

## 2021-03-10 DIAGNOSIS — Z8249 Family history of ischemic heart disease and other diseases of the circulatory system: Secondary | ICD-10-CM

## 2021-03-10 DIAGNOSIS — K7460 Unspecified cirrhosis of liver: Secondary | ICD-10-CM | POA: Diagnosis not present

## 2021-03-10 DIAGNOSIS — K219 Gastro-esophageal reflux disease without esophagitis: Secondary | ICD-10-CM | POA: Insufficient documentation

## 2021-03-10 DIAGNOSIS — K449 Diaphragmatic hernia without obstruction or gangrene: Secondary | ICD-10-CM | POA: Diagnosis not present

## 2021-03-10 DIAGNOSIS — F17210 Nicotine dependence, cigarettes, uncomplicated: Secondary | ICD-10-CM | POA: Insufficient documentation

## 2021-03-10 DIAGNOSIS — C61 Malignant neoplasm of prostate: Secondary | ICD-10-CM | POA: Diagnosis not present

## 2021-03-10 DIAGNOSIS — E785 Hyperlipidemia, unspecified: Secondary | ICD-10-CM | POA: Insufficient documentation

## 2021-03-10 DIAGNOSIS — Z8659 Personal history of other mental and behavioral disorders: Secondary | ICD-10-CM | POA: Insufficient documentation

## 2021-03-10 DIAGNOSIS — I700 Atherosclerosis of aorta: Secondary | ICD-10-CM | POA: Insufficient documentation

## 2021-03-10 DIAGNOSIS — E042 Nontoxic multinodular goiter: Secondary | ICD-10-CM | POA: Insufficient documentation

## 2021-03-10 DIAGNOSIS — K754 Autoimmune hepatitis: Secondary | ICD-10-CM | POA: Insufficient documentation

## 2021-03-10 DIAGNOSIS — K766 Portal hypertension: Secondary | ICD-10-CM | POA: Insufficient documentation

## 2021-03-10 DIAGNOSIS — Z7952 Long term (current) use of systemic steroids: Secondary | ICD-10-CM | POA: Diagnosis not present

## 2021-03-10 DIAGNOSIS — Z800 Family history of malignant neoplasm of digestive organs: Secondary | ICD-10-CM | POA: Insufficient documentation

## 2021-03-10 DIAGNOSIS — G479 Sleep disorder, unspecified: Secondary | ICD-10-CM | POA: Insufficient documentation

## 2021-03-10 DIAGNOSIS — Z86010 Personal history of colonic polyps: Secondary | ICD-10-CM | POA: Insufficient documentation

## 2021-03-10 DIAGNOSIS — R972 Elevated prostate specific antigen [PSA]: Secondary | ICD-10-CM | POA: Diagnosis not present

## 2021-03-10 HISTORY — DX: Malignant neoplasm of prostate: C61

## 2021-03-10 NOTE — Consult Note (Signed)
[facility]     03/10/2021   --------------------------------------------------------------------------------   Jeffrey Hanson  MRN: [id number]  DOB: 08/10/1956, 65 year old Male  SSN: [id number]   PRIMARY CARE:  Modena Jansky. Marisue Humble, MD  REFERRING:  Modena Jansky. Marisue Humble, MD  PROVIDER:  Louis Meckel, M.D.  TREATING:  Raynelle Bring, M.D.  LOCATION:  [facility] [phone]     --------------------------------------------------------------------------------   CC/HPI: CC: Prostate Cancer   Physician requesting consult: Dr. Burman Nieves  PCP: Dr. Gaynelle Arabian  Location of consult: [facility]   Jeffrey Hanson is a 65 year old gentleman with a past medical history significant for anxiety, depression, GERD, hyperlipidemia, and portal hypertension and autoimmune hepatitis. He is followed by hepatology and apparently had been told last year that he would be high risk for general anesthesia due to concern about his ability to metabolize certain anesthetic drugs. He initially presented to Dr. Louis Meckel in 2019 with an elevated PSA of 4.0. He was supposed to return for a repeat PSA but did not follow up. His PSA was subsequently checked by his PCP and it progressively increased to 9.11 prompting another referral in December 2021. At that time, he was noted to have a concerning nodule on the right side of the prostate. He underwent an MRI of the prostate on 01/22/21 that demonstrated a 3.0 cm PI-RADS 5 lesion on the right side of the prostate with concern for EPE. An MR/US fusion biopsy of the prostate was performed on 02/09/21 and confirmed Gleason 4+4=8 adenocarcinoma with 7 out of 15 cores positive including 3 out of 3 targeted biopsy cores.   Family history:   Imaging studies: PSMA PENDING   PMH: He has a history of anxiety, depression, GERD, hyperlipidemia, portal hypertension, and autoimmune hepatitis as noted  above. He was diagnosed with esophageal varices and gallstones and underwent a liver biopsy confirming cirrhotic change with autoimmune hepatitis. He was treated with prednisone for 3 months and is now on propanolol for his varices and azathioprine for his autoimmune hepatitis. He is followed by Roosevelt Locks, NP with Hepatology and Dr. Havery Moros in GI. His perioperative risk has been assessed by Hepatology and he felt to be at low risk for perioperative morbidity/mortality related to his liver disease with a 0.4% 30 day mortality and 1.0% risk of 90 day mortality.  PSH: History of testicular torsion.   TNM stage: cT3a N0 Mx  PSA: 9.11  Gleason score: 4+4=8 (GG 4)  Biopsy (02/09/21): 7/15 cores positive  Left: Benign  Right: R lateral apex (80%, 4+4=8), R lateral mid (80%, 4+3=7, PNI), R base (80%, 4+3=7), R lateral base (60%, 4+3=7, PNI)  Targeted: 3/3 cores (4+3=7, 95%, 95%, 90%)  Prostate volume: 23.2 cc   Nomogram  OC disease: 14%  EPE: 86%  SVI: 22%  LNI: 27%  PFS (5 year, 10 year): 27%, 16%   Urinary function: IPSS is 1.  Erectile function: SHIM score is 5. This is mostly related to his low libido. He has been evaluated for this and his testosterone levels were normal. He has also seen a psychiatrist for this along with his depression.     ALLERGIES: Atorvastatin Escitalopram Keflex Meloxicam Oxycodone Percocet Pravastatin Sulfa Sulfur Vicodin    MEDICATIONS: Nexium  Valium 10 mg tablet 2 tablet PO once take one hour prior to procedure  Valium 10 mg tablet 1-2 tablet PO once take one hour prior to procedure  Zyrtec  10 mg capsule  Alprazolam 0.5 mg tablet  Azathioprine  Propranolol Hcl 20 mg tablet     Notes: CBD oil under tongue   GU PSH: Prostate Needle Biopsy - 02/09/2021       PSH Notes: Rotators Cuff surgery on L shoulder  Torsion Testicle  Endoscopy   NON-GU PSH: Surgical Pathology, Gross And Microscopic Examination For Prostate Needle - 02/09/2021      GU PMH: Prostate Cancer - 02/16/2021 Elevated PSA - 02/09/2021, - 12/20/2020, - 2019 Prostate nodule w/o LUTS - 02/09/2021, - 12/20/2020      PMH Notes: multinodular goiter, autoimmune hepatitis, hiatal hernia, gastric polyp   NON-GU PMH: Anxiety Arthritis Autoimmune hepatitis Depression Encounter for general adult medical examination without abnormal findings, Encounter for preventive health examination GERD Hypercholesterolemia Portal hypertension    FAMILY HISTORY: 1 Daughter - No Family History 1 son - No Family History Lung Cancer - Mother   SOCIAL HISTORY: Marital Status: Married Preferred Language: English; Race: White Current Smoking Status: Patient smokes occasionally.   Tobacco Use Assessment Completed: Used Tobacco in last 30 days? Drinks 10 drinks per week. Types of alcohol consumed: Beer, Liquor, Wine.  Drinks 1 caffeinated drink per day. Has not had a blood transfusion.    REVIEW OF SYSTEMS:    GU Review Male:   Patient denies frequent urination, hard to postpone urination, burning/ pain with urination, get up at night to urinate, leakage of urine, stream starts and stops, trouble starting your streams, and have to strain to urinate .  Gastrointestinal (Upper):   Patient denies nausea and vomiting.  Gastrointestinal (Lower):   Patient denies diarrhea and constipation.  Constitutional:   Patient denies fever, night sweats, weight loss, and fatigue.  Skin:   Patient denies skin rash/ lesion and itching.  Eyes:   Patient denies blurred vision and double vision.  Ears/ Nose/ Throat:   Patient denies sore throat and sinus problems.  Hematologic/Lymphatic:   Patient denies swollen glands and easy bruising.  Cardiovascular:   Patient denies leg swelling and chest pains.  Respiratory:   Patient denies cough and shortness of breath.  Endocrine:   Patient denies excessive thirst.  Musculoskeletal:   Patient denies back pain and joint pain.  Neurological:   Patient  denies headaches and dizziness.  Psychologic:   Patient denies depression and anxiety.   VITAL SIGNS: None   MULTI-SYSTEM PHYSICAL EXAMINATION:    Constitutional: Well-nourished. No physical deformities. Normally developed. Good grooming.     Complexity of Data:  Lab Test Review:   PSA  Records Review:   Previous Patient Records  X-Ray Review: MRI Prostate GSORAD: Reviewed Films.     12/20/20 07/29/18  PSA  Total PSA 9.43 ng/mL 4.0 ng/dl    PROCEDURES: None   ASSESSMENT:      ICD-10 Details  1 GU:   Prostate Cancer - C61    PLAN:           Document Letter(s):  Created for Patient: Clinical Summary         Notes:   1. Prostate cancer: I had a long discussion with Mr. Mcnee and his wife today. They have talked with Dr. Tammi Klippel and Dr. Alen Blew. The patient was counseled about the natural history of prostate cancer and the standard treatment options that are available for prostate cancer. It was explained to him how his age and life expectancy, clinical stage, Gleason score, and PSA affect his prognosis, the decision to proceed with additional staging studies,  as well as how that information influences recommended treatment strategies. We discussed the roles for active surveillance, radiation therapy, surgical therapy, androgen deprivation, as well as ablative therapy options for the treatment of prostate cancer as appropriate to his individual cancer situation. We discussed the risks and benefits of these options with regard to their impact on cancer control and also in terms of potential adverse events, complications, and impact on quality of life particularly related to urinary and sexual function. The patient was encouraged to ask questions throughout the discussion today and all questions were answered to his stated satisfaction. In addition, the patient was provided with and/or directed to appropriate resources and literature for further education about prostate cancer and treatment  options.   We discussed surgical therapy for prostate cancer including the different available surgical approaches. We discussed, in detail, the risks and expectations of surgery with regard to cancer control, urinary control, and erectile function as well as the expected postoperative recovery process. Additional risks of surgery including but not limited to bleeding, infection, hernia formation, nerve damage, lymphocele formation, bowel/rectal injury potentially necessitating colostomy, damage to the urinary tract resulting in urine leakage, urethral stricture, and the cardiopulmonary risks such as myocardial infarction, stroke, death, venothromboembolism, etc. were explained. The risk of open surgical conversion for robotic/laparoscopic prostatectomy was also discussed.   He understands the importance of proper staging with his upcoming PSMA PET scan and understands that further treatment decisions may depend on those results. However, if negative for metastatic disease, he would be an appropriate candidate for primary surgical therapy possible with the need for subsequent adjuvant/salvage therapy vs primary EBRT and long term ADT. He will continue to consider his options and await his PSMA PET results. I have told him he is in excellent hands with Dr. Louis Meckel if he elects surgery but that I am obviously happy to help him in anyway.   CC: Dr. Burman Nieves  Dr. Gaynelle Arabian  Dr. Tyler Pita  Dr. Zola Button          E & M CODES: We spent 68 minutes dedicated to evaluation and management time, including face to face interaction, discussions on coordination of care, documentation, result review, and discussion with others as applicable.

## 2021-03-10 NOTE — Progress Notes (Signed)
                               Care Plan Summary  Name: Mr. Matt Delpizzo  DOB: 06-Dec-1956  Your Medical Team:   Urologist -  Dr. Raynelle Bring, [facility]  Radiation Oncologist - Dr. Tyler Pita, [facility]   Medical Oncologist - Dr. Zola Button, [place]  Recommendations: 1)  PET scan   * These recommendations are based on information available as of today's consult.      Recommendations may change depending on the results of further tests or exams.    Next Steps:  1) Consider your options and call Cira Rue, RN with decision    When appointments need to be scheduled, you will be contacted by [facility] and/or [facility].  Questions?  Please do not hesitate to call Cira Rue, RN, BSN, OCN at [phone] any questions or concerns.  Shirlean Mylar is your Oncology Nurse Navigator and is available to assist you while you're receiving your medical care at [facility].

## 2021-03-10 NOTE — Progress Notes (Signed)
Reason for the request: Prostate cancer  HPI: I was asked by Dr. Louis Meckel to evaluate Jeffrey Hanson for diagnosis of prostate cancer.  He is a 65 year old man with autoimmune hepatitis and cirrhosis of the liver is well compensated with a follow-up elevated PSA in 2019.  At that time his PSA was 4.0 and subsequently increased up to 9.43 in December 2021.  He underwent MRI in January 2022 and showed PI-RADS 5 lesion posterior lateral mid gland.  A biopsy done on February 09, 2021 showed.  Gleason score 4+4 = 8 and 1 grade with Gleason score 4+3 equal 7 from 3 other cores.  Initial imaging studies with MRI did not show any evidence of metastatic disease.  PET scan is scheduled for the near future.  Clinically, he reports no complaints at this time.  He  does not report any headaches, blurry vision, syncope or seizures. Does not report any fevers, chills or sweats.  Does not report any cough, wheezing or hemoptysis.  Does not report any chest pain, palpitation, orthopnea or leg edema.  Does not report any nausea, vomiting or abdominal pain.  Does not report any constipation or diarrhea.  Does not report any skeletal complaints.    Does not report frequency, urgency or hematuria.  Does not report any skin rashes or lesions. Does not report any heat or cold intolerance.  Does not report any lymphadenopathy or petechiae.  Does not report any anxiety or depression.  Remaining review of systems is negative.    Past Medical History:  Diagnosis Date  . Autoimmune hepatitis ([place])   . Cirrhosis ([place])   . Colon polyps   . Depression    situational  . Gallstones   . Gastric polyps   . GERD (gastroesophageal reflux disease)   . History of hiatal hernia   . Hyperlipemia   . Pericarditis   . Pneumonia   . Wears glasses   :  Past Surgical History:  Procedure Laterality Date  . COLONOSCOPY  12/21/2015  . ESOPHAGOGASTRODUODENOSCOPY (EGD) WITH PROPOFOL N/A 08/22/2020   Procedure: ESOPHAGOGASTRODUODENOSCOPY (EGD)  WITH PROPOFOL;  Surgeon: Rush Landmark Telford Nab., MD;  Location: [place];  Service: Gastroenterology;  Laterality: N/A;  . HEMOSTASIS CLIP PLACEMENT  08/22/2020   Procedure: HEMOSTASIS CLIP PLACEMENT;  Surgeon: Irving Copas., MD;  Location: [place];  Service: Gastroenterology;;  . HEMOSTASIS CONTROL  08/22/2020   Procedure: HEMOSTASIS CONTROL;  Surgeon: Irving Copas., MD;  Location: [place];  Service: Gastroenterology;;  . IR TRANSCATHETER BX  06/13/2020  . MOUTH SURGERY    . POLYPECTOMY    . POLYPECTOMY  08/22/2020   Procedure: POLYPECTOMY;  Surgeon: Mansouraty, Telford Nab., MD;  Location: [place];  Service: Gastroenterology;;  . SHOULDER ARTHROSCOPY     left, bone spur  . SUBMUCOSAL INJECTION  08/22/2020   Procedure: SUBMUCOSAL INJECTION;  Surgeon: Rush Landmark Telford Nab., MD;  Location: [place];  Service: Gastroenterology;;  . TONSILLECTOMY AND ADENOIDECTOMY    . UPPER ESOPHAGEAL ENDOSCOPIC ULTRASOUND (EUS)  08/22/2020   Procedure: UPPER ESOPHAGEAL ENDOSCOPIC ULTRASOUND (EUS);  Surgeon: Irving Copas., MD;  Location: [place];  Service: Gastroenterology;;  . Michelene Gardener    . wisdom teeth extractions    :   Current Outpatient Medications:  .  ALPRAZolam (XANAX) 0.5 MG tablet, Take 0.5 mg by mouth at bedtime as needed for anxiety or sleep. , Disp: , Rfl:  .  esomeprazole (NEXIUM) 40 MG capsule, Take 1 capsule (40 mg total) by mouth 2 (two)  times daily before a meal., Disp: 60 capsule, Rfl: 3 .  predniSONE (DELTASONE) 10 MG tablet, Take 10 mg by mouth 3 (three) times daily., Disp: , Rfl:  .  propranolol (INDERAL) 20 MG tablet, Take 1 tablet (20 mg total) by mouth 2 (two) times daily. (Patient taking differently: Take 20 mg by mouth daily. ), Disp: 60 tablet, Rfl: 11:  Allergies  Allergen Reactions  . Sulfa Antibiotics Rash  :  Family History  Problem Relation Age of Onset  . Colon cancer Paternal Grandfather        mid 42's  .  Heart disease Father   . Colon polyps Neg Hx   . Rectal cancer Neg Hx   . Stomach cancer Neg Hx   . Esophageal cancer Neg Hx   . Inflammatory bowel disease Neg Hx   . Liver disease Neg Hx   . Pancreatic cancer Neg Hx   :  Social History   Socioeconomic History  . Marital status: Married    Spouse name: Not on file  . Number of children: Not on file  . Years of education: Not on file  . Highest education level: Not on file  Occupational History  . Not on file  Tobacco Use  . Smoking status: Current Some Day Smoker    Types: Cigars  . Smokeless tobacco: Never Used  Vaping Use  . Vaping Use: Never used  Substance and Sexual Activity  . Alcohol use: Not Currently    Alcohol/week: 15.0 standard drinks    Types: 8 Cans of beer, 7 Shots of liquor per week  . Drug use: No  . Sexual activity: Not on file  Other Topics Concern  . Not on file  Social History Narrative  . Not on file   Social Determinants of Health   Financial Resource Strain: Not on file  Food Insecurity: Not on file  Transportation Needs: Not on file  Physical Activity: Not on file  Stress: Not on file  Social Connections: Not on file  Intimate Partner Violence: Not on file  :  Pertinent items are noted in HPI.  Exam: ECOG 0 General appearance: alert and cooperative appeared without distress.   Assessment and Plan:   65 year old without prostate cancer diagnosed in February 2022.  His PSA is 9.43 with a Gleason score 4+4 = 8.  PET scan is currently pending to complete his staging.  This case was discussed today in the prostate cancer multidisciplinary clinic including review of his MRI with radiology and his biopsy results with the reviewing pathologist.  Treatment options were reviewed today in detail.  He has a high risk prostate cancer given his Gleason score and would be eligible for surgery or radiation.  Both treatment options were discussed today in detail.  Risk of anesthesia given his liver  disease was discussed but felt that he would be a reasonable candidate to receive anesthesia by gastroenterology.  We discussed the oncological outcome associated with both treatments as well as complications associated with both approaches.  We also discussed in detail the role of long-term androgen deprivation therapy in the setting of radiation.  Complication associated with this treatment clinic plicating hot/is sexual dysfunction among others were reviewed.  We also discussed the role of salvage radiation after surgery if he opts to proceed with that route.  Given the fact that he is cleared by hepatology and gastroenterology to receive anesthesia and he has both options open for him. He will determine that after his  visit today regarding which way he chooses to go.  All his questions were answered today to his satisfaction.  45  minutes were dedicated to this visit. The time was spent on reviewing laboratory data, imaging studies, discussing treatment options, and answering questions regarding future plan.     A copy of this consult has been forwarded to the requesting physician.

## 2021-03-10 NOTE — Progress Notes (Signed)
Radiation Oncology         [phone] ________________________________  Multidisciplinary Prostate Cancer Clinic  Initial Radiation Oncology Consultation  Name: Jeffrey Hanson MRN: [id number]  Date: 03/10/2021  DOB: 03/26/1956  CC:Jeffrey Arabian, MD  Jeffrey Hughs, MD   REFERRING PHYSICIAN: Ardis Hughs, MD  DIAGNOSIS: 65 y.o. gentleman with stage T2b adenocarcinoma of the prostate with a Gleason's score of 4+4 and a PSA of 9.43    ICD-10-CM   1. Malignant neoplasm of prostate ([place])  C61     HISTORY OF PRESENT ILLNESS::Jeffrey Hanson is a 65 y.o. gentleman. He has a history of elevated PSA, initially referred to Dr. Louis Hanson in 03/2018 after his PCP noted that his PSA was elevated at 5.27 in January 2019 and remained elevated at 4.0 on repeat in February 2019.  At the time of that visit, his digital rectal exam was without any concerning findings and PSA remained stable at 4.0 on 07/29/2018.  He elected to have his PSA monitored with his PCP, Dr. Marisue Hanson and has continued with minimal LUTS. His PSA has gradually risen over time as below: 12/2018 - 4.75 08/2019 - 4.69 02/2020 - 5.7 07/2020 - 9.11  Accordingly, he was referred back to Dr. Louis Hanson on 12/20/2020,  digital rectal examination was performed at that time revealing a 10 mm right mid gland prostate nodule. Repeat PSA from that day was 9.43. He underwent prostate MRI on 01/22/2021 showing a 3.0 cm PI-RADS 5 lesion in right lateral peripheral zone, mid gland to base with suspected gross extracapsular extension and involvement of the right neurovascular bundle.  There was no evidence of seminal vesicle invasion, lymphadenopathy, or metastatic disease.     The patient proceeded to MRI fusion biopsy with 15 biopsies of the prostate on 02/09/2021.  The prostate volume measured 23.19 cc.  Out of 15 core biopsies, 7 were positive.  The maximum Gleason score was 4+4, and this was seen in the right apex lateral. Additionally,  Gleason 4+3 was seen in all three ROI MRI lesion samples as well as the right mid lateral (with PNI), right base lateral (with PNI), and right base.    He is scheduled for PSMA scan on 03/20/2021 for further disease staging.  Of note, he has a history of autoimmune hepatitis and had previously been told that he was at an increased anesthesia risk but has recently met with his hepatologist and is currently felt to be at low risk.  The patient reviewed the biopsy results with his urologist and he has kindly been referred today to the multidisciplinary prostate cancer clinic for presentation of pathology and radiology studies in our conference for discussion of potential radiation treatment options and clinical evaluation.  PREVIOUS RADIATION THERAPY: No  PAST MEDICAL HISTORY:  has a past medical history of Autoimmune hepatitis ([place]), Cirrhosis ([place]), Colon polyps, Depression, Gallstones, Gastric polyps, GERD (gastroesophageal reflux disease), History of hiatal hernia, Hyperlipemia, Pericarditis, Pneumonia, and Wears glasses.    PAST SURGICAL HISTORY: Past Surgical History:  Procedure Laterality Date  . COLONOSCOPY  12/21/2015  . ESOPHAGOGASTRODUODENOSCOPY (EGD) WITH PROPOFOL N/A 08/22/2020   Procedure: ESOPHAGOGASTRODUODENOSCOPY (EGD) WITH PROPOFOL;  Surgeon: Rush Landmark Telford Nab., MD;  Location: [facility];  Service: Gastroenterology;  Laterality: N/A;  . HEMOSTASIS CLIP PLACEMENT  08/22/2020   Procedure: HEMOSTASIS CLIP PLACEMENT;  Surgeon: Irving Copas., MD;  Location: [facility];  Service: Gastroenterology;;  . HEMOSTASIS CONTROL  08/22/2020   Procedure: HEMOSTASIS CONTROL;  Surgeon: Irving Copas.,  MD;  Location: [place];  Service: Gastroenterology;;  . IR TRANSCATHETER BX  06/13/2020  . MOUTH SURGERY    . POLYPECTOMY    . POLYPECTOMY  08/22/2020   Procedure: POLYPECTOMY;  Surgeon: Mansouraty, Telford Nab., MD;  Location: [facility];  Service:  Gastroenterology;;  . SHOULDER ARTHROSCOPY     left, bone spur  . SUBMUCOSAL INJECTION  08/22/2020   Procedure: SUBMUCOSAL INJECTION;  Surgeon: Rush Landmark Telford Nab., MD;  Location: [facility];  Service: Gastroenterology;;  . TONSILLECTOMY AND ADENOIDECTOMY    . UPPER ESOPHAGEAL ENDOSCOPIC ULTRASOUND (EUS)  08/22/2020   Procedure: UPPER ESOPHAGEAL ENDOSCOPIC ULTRASOUND (EUS);  Surgeon: Irving Copas., MD;  Location: [facility];  Service: Gastroenterology;;  . Michelene Gardener    . wisdom teeth extractions      FAMILY HISTORY: family history includes Colon cancer in his paternal grandfather; Heart disease in his father.  SOCIAL HISTORY:  reports that he has been smoking cigars. He has never used smokeless tobacco. He reports previous alcohol use of about 15.0 standard drinks of alcohol per week. He reports that he does not use drugs.  ALLERGIES: Sulfa antibiotics  MEDICATIONS:  Current Outpatient Medications  Medication Sig Dispense Refill  . ALPRAZolam (XANAX) 0.5 MG tablet Take 0.5 mg by mouth at bedtime as needed for anxiety or sleep.     Marland Kitchen esomeprazole (NEXIUM) 40 MG capsule Take 1 capsule (40 mg total) by mouth 2 (two) times daily before a meal. 60 capsule 3  . predniSONE (DELTASONE) 10 MG tablet Take 10 mg by mouth 3 (three) times daily.    . propranolol (INDERAL) 20 MG tablet Take 1 tablet (20 mg total) by mouth 2 (two) times daily. (Patient taking differently: Take 20 mg by mouth daily. ) 60 tablet 11   No current facility-administered medications for this encounter.    REVIEW OF SYSTEMS:  On review of systems, the patient reports that he is doing well overall. He denies any chest pain, shortness of breath, cough, fevers, chills, night sweats, unintended weight changes. He denies any bowel disturbances, and denies abdominal pain, nausea or vomiting. He denies any new musculoskeletal or joint aches or pains. His IPSS was 1, indicating minimal urinary symptoms. His SHIM was 5,  indicating he has severe erectile dysfunction. A complete review of systems is obtained and is otherwise negative.   PHYSICAL EXAM:  Wt Readings from Last 3 Encounters:  08/22/20 165 lb 5.5 oz (75 kg)  08/17/20 165 lb 6.4 oz (75 kg)  05/11/20 202 lb (91.6 kg)   Temp Readings from Last 3 Encounters:  08/22/20 98 F (36.7 C) (Oral)  06/13/20 98 F (36.7 C) (Oral)  06/13/20 (!) 97.4 F (36.3 C) (Oral)   BP Readings from Last 3 Encounters:  08/22/20 105/70  08/17/20 130/80  06/13/20 132/83   Pulse Readings from Last 3 Encounters:  08/22/20 (!) 49  08/17/20 (!) 56  06/13/20 100    /10  In general this is a well appearing Caucasian male in no acute distress. He's alert and oriented x4 and appropriate throughout the examination. Cardiopulmonary assessment is negative for acute distress and he exhibits normal effort.    KPS = 90  100 - Normal; no complaints; no evidence of disease. 90   - Able to carry on normal activity; minor signs or symptoms of disease. 80   - Normal activity with effort; some signs or symptoms of disease. 74   - Cares for self; unable to carry on normal activity or  to do active work. 60   - Requires occasional assistance, but is able to care for most of his personal needs. 50   - Requires considerable assistance and frequent medical care. 71   - Disabled; requires special care and assistance. 68   - Severely disabled; hospital admission is indicated although death not imminent. 24   - Very sick; hospital admission necessary; active supportive treatment necessary. 10   - Moribund; fatal processes progressing rapidly. 0     - Dead  Karnofsky DA, Abelmann [place], Craver LS and Burchenal [facility] [phone]) The use of the nitrogen mustards in the palliative treatment of carcinoma: with particular reference to bronchogenic carcinoma Cancer 1 634-56   LABORATORY DATA:  Lab Results  Component Value Date   WBC 6.4 06/13/2020   HGB 16.4 06/13/2020   HCT 49.6 06/13/2020    MCV 95.8 06/13/2020   PLT 248 06/13/2020   Lab Results  Component Value Date   NA 136 05/24/2020   K 3.8 05/24/2020   CL 105 05/24/2020   CO2 23 05/24/2020   Lab Results  Component Value Date   ALT 26 08/17/2020   AST 30 08/17/2020   ALKPHOS 128 (H) 08/17/2020   BILITOT 1.4 (H) 08/17/2020     RADIOGRAPHY: No results found.    IMPRESSION/PLAN: 65 y.o. gentleman with Stage T2b adenocarcinoma of the prostate with a Gleason score of 4+4 and a PSA of 9.43.    We discussed the patient's workup and outlined the nature of prostate cancer in this setting. The patient's T stage, Gleason's score, and PSA put him into the high risk group.  Pending there are no unexpected findings to suggest distant metastatic disease on his upcoming PSA may scan, he is eligible for a variety of potential treatment options including prostatectomy or LT-ADT in combination with either 8 weeks of external radiation or 5 weeks of external radiation with an upfront brachytherapy boost. We discussed the available radiation techniques, and focused on the details and logistics of delivery. We discussed and outlined the risks, benefits, short and long-term effects associated with radiotherapy and compared and contrasted these with prostatectomy. We discussed the role of SpaceOAR gel in reducing the rectal toxicity associated with radiotherapy. We also detailed the role of ADT in the treatment of high risk prostate cancer and outlined the associated side effects that could be expected with this therapy. He appears to have a good understanding of his disease and our treatment recommendations which are of curative intent.  He and his wife were encouraged to ask questions that were answered to their stated satisfaction.  At the end of the conversation, the patient remains undecided but seems to be leaning towards LT-ADT with brachytherapy boost and 5 weeks of EBRT pending the upcoming PSA may scan confirms his disease to remain  localized.  He has already talked to his hepatologist and was told he is low risk for surgery. We will share our discussion with Dr. Louis Hanson and look forward to hearing back from the patient in the near future, following his PSA may scan, regarding his final treatment decision. We enjoyed meeting him today and look forward to continuing to participate in his care.  He knows that he is welcome to call at anytime with any questions or concerns related to radiotherapy.     Nicholos Johns, PA-C    Tyler Pita, MD  [facility] Direct Dial: [phone]  Fax: [phone] [email or web address]  Skype  [email or web address]  This document serves as a record of services personally performed by Tyler Pita, MD and Freeman Caldron, PA-C. It was created on their behalf by Wilburn Mylar, a trained medical scribe. The creation of this record is based on the scribe's personal observations and the provider's statements to them. This document has been checked and approved by the attending provider.

## 2021-03-10 NOTE — Progress Notes (Signed)
[facility] Psychosocial Distress Screening Spiritual Care  Met with Jeffrey Hanson, who goes by "Jeffrey Hanson," and his wife in [facility] to introduce [facility] team/resources, reviewing distress screen per protocol.  The patient scored a 4 on the Psychosocial Distress Thermometer which indicates moderate distress. Also assessed for distress and other psychosocial needs.   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 They report good support, particularly from their church.   Follow up needed: No. Per couple, no needs or concerns at this time, but they are aware of ongoing Support Team availability, should needs arise or circumstances change.   [place], [place], [facility] Pager [phone] Voicemail [phone]

## 2021-03-13 ENCOUNTER — Telehealth: Payer: Self-pay | Admitting: Oncology

## 2021-03-13 NOTE — Telephone Encounter (Signed)
Checked out appointment. No LOS notes needing to be scheduled. No changes made. 

## 2021-03-15 ENCOUNTER — Ambulatory Visit (HOSPITAL_COMMUNITY): Payer: Medicare PPO

## 2021-03-15 ENCOUNTER — Other Ambulatory Visit: Payer: Self-pay

## 2021-03-15 ENCOUNTER — Ambulatory Visit (HOSPITAL_COMMUNITY)
Admission: RE | Admit: 2021-03-15 | Discharge: 2021-03-15 | Disposition: A | Discharge status: Home / Self Care | Payer: Medicare PPO | Source: Ambulatory Visit | Attending: Nurse Practitioner | Admitting: Nurse Practitioner

## 2021-03-15 DIAGNOSIS — K7469 Other cirrhosis of liver: Secondary | ICD-10-CM | POA: Diagnosis not present

## 2021-03-15 DIAGNOSIS — K8020 Calculus of gallbladder without cholecystitis without obstruction: Secondary | ICD-10-CM | POA: Diagnosis not present

## 2021-03-15 IMAGING — US US ABDOMEN LIMITED RUQ/ASCITES
1 series · 14 of 25 positions shown · non-contrast
Comparison: Abdominal ultrasound [DATE].

CLINICAL DATA: Cirrhosis screen.

EXAM:
ULTRASOUND ABDOMEN LIMITED RIGHT UPPER QUADRANT

[Series 1: us abdomen limited ruq/ascites · 14 of 63 slices shown]
[im 1/63]
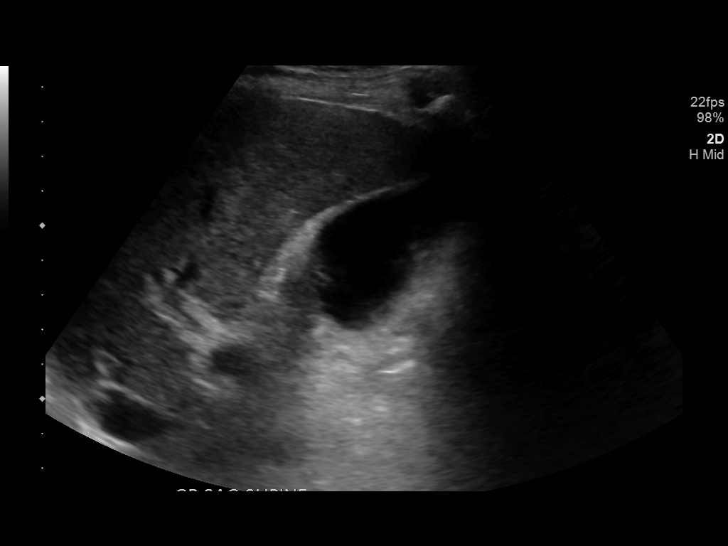
[im 6/63]
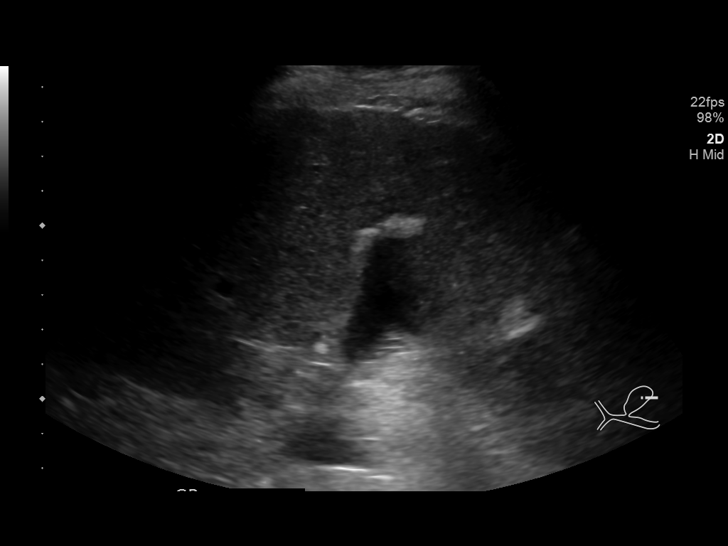
[im 11/63]
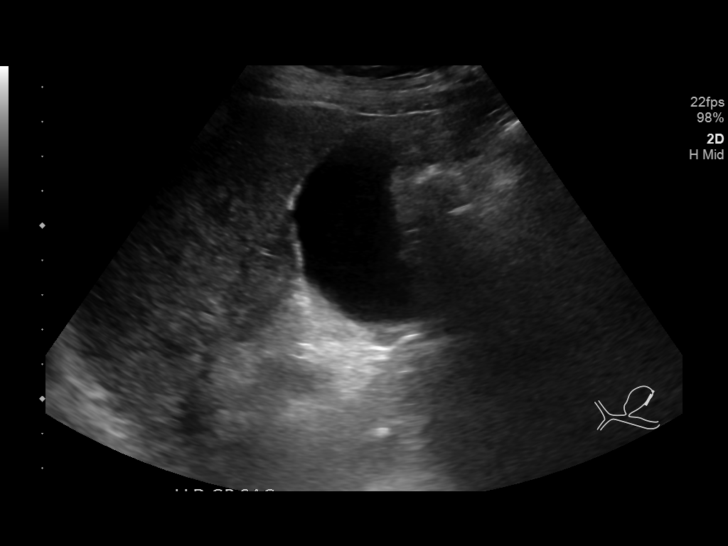
[im 16/63]
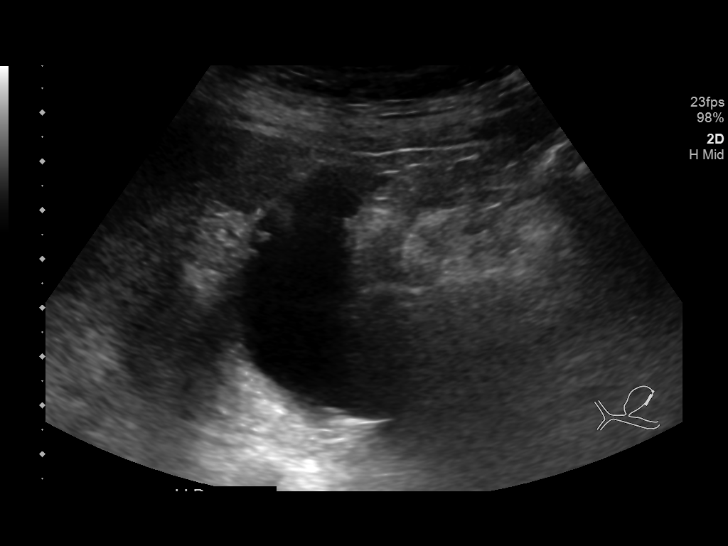
[im 21/63]
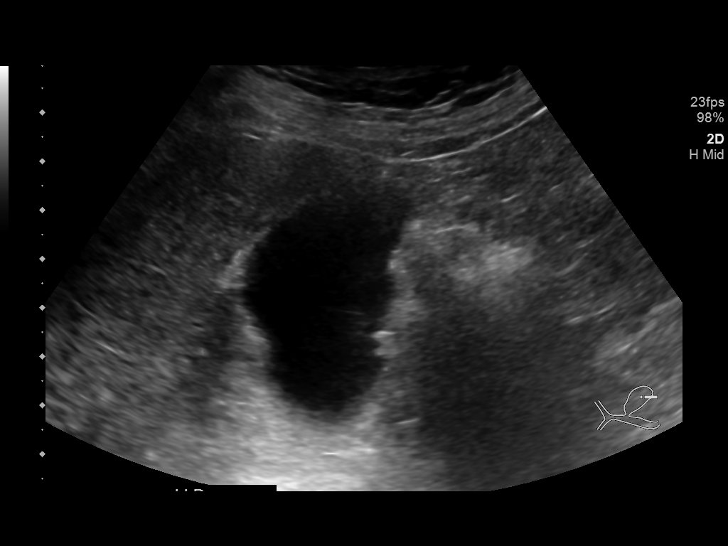
[im 24/63]
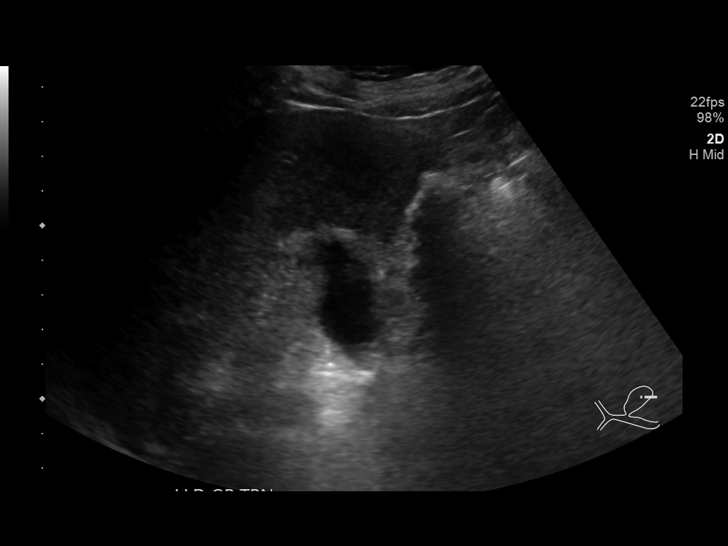
[im 29/63]
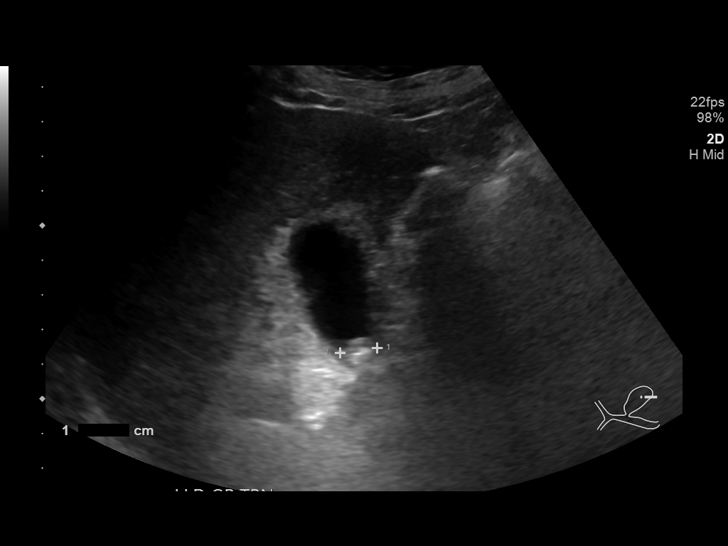
[im 34/63]
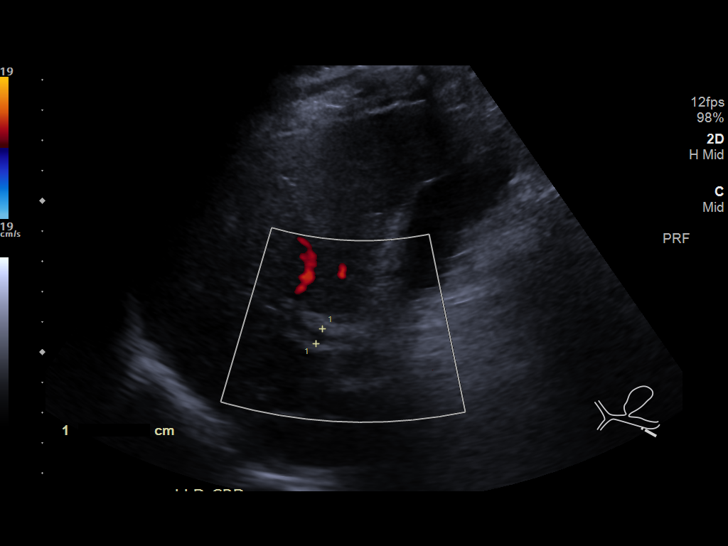
[im 39/63]
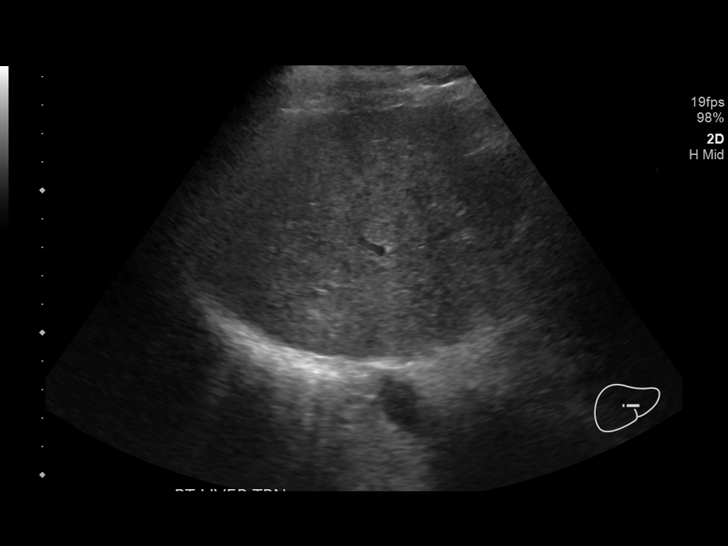
[im 42/63]
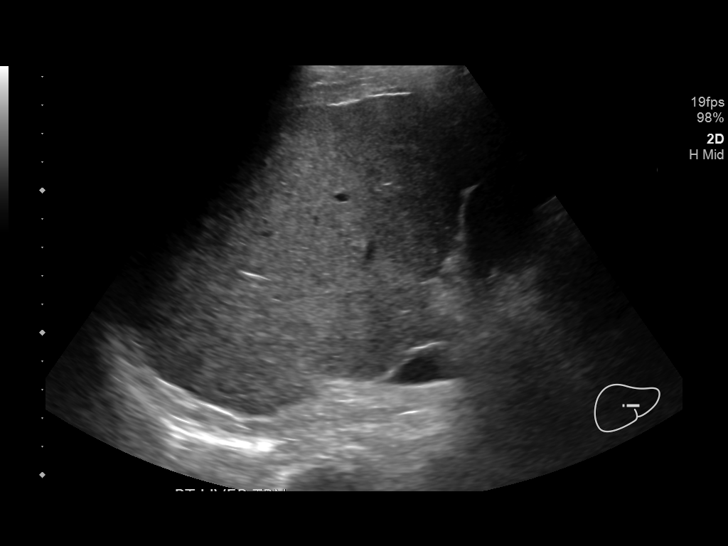
[im 47/63]
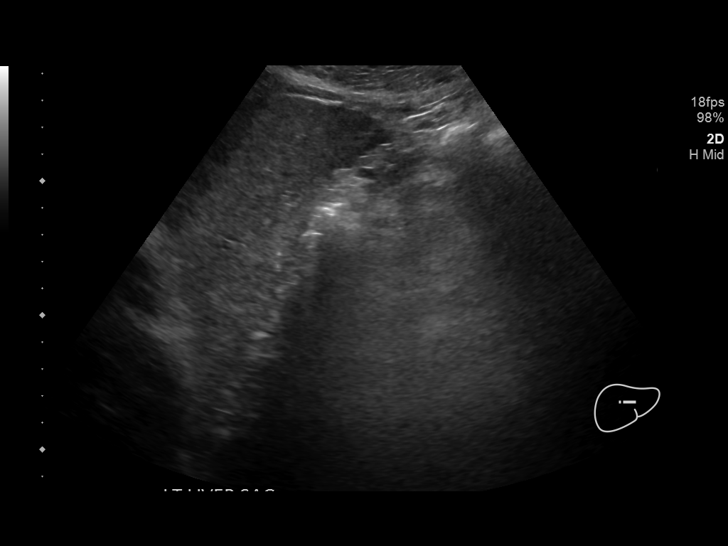
[im 52/63]
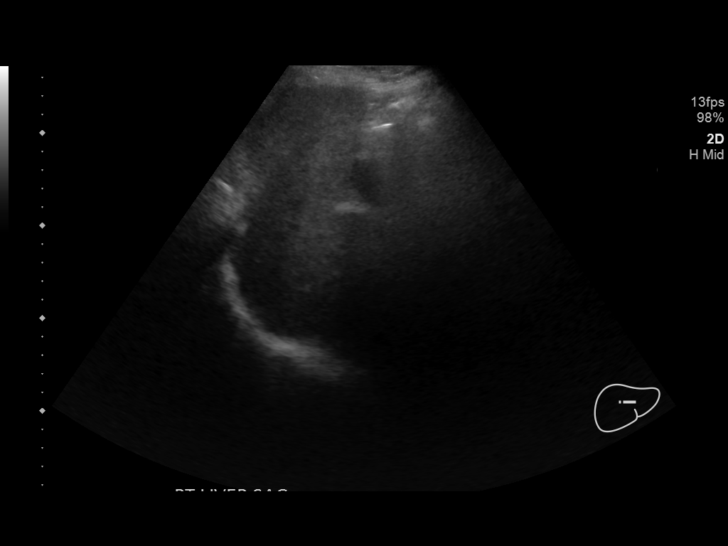
[im 57/63]
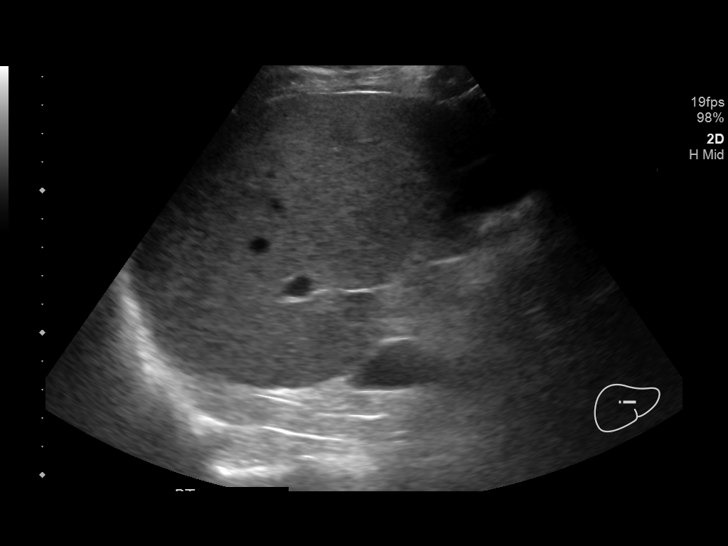
[im 63/63]
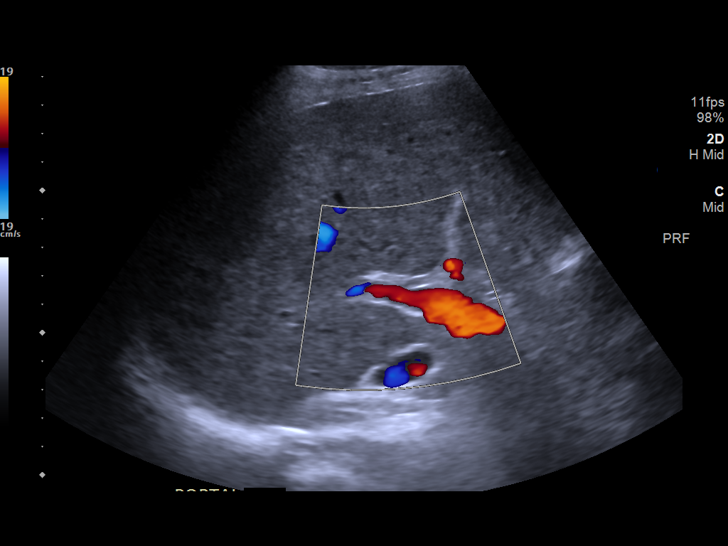

[14 of 25 positions shown; findings below may reference images not displayed]

FINDINGS: Gallbladder:

Cholelithiasis measuring up to 1.1 cm without pericholecystic fluid
or wall thickening visualized. Multiple gallbladder polyps measuring
up to 4 mm. No sonographic Murphy sign noted by sonographer.

Common bile duct:

Diameter: 5 mm

Liver:

No focal lesion identified. Coarsened hepatic echotexture with
nodular hepatic contour. Portal vein is patent on color Doppler
imaging with normal direction of blood flow towards the liver.

Other: None.
IMPRESSION: Cirrhotic hepatic morphology without ascites. No overt hepatic
lesion.

Gallbladder polyps measuring up to 4 mm, no further imaging
evaluation or follow up necessary. Per consensus guidelines, this
requires no additional evaluation or specific follow-up. This
recommendation follows ACR consensus guidelines: White Paper of the
ACR Incidental findings Committee II on Gallbladder and Biliary
Findings. [HOSPITAL] [0K]:;[DATE].

## 2021-03-17 ENCOUNTER — Encounter: Payer: Self-pay | Admitting: Medical Oncology

## 2021-03-20 ENCOUNTER — Encounter (HOSPITAL_COMMUNITY): Payer: Self-pay

## 2021-03-20 ENCOUNTER — Ambulatory Visit (HOSPITAL_COMMUNITY): Payer: Medicare PPO

## 2021-03-21 ENCOUNTER — Ambulatory Visit
Admission: RE | Admit: 2021-03-21 | Discharge: 2021-03-21 | Disposition: A | Discharge status: Home / Self Care | Payer: Medicare PPO | Source: Ambulatory Visit | Attending: Urology | Admitting: Urology

## 2021-03-21 ENCOUNTER — Other Ambulatory Visit: Payer: Self-pay

## 2021-03-21 DIAGNOSIS — I700 Atherosclerosis of aorta: Secondary | ICD-10-CM | POA: Insufficient documentation

## 2021-03-21 DIAGNOSIS — K449 Diaphragmatic hernia without obstruction or gangrene: Secondary | ICD-10-CM | POA: Diagnosis not present

## 2021-03-21 DIAGNOSIS — C61 Malignant neoplasm of prostate: Secondary | ICD-10-CM | POA: Insufficient documentation

## 2021-03-21 IMAGING — PT NM PET TUM IMG SKULL BASE T - THIGH
1 of 10 series · 1 of 25 positions shown · non-contrast
Comparison: Previous imaging from [facility] and [DATE].

CLINICAL DATA: 64-year-old male with history of prostate cancer,
recent diagnosis of high-risk disease.

EXAM:
NUCLEAR MEDICINE PET SKULL BASE TO THIGH
TECHNIQUE: 8.6 mCi F18 Piflufolastat (Pylarify) was injected intravenously.
Full-ring PET imaging was performed from the skull base to thigh
after the radiotracer. CT data was obtained and used for attenuation
correction and anatomic localization.

[Series 3: ct wb 5.0 b30f · axial · 5.0mm · 0.98mm/px · 1 of 290 slices shown]
[im 290/290  brain]
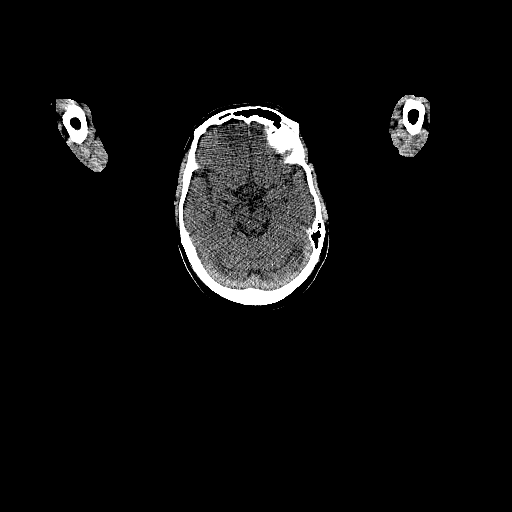

[1 of 25 positions shown; findings below may reference images not displayed]

FINDINGS: NECK

No radiotracer activity in neck lymph nodes.

Incidental CT finding: Heterogeneous and enlarged RIGHT thyroid lobe
measures 3.7 x 2.7 cm.

CHEST

No radiotracer accumulation within mediastinal or hilar lymph nodes.
No suspicious pulmonary nodules on the CT scan.

Incidental CT finding: Large hiatal hernia, slightly greater than
50% of the stomach herniates into the chest. Calcified atheromatous
plaque of the thoracic aorta. No aneurysmal dilation. Normal caliber
central pulmonary vessels. Normal heart size without substantial
effusion. Limited assessment of cardiovascular structures given lack
of intravenous contrast. Basilar atelectasis without effusion or
consolidation.

ABDOMEN/PELVIS

Prostate: Diffuse activity throughout the RIGHT prostate gland
extending to the RIGHT base with a maximum SUV of 30

Lymph nodes: No abnormal radiotracer accumulation within pelvic or
abdominal nodes.

Liver: No evidence of liver metastasis

Incidental CT finding: Liver with nodular contours suggesting
cirrhosis. No pericholecystic stranding. Pancreas without signs of
inflammation. Spleen normal size. Portosystemic collaterals present
in the upper abdomen. Adrenal glands are normal. Smooth renal
contours. No acute gastrointestinal process. Normal appendix.
Calcified atheromatous plaque of the abdominal aorta without
aneurysmal dilation. No adenopathy by size criteria in the abdomen
or pelvis

SKELETON

No focal  activity to suggest skeletal metastasis.
IMPRESSION: Intense radiotracer accumulation in the RIGHT hemi prostate
throughout the RIGHT gland tracking to the RIGHT base in this
patient with known diffuse involvement of the RIGHT prostate.

No signs of adenopathy, bone lesion or visceral metastases.

Enlarged and heterogeneous RIGHT hemithyroid. Recommend thyroid
ultrasound (ref: [HOSPITAL]. [DATE]): 143-50).

Large hiatal hernia similar to prior imaging.

Signs of hepatic cirrhosis.

Atherosclerosis.

Aortic Atherosclerosis ([TN]-[TN]).

## 2021-03-21 MED ORDER — PIFLIFOLASTAT F 18 (PYLARIFY) INJECTION
9.0000 | Freq: Once | INTRAVENOUS | Status: AC
  Administered 2021-03-21: 8.6 via INTRAVENOUS

## 2021-03-22 ENCOUNTER — Encounter: Payer: Self-pay | Admitting: Medical Oncology

## 2021-03-27 ENCOUNTER — Encounter: Payer: Self-pay | Admitting: Medical Oncology

## 2021-03-27 NOTE — Progress Notes (Signed)
Pt concerned he has not been called with results of his PET. I will send Dr. Louis Meckel an in-basket message to let him know patient is very anxious and would like to get results. I shared with him the results are in my chart. He voiced how his anxiety increases when reading the reports. I did share with him there is no bone mets or lymph node involvement. He voiced his appreciation for me helping him read the results. I send Dr. Louis Meckel a message asking to call patient to discuss in detail.

## 2021-03-27 NOTE — Progress Notes (Signed)
Received call from patient stating Dr. Louis Meckel called him at home Friday evening with PET scan results. Dr. Carlton Adam office will schedule the ADT injection for this week.

## 2021-03-28 DIAGNOSIS — Z5111 Encounter for antineoplastic chemotherapy: Secondary | ICD-10-CM | POA: Diagnosis not present

## 2021-03-28 DIAGNOSIS — C61 Malignant neoplasm of prostate: Secondary | ICD-10-CM | POA: Diagnosis not present

## 2021-03-30 ENCOUNTER — Encounter: Payer: Self-pay | Admitting: Medical Oncology

## 2021-03-30 NOTE — Progress Notes (Signed)
Patient called stating he received his hormone injection 3/29. He asked what he needs to do to get seed boost scheduled. I discussed the process and [name] will be in contact with him to schedule appointments. He voiced understanding and appreciation.Jeffrey Hanson

## 2021-04-04 ENCOUNTER — Other Ambulatory Visit: Payer: Self-pay | Admitting: Urology

## 2021-04-04 ENCOUNTER — Telehealth: Payer: Self-pay | Admitting: *Deleted

## 2021-04-04 DIAGNOSIS — C61 Malignant neoplasm of prostate: Secondary | ICD-10-CM

## 2021-04-04 NOTE — Telephone Encounter (Signed)
Called patient to inform of pre-seed appts. for 05-04-21 and implant for 06-01-21, spoke with patient and he is aware of these appts.

## 2021-04-06 ENCOUNTER — Encounter: Payer: Self-pay | Admitting: Medical Oncology

## 2021-04-06 NOTE — Progress Notes (Signed)
Returned call to patient. He asked if it is ok to receive his COVID booster having received ADT last week. He states that Pam, from [facility]  called him regarding seed implant and he discussed with her. I informed him it is ok but he would like to wait a few weeks. He states he had hot flashes for the first time last evening. We dicussed the side effects of ADT. He is scheduled for seed implant 6/2. He had questions about start of radiation and all questions were answered.

## 2021-04-15 DIAGNOSIS — Z20822 Contact with and (suspected) exposure to covid-19: Secondary | ICD-10-CM | POA: Diagnosis not present

## 2021-04-16 DIAGNOSIS — Z20822 Contact with and (suspected) exposure to covid-19: Secondary | ICD-10-CM | POA: Diagnosis not present

## 2021-05-03 ENCOUNTER — Telehealth: Payer: Self-pay | Admitting: *Deleted

## 2021-05-03 NOTE — Progress Notes (Signed)
  Radiation Oncology         [phone]) [phone] ________________________________  Name: Jeffrey Hanson MRN: [id number]  Date: 05/04/2021  DOB: 13-Dec-1956  SIMULATION AND TREATMENT PLANNING NOTE PUBIC ARCH STUDY  DJ:MEQASTM, Herbie [place], MD  Ardis Hughs, MD  DIAGNOSIS:  65 y.o. gentleman with stage T2b adenocarcinoma of the prostate with a Gleason's score of 4+4 and a PSA of 9.43  Oncology History  Malignant neoplasm of prostate ([place])  02/09/2021 Cancer Staging   Staging form: Prostate, AJCC 8th Edition - Clinical stage from 02/09/2021: Stage IIC (cT2a, cN0, cM0, PSA: 9.4, Grade Group: 4) - Signed by Freeman Caldron, PA-C on 03/10/2021 Histopathologic type: Adenocarcinoma, NOS Stage prefix: Initial diagnosis Prostate specific antigen (PSA) range: Less than 10 Gleason primary pattern: 4 Gleason secondary pattern: 4 Gleason score: 8 Histologic grading system: 5 grade system Number of biopsy cores examined: 15 Number of biopsy cores positive: 7 Location of positive needle core biopsies: One side   03/10/2021 Initial Diagnosis   Malignant neoplasm of prostate ([place])       ICD-10-CM   1. Malignant neoplasm of prostate ([place])  C61     COMPLEX SIMULATION:  The patient presented today for evaluation for possible prostate seed implant. He was brought to the radiation planning suite and placed supine on the CT couch. A 3-dimensional image study set was obtained in upload to the planning computer. There, on each axial slice, I contoured the prostate gland. Then, using three-dimensional radiation planning tools I reconstructed the prostate in view of the structures from the transperineal needle pathway to assess for possible pubic arch interference. In doing so, I did not appreciate any pubic arch interference. Also, the patient's prostate volume was estimated based on the drawn structure. The volume was 20 cc.  Given the pubic arch appearance and prostate volume, patient remains a good candidate to  proceed with prostate seed implant. Today, he freely provided informed written consent to proceed.    PLAN: The patient will undergo prostate seed implant boost to be followed by IMRT.   ________________________________  Sheral Apley. Tammi Klippel, M.D.

## 2021-05-03 NOTE — Telephone Encounter (Signed)
Called patient to remind of pre-seed appts. for 05-04-21, spoke with patient and he is aware of these appts. 

## 2021-05-04 ENCOUNTER — Ambulatory Visit
Admission: RE | Admit: 2021-05-04 | Discharge: 2021-05-04 | Disposition: A | Discharge status: Home / Self Care | Payer: Medicare PPO | Source: Ambulatory Visit | Attending: Radiation Oncology | Admitting: Radiation Oncology

## 2021-05-04 ENCOUNTER — Ambulatory Visit (HOSPITAL_COMMUNITY)
Admission: RE | Admit: 2021-05-04 | Discharge: 2021-05-04 | Disposition: A | Discharge status: Home / Self Care | Payer: Medicare PPO | Source: Ambulatory Visit | Attending: Urology | Admitting: Urology

## 2021-05-04 ENCOUNTER — Other Ambulatory Visit: Payer: Self-pay

## 2021-05-04 ENCOUNTER — Encounter (HOSPITAL_COMMUNITY)
Admission: RE | Admit: 2021-05-04 | Discharge: 2021-05-04 | Disposition: A | Discharge status: Home / Self Care | Payer: Medicare PPO | Source: Ambulatory Visit | Attending: Urology | Admitting: Urology

## 2021-05-04 ENCOUNTER — Encounter: Payer: Self-pay | Admitting: Radiation Oncology

## 2021-05-04 ENCOUNTER — Ambulatory Visit
Admission: RE | Admit: 2021-05-04 | Discharge: 2021-05-04 | Disposition: A | Discharge status: Home / Self Care | Payer: Medicare PPO | Source: Ambulatory Visit | Attending: Urology | Admitting: Urology

## 2021-05-04 DIAGNOSIS — Z01818 Encounter for other preprocedural examination: Secondary | ICD-10-CM | POA: Diagnosis not present

## 2021-05-04 DIAGNOSIS — C61 Malignant neoplasm of prostate: Secondary | ICD-10-CM | POA: Diagnosis not present

## 2021-05-04 IMAGING — DX DG CHEST 2V
2 series · 2 of 2 positions shown · non-contrast
Comparison: [DATE]

CLINICAL DATA: Preop seed implant

EXAM:
CHEST - 2 VIEW

[chest pa]
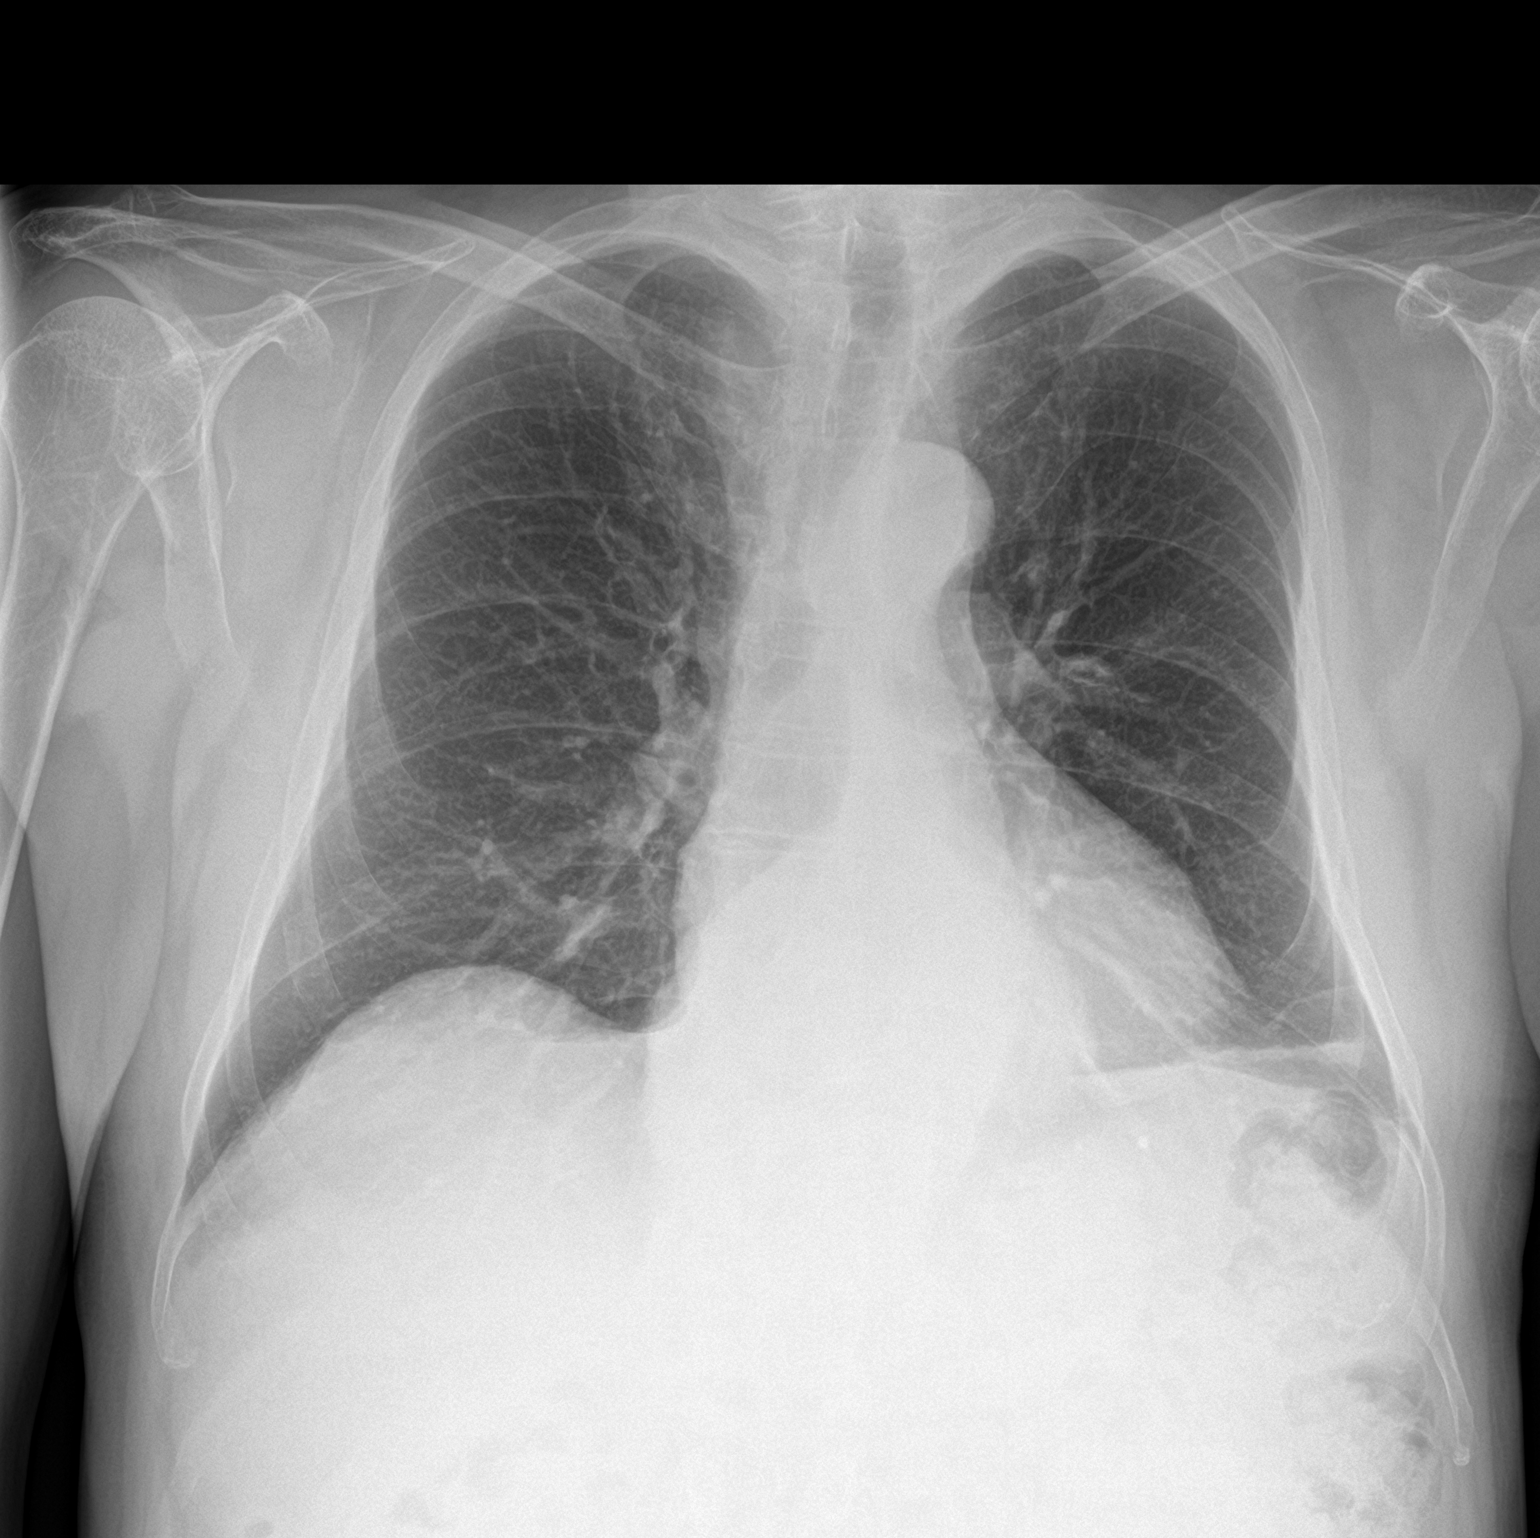

[chest lat]
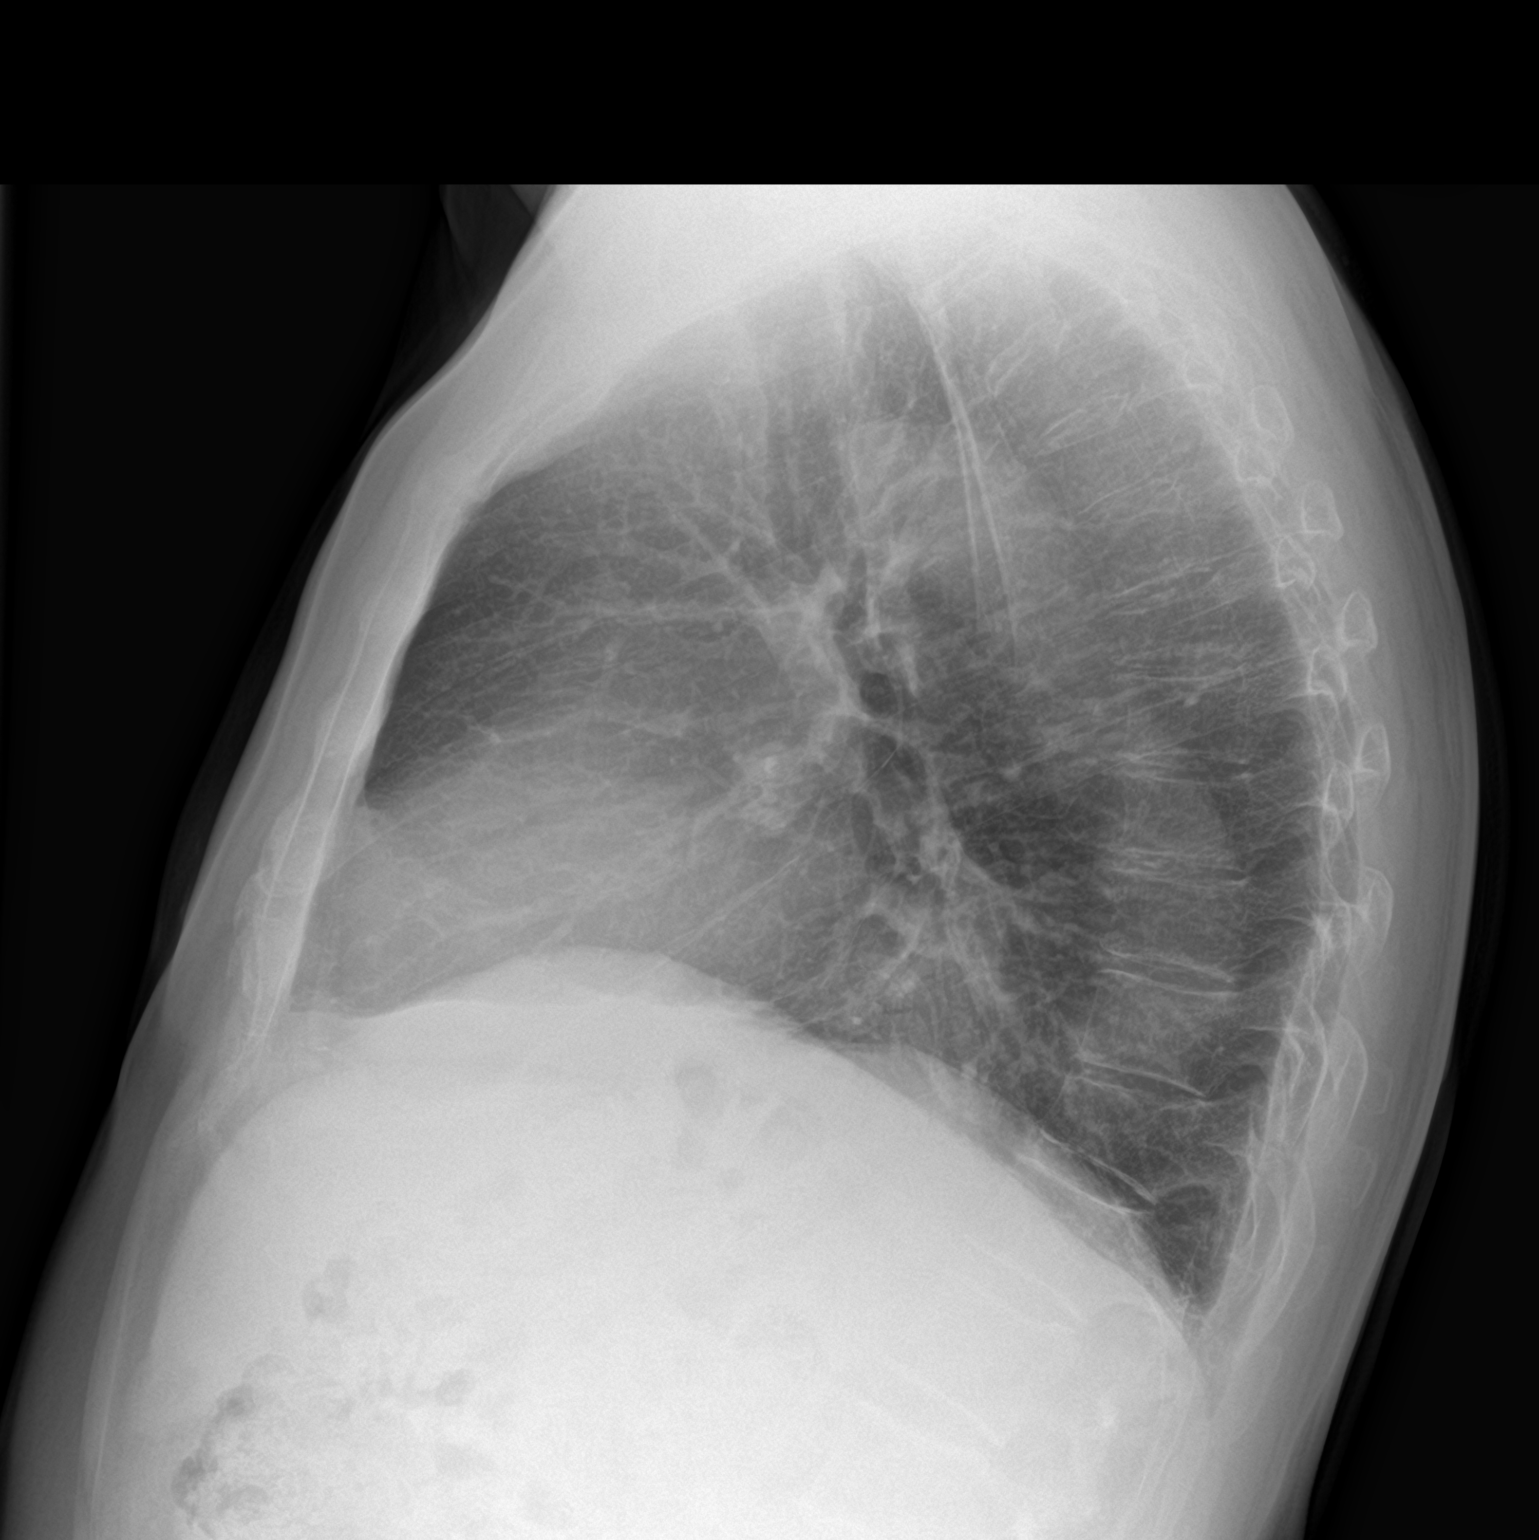

[2 of 2 positions shown; findings below may reference images not displayed]

FINDINGS: Left basilar scarring. No focal consolidation. No pleural effusion
or pneumothorax. Heart and mediastinal contours are unremarkable.

No acute osseous abnormality.
IMPRESSION: No active cardiopulmonary disease.

## 2021-05-04 NOTE — Progress Notes (Signed)
Patient dropped off folder with [place] paperwork inside. The paperwork request a copy of the patient's pathology and itemized bill. Among the paperwork is a release of information. I printed a copy of the patient's prostate biopsy pathology and placed it in the folder. Then, I took the folder to [facility] to complete.

## 2021-05-09 DIAGNOSIS — C61 Malignant neoplasm of prostate: Secondary | ICD-10-CM | POA: Diagnosis not present

## 2021-05-26 ENCOUNTER — Telehealth: Payer: Self-pay | Admitting: *Deleted

## 2021-05-26 ENCOUNTER — Other Ambulatory Visit: Payer: Self-pay

## 2021-05-26 ENCOUNTER — Encounter (HOSPITAL_BASED_OUTPATIENT_CLINIC_OR_DEPARTMENT_OTHER): Payer: Self-pay | Admitting: Urology

## 2021-05-26 NOTE — Telephone Encounter (Signed)
CALLED PATIENT TO REMIND OF LAB APPT. ON 05-30-21 AND HIS IMPLANT ON 06-01-21, SPOKE WITH PATIENT AND HE IS AWARE OF THESE APPTS.

## 2021-05-26 NOTE — Progress Notes (Addendum)
Spoke w/ via phone for pre-op interview---pt Lab needs dos---- PTT,  Repeat,  lab appt 05-30-2021 1015 for cbc cmp pt ptt    (ptt 46 on 05-30-2021)           Lab results------ekg 05-04-2021 epic, chest xray 05-04-2021 epic COVID test -----patient states asymptomatic no test needed Arrive at -------730 am 06-01-2021 NPO after MN NO Solid Food.  Clear liquids from MN until---630 am then npo Med rec completed Medications to take morning of surgery -----nexium, propranolol, azathioprine Diabetic medication -----n/a Patient instructed no nail polish to be worn day of surgery n/a Patient instructed to bring photo id and insurance card day of surgery Patient aware to have Driver (ride ) / caregiver  Wife Curt Bears will stay   for 24 hours after surgery  Patient Special Instructions -----fleets enema am of surgery Pre-Op special Istructions -----no cigars 24 hours before surgery Patient verbalized understanding of instructions that were given at this phone interview. Patient denies shortness of breath, chest pain, fever, cough at this phone interview.  lov transplant 03-08-2021 [facility]  Addendum: spoke with pam gibson and made aware ptt will be drawn day of surgery per anesthesia guidelines.

## 2021-05-30 ENCOUNTER — Encounter (HOSPITAL_COMMUNITY)
Admission: RE | Admit: 2021-05-30 | Discharge: 2021-05-30 | Disposition: A | Discharge status: Home / Self Care | Payer: Medicare PPO | Source: Ambulatory Visit | Attending: Urology | Admitting: Urology

## 2021-05-30 ENCOUNTER — Other Ambulatory Visit: Payer: Self-pay

## 2021-05-30 DIAGNOSIS — Z01812 Encounter for preprocedural laboratory examination: Secondary | ICD-10-CM | POA: Diagnosis not present

## 2021-05-30 LAB — COMPREHENSIVE METABOLIC PANEL
ALT: 21 U/L (ref 0–44)
AST: 38 U/L (ref 15–41)
Albumin: 3.8 g/dL (ref 3.5–5.0)
Alkaline Phosphatase: 90 U/L (ref 38–126)
Anion gap: 5 (ref 5–15)
BUN: 14 mg/dL (ref 8–23)
CO2: 28 mmol/L (ref 22–32)
Calcium: 9.4 mg/dL (ref 8.9–10.3)
Chloride: 107 mmol/L (ref 98–111)
Creatinine, Ser: 0.7 mg/dL (ref 0.61–1.24)
GFR, Estimated: >60 mL/min (ref >60)
Glucose, Bld: 113 mg/dL — ABNORMAL HIGH (ref 70–99)
Potassium: 4.3 mmol/L (ref 3.5–5.1)
Sodium: 140 mmol/L (ref 135–145)
Total Bilirubin: 1.2 mg/dL (ref 0.3–1.2)
Total Protein: 7.5 g/dL (ref 6.5–8.1)

## 2021-05-30 LAB — CBC
HCT: 45.9 % (ref 39.0–52.0)
Hemoglobin: 16.1 g/dL (ref 13.0–17.0)
MCH: 33.6 pg (ref 26.0–34.0)
MCHC: 35.1 g/dL (ref 30.0–36.0)
MCV: 95.8 fL (ref 80.0–100.0)
Platelets: 200 10*3/uL (ref 150–400)
RBC: 4.79 MIL/uL (ref 4.22–5.81)
RDW: 13.6 % (ref 11.5–15.5)
WBC: 5.4 10*3/uL (ref 4.0–10.5)
nRBC: 0 % (ref 0.0–0.2)

## 2021-05-30 LAB — PROTIME-INR
INR: 1.1 (ref 0.8–1.2)
Prothrombin Time: 13.8 seconds (ref 11.4–15.2)

## 2021-05-30 LAB — APTT: aPTT: 65 seconds — ABNORMAL HIGH (ref 24–36)

## 2021-05-30 NOTE — Progress Notes (Signed)
Routed pt and ptt results to dr Louis Meckel via epiuc, left voice mail with Darrel Reach to make dr Louis Meckel aware of pt/ptt results with labs today

## 2021-05-31 NOTE — Anesthesia Preprocedure Evaluation (Addendum)
Anesthesia Evaluation  Patient identified by MRN, date of birth, ID band Patient awake    Reviewed: Allergy & Precautions, NPO status , Patient's Chart, lab work & pertinent test results  Airway Mallampati: II  TM Distance: >3 FB Neck ROM: Full    Dental  (+) Teeth Intact   Pulmonary neg pulmonary ROS, Current Smoker and Patient abstained from smoking.,    Pulmonary exam normal        Cardiovascular hypertension, Pt. on medications and Pt. on home beta blockers  Rhythm:Regular Rate:Normal     Neuro/Psych Depression negative neurological ROS     GI/Hepatic hiatal hernia, GERD  Medicated and Controlled,(+) Cirrhosis       , Hepatitis -  Endo/Other  negative endocrine ROS  Renal/GU negative Renal ROS   Prostate Ca    Musculoskeletal negative musculoskeletal ROS (+)   Abdominal (+)  Abdomen: soft. Bowel sounds: normal.  Peds  Hematology negative hematology ROS (+)   Anesthesia Other Findings   Reproductive/Obstetrics                           Anesthesia Physical Anesthesia Plan  ASA: III  Anesthesia Plan: General   Post-op Pain Management:    Induction: Intravenous  PONV Risk Score and Plan: 1 and Ondansetron, Midazolam and Treatment may vary due to age or medical condition  Airway Management Planned: Mask and LMA  Additional Equipment: None  Intra-op Plan:   Post-operative Plan: Extubation in OR  Informed Consent: I have reviewed the patients History and Physical, chart, labs and discussed the procedure including the risks, benefits and alternatives for the proposed anesthesia with the patient or authorized representative who has indicated his/her understanding and acceptance.     Dental advisory given  Plan Discussed with: CRNA  Anesthesia Plan Comments: (Lab Results      Component                Value               Date                      WBC                       5.4                 05/30/2021                HGB                      16.1                05/30/2021                HCT                      45.9                05/30/2021                MCV                      95.8                05/30/2021                PLT  200                 05/30/2021           Lab Results      Component                Value               Date                      NA                       140                 05/30/2021                K                        4.3                 05/30/2021                CO2                      28                  05/30/2021                GLUCOSE                  113 (H)             05/30/2021                BUN                      14                  05/30/2021                CREATININE               0.70                05/30/2021                CALCIUM                  9.4                 05/30/2021                GFRNONAA                 >60                 05/30/2021          )       Anesthesia Quick Evaluation

## 2021-06-01 ENCOUNTER — Ambulatory Visit (HOSPITAL_BASED_OUTPATIENT_CLINIC_OR_DEPARTMENT_OTHER): Payer: Medicare PPO | Admitting: Anesthesiology

## 2021-06-01 ENCOUNTER — Other Ambulatory Visit: Payer: Self-pay

## 2021-06-01 ENCOUNTER — Encounter (HOSPITAL_BASED_OUTPATIENT_CLINIC_OR_DEPARTMENT_OTHER): Payer: Self-pay | Admitting: Urology

## 2021-06-01 ENCOUNTER — Ambulatory Visit (HOSPITAL_COMMUNITY): Payer: Medicare PPO

## 2021-06-01 ENCOUNTER — Ambulatory Visit (HOSPITAL_BASED_OUTPATIENT_CLINIC_OR_DEPARTMENT_OTHER)
Admission: RE | Admit: 2021-06-01 | Discharge: 2021-06-01 | Disposition: A | Discharge status: Home / Self Care | Payer: Medicare PPO | Attending: Urology | Admitting: Urology

## 2021-06-01 ENCOUNTER — Encounter (HOSPITAL_BASED_OUTPATIENT_CLINIC_OR_DEPARTMENT_OTHER)
Admission: RE | Disposition: A | Discharge status: Home / Self Care | Payer: Self-pay | Source: Home / Self Care | Attending: Urology

## 2021-06-01 DIAGNOSIS — Z882 Allergy status to sulfonamides status: Secondary | ICD-10-CM | POA: Diagnosis not present

## 2021-06-01 DIAGNOSIS — K8020 Calculus of gallbladder without cholecystitis without obstruction: Secondary | ICD-10-CM | POA: Diagnosis not present

## 2021-06-01 DIAGNOSIS — F32A Depression, unspecified: Secondary | ICD-10-CM | POA: Insufficient documentation

## 2021-06-01 DIAGNOSIS — C61 Malignant neoplasm of prostate: Secondary | ICD-10-CM | POA: Insufficient documentation

## 2021-06-01 DIAGNOSIS — F419 Anxiety disorder, unspecified: Secondary | ICD-10-CM | POA: Diagnosis not present

## 2021-06-01 DIAGNOSIS — Z79899 Other long term (current) drug therapy: Secondary | ICD-10-CM | POA: Insufficient documentation

## 2021-06-01 DIAGNOSIS — K760 Fatty (change of) liver, not elsewhere classified: Secondary | ICD-10-CM | POA: Diagnosis not present

## 2021-06-01 DIAGNOSIS — K766 Portal hypertension: Secondary | ICD-10-CM

## 2021-06-01 DIAGNOSIS — Z886 Allergy status to analgesic agent status: Secondary | ICD-10-CM | POA: Diagnosis not present

## 2021-06-01 DIAGNOSIS — Z801 Family history of malignant neoplasm of trachea, bronchus and lung: Secondary | ICD-10-CM | POA: Diagnosis not present

## 2021-06-01 DIAGNOSIS — E785 Hyperlipidemia, unspecified: Secondary | ICD-10-CM | POA: Insufficient documentation

## 2021-06-01 DIAGNOSIS — K219 Gastro-esophageal reflux disease without esophagitis: Secondary | ICD-10-CM | POA: Insufficient documentation

## 2021-06-01 DIAGNOSIS — Z881 Allergy status to other antibiotic agents status: Secondary | ICD-10-CM | POA: Diagnosis not present

## 2021-06-01 DIAGNOSIS — Z885 Allergy status to narcotic agent status: Secondary | ICD-10-CM | POA: Diagnosis not present

## 2021-06-01 DIAGNOSIS — Z888 Allergy status to other drugs, medicaments and biological substances status: Secondary | ICD-10-CM | POA: Insufficient documentation

## 2021-06-01 DIAGNOSIS — F17200 Nicotine dependence, unspecified, uncomplicated: Secondary | ICD-10-CM | POA: Insufficient documentation

## 2021-06-01 DIAGNOSIS — K754 Autoimmune hepatitis: Secondary | ICD-10-CM | POA: Insufficient documentation

## 2021-06-01 HISTORY — PX: RADIOACTIVE SEED IMPLANT: SHX5150

## 2021-06-01 HISTORY — PX: SPACE OAR INSTILLATION: SHX6769

## 2021-06-01 LAB — APTT: aPTT: 56 seconds — ABNORMAL HIGH (ref 24–36)

## 2021-06-01 SURGERY — INSERTION, RADIATION SOURCE, PROSTATE
Anesthesia: General | Site: Rectum

## 2021-06-01 MED ORDER — FENTANYL CITRATE (PF) 100 MCG/2ML IJ SOLN: 25.0000 ug | INTRAMUSCULAR | Status: DC | PRN

## 2021-06-01 MED ORDER — FENTANYL CITRATE (PF) 100 MCG/2ML IJ SOLN
INTRAMUSCULAR | Status: AC
  Filled 2021-06-01: qty 2

## 2021-06-01 MED ORDER — SODIUM CHLORIDE (PF) 0.9 % IJ SOLN
INTRAMUSCULAR | Status: DC | PRN
  Administered 2021-06-01: 10 mL

## 2021-06-01 MED ORDER — LACTATED RINGERS IV SOLN: INTRAVENOUS | Status: DC

## 2021-06-01 MED ORDER — FENTANYL CITRATE (PF) 100 MCG/2ML IJ SOLN
INTRAMUSCULAR | Status: DC | PRN
  Administered 2021-06-01 (×2): 25 ug via INTRAVENOUS
  Administered 2021-06-01: 50 ug via INTRAVENOUS

## 2021-06-01 MED ORDER — TRAMADOL HCL 50 MG PO TABS: 50.0000 mg | ORAL_TABLET | Freq: Four times a day (QID) | ORAL | 0 refills | Status: AC | PRN

## 2021-06-01 MED ORDER — EPHEDRINE SULFATE-NACL 50-0.9 MG/10ML-% IV SOSY
PREFILLED_SYRINGE | INTRAVENOUS | Status: DC | PRN
  Administered 2021-06-01: 10 mg via INTRAVENOUS

## 2021-06-01 MED ORDER — IOHEXOL 300 MG/ML  SOLN
INTRAMUSCULAR | Status: DC | PRN
  Administered 2021-06-01: 7 mL

## 2021-06-01 MED ORDER — PHENYLEPHRINE 40 MCG/ML (10ML) SYRINGE FOR IV PUSH (FOR BLOOD PRESSURE SUPPORT)
PREFILLED_SYRINGE | INTRAVENOUS | Status: AC
  Filled 2021-06-01: qty 10

## 2021-06-01 MED ORDER — PROPOFOL 10 MG/ML IV BOLUS
INTRAVENOUS | Status: AC
  Filled 2021-06-01: qty 20

## 2021-06-01 MED ORDER — CIPROFLOXACIN IN D5W 400 MG/200ML IV SOLN
INTRAVENOUS | Status: AC
  Filled 2021-06-01: qty 200

## 2021-06-01 MED ORDER — FLEET ENEMA 7-19 GM/118ML RE ENEM: 1.0000 | ENEMA | Freq: Once | RECTAL | Status: DC

## 2021-06-01 MED ORDER — LIDOCAINE 2% (20 MG/ML) 5 ML SYRINGE
INTRAMUSCULAR | Status: AC
  Filled 2021-06-01: qty 5

## 2021-06-01 MED ORDER — SODIUM CHLORIDE 0.9 % IV SOLN
INTRAVENOUS | Status: AC | PRN
  Administered 2021-06-01: 1000 mL

## 2021-06-01 MED ORDER — PROMETHAZINE HCL 25 MG/ML IJ SOLN: 6.2500 mg | INTRAMUSCULAR | Status: DC | PRN

## 2021-06-01 MED ORDER — CIPROFLOXACIN IN D5W 400 MG/200ML IV SOLN
400.0000 mg | INTRAVENOUS | Status: AC
  Administered 2021-06-01: 400 mg via INTRAVENOUS

## 2021-06-01 MED ORDER — PROPOFOL 10 MG/ML IV BOLUS
INTRAVENOUS | Status: DC | PRN
  Administered 2021-06-01: 150 mg via INTRAVENOUS

## 2021-06-01 MED ORDER — PHENYLEPHRINE 40 MCG/ML (10ML) SYRINGE FOR IV PUSH (FOR BLOOD PRESSURE SUPPORT)
PREFILLED_SYRINGE | INTRAVENOUS | Status: DC | PRN
  Administered 2021-06-01: 80 ug via INTRAVENOUS

## 2021-06-01 MED ORDER — ONDANSETRON HCL 4 MG/2ML IJ SOLN
INTRAMUSCULAR | Status: AC
  Filled 2021-06-01: qty 2

## 2021-06-01 MED ORDER — ONDANSETRON HCL 4 MG/2ML IJ SOLN
INTRAMUSCULAR | Status: DC | PRN
  Administered 2021-06-01: 4 mg via INTRAVENOUS

## 2021-06-01 MED ORDER — DEXAMETHASONE SODIUM PHOSPHATE 10 MG/ML IJ SOLN
INTRAMUSCULAR | Status: AC
  Filled 2021-06-01: qty 1

## 2021-06-01 MED ORDER — MIDAZOLAM HCL 2 MG/2ML IJ SOLN
INTRAMUSCULAR | Status: AC
  Filled 2021-06-01: qty 2

## 2021-06-01 MED ORDER — TAMSULOSIN HCL 0.4 MG PO CAPS: 0.4000 mg | ORAL_CAPSULE | Freq: Every day | ORAL | 5 refills | Status: AC

## 2021-06-01 MED ORDER — EPHEDRINE 5 MG/ML INJ
INTRAVENOUS | Status: AC
  Filled 2021-06-01: qty 10

## 2021-06-01 MED ORDER — DEXAMETHASONE SODIUM PHOSPHATE 10 MG/ML IJ SOLN
INTRAMUSCULAR | Status: DC | PRN
  Administered 2021-06-01: 5 mg via INTRAVENOUS

## 2021-06-01 MED ORDER — LIDOCAINE 2% (20 MG/ML) 5 ML SYRINGE
INTRAMUSCULAR | Status: DC | PRN
  Administered 2021-06-01: 60 mg via INTRAVENOUS

## 2021-06-01 MED ORDER — PROPRANOLOL HCL 20 MG PO TABS
20.0000 mg | ORAL_TABLET | Freq: Once | ORAL | Status: AC
  Administered 2021-06-01: 20 mg via ORAL
  Filled 2021-06-01: qty 1

## 2021-06-01 MED ORDER — MIDAZOLAM HCL 2 MG/2ML IJ SOLN
INTRAMUSCULAR | Status: DC | PRN
  Administered 2021-06-01: 2 mg via INTRAVENOUS

## 2021-06-01 SURGICAL SUPPLY — 37 items
BAG DRN RND TRDRP ANRFLXCHMBR (UROLOGICAL SUPPLIES) ×2
BAG URINE DRAIN 2000ML AR STRL (UROLOGICAL SUPPLIES) ×3 IMPLANT
BLADE CLIPPER SENSICLIP SURGIC (BLADE) ×3 IMPLANT
CATH FOLEY 2WAY SLVR  5CC 16FR (CATHETERS) ×3
CATH FOLEY 2WAY SLVR 5CC 16FR (CATHETERS) ×2 IMPLANT
CATH ROBINSON RED A/P 16FR (CATHETERS) IMPLANT
CATH ROBINSON RED A/P 20FR (CATHETERS) ×3 IMPLANT
CLOTH BEACON ORANGE TIMEOUT ST (SAFETY) ×3 IMPLANT
CNTNR URN SCR LID CUP LEK RST (MISCELLANEOUS) ×4 IMPLANT
CONT SPEC 4OZ STRL OR WHT (MISCELLANEOUS) ×6
COVER BACK TABLE 60X90IN (DRAPES) ×3 IMPLANT
COVER MAYO STAND STRL (DRAPES) ×3 IMPLANT
DRAPE C-ARM 35X43 STRL (DRAPES) ×3 IMPLANT
DRSG TEGADERM 4X4.75 (GAUZE/BANDAGES/DRESSINGS) ×3 IMPLANT
DRSG TEGADERM 8X12 (GAUZE/BANDAGES/DRESSINGS) ×3 IMPLANT
GAUZE SPONGE 4X4 12PLY STRL LF (GAUZE/BANDAGES/DRESSINGS) ×1 IMPLANT
GLOVE SURG ENC MOIS LTX SZ6.5 (GLOVE) ×3 IMPLANT
GLOVE SURG ENC MOIS LTX SZ7.5 (GLOVE) ×6 IMPLANT
GLOVE SURG ENC MOIS LTX SZ8 (GLOVE) IMPLANT
GLOVE SURG ORTHO LTX SZ8.5 (GLOVE) IMPLANT
GLOVE SURG POLYISO LF SZ6.5 (GLOVE) IMPLANT
GOWN STRL REUS W/TWL XL LVL3 (GOWN DISPOSABLE) ×3 IMPLANT
HOLDER FOLEY CATH W/STRAP (MISCELLANEOUS) ×3 IMPLANT
I SEED AGX100 ×56 IMPLANT
IMPL SPACEOAR VUE SYSTEM (Spacer) IMPLANT
IMPLANT SPACEOAR VUE SYSTEM (Spacer) ×3 IMPLANT
IV NS 1000ML (IV SOLUTION) ×3
IV NS 1000ML BAXH (IV SOLUTION) ×4 IMPLANT
KIT TURNOVER CYSTO (KITS) ×3 IMPLANT
MARKER SKIN DUAL TIP RULER LAB (MISCELLANEOUS) ×3 IMPLANT
PACK CYSTO (CUSTOM PROCEDURE TRAY) ×3 IMPLANT
SUT BONE WAX W31G (SUTURE) IMPLANT
SYR 10ML LL (SYRINGE) ×3 IMPLANT
TOWEL OR 17X26 10 PK STRL BLUE (TOWEL DISPOSABLE) ×3 IMPLANT
UNDERPAD 30X36 HEAVY ABSORB (UNDERPADS AND DIAPERS) ×6 IMPLANT
WATER STERILE IRR 3000ML UROMA (IV SOLUTION) IMPLANT
WATER STERILE IRR 500ML POUR (IV SOLUTION) ×3 IMPLANT

## 2021-06-01 NOTE — H&P (Signed)
Pt presents today for pre-operative history and physical exam in anticipation of radioactive seed and space oar placement by Dr. Louis Meckel on 06/01/21. He is doing well and is without complaint.   Pt denies F/C, HA, CP, SOB, N/V, diarrhea/constipation, back pain, flank pain, hematuria, and dysuria.    HX:   CC: Prostate Cancer   Physician requesting consult: Dr. Burman Nieves  PCP: Dr. Gaynelle Arabian  Location of consult: [facility]   Jeffrey Hanson is a 65 year old gentleman with a past medical history significant for anxiety, depression, GERD, hyperlipidemia, and portal hypertension and autoimmune hepatitis. He is followed by hepatology and apparently had been told last year that he would be high risk for general anesthesia due to concern about his ability to metabolize certain anesthetic drugs. He initially presented to Dr. Louis Meckel in 2019 with an elevated PSA of 4.0. He was supposed to return for a repeat PSA but did not follow up. His PSA was subsequently checked by his PCP and it progressively increased to 9.11 prompting another referral in December 2021. At that time, he was noted to have a concerning nodule on the right side of the prostate. He underwent an MRI of the prostate on 01/22/21 that demonstrated a 3.0 cm PI-RADS 5 lesion on the right side of the prostate with concern for EPE. An MR/US fusion biopsy of the prostate was performed on 02/09/21 and confirmed Gleason 4+4=8 adenocarcinoma with 7 out of 15 cores positive including 3 out of 3 targeted biopsy cores.   Family history:   Imaging studies: PSMA PENDING   PMH: He has a history of anxiety, depression, GERD, hyperlipidemia, portal hypertension, and autoimmune hepatitis as noted above. He was diagnosed with esophageal varices and gallstones and underwent a liver biopsy confirming cirrhotic change with autoimmune hepatitis. He was treated with prednisone for 3 months and is now on  propanolol for his varices and azathioprine for his autoimmune hepatitis. He is followed by Roosevelt Locks, NP with Hepatology and Dr. Havery Moros in GI. His perioperative risk has been assessed by Hepatology and he felt to be at low risk for perioperative morbidity/mortality related to his liver disease with a 0.4% 30 day mortality and 1.0% risk of 90 day mortality.  PSH: History of testicular torsion.   TNM stage: cT3a N0 Mx  PSA: 9.11  Gleason score: 4+4=8 (GG 4)  Biopsy (02/09/21): 7/15 cores positive  Left: Benign  Right: R lateral apex (80%, 4+4=8), R lateral mid (80%, 4+3=7, PNI), R base (80%, 4+3=7), R lateral base (60%, 4+3=7, PNI)  Targeted: 3/3 cores (4+3=7, 95%, 95%, 90%)  Prostate volume: 23.2 cc   Nomogram  OC disease: 14%  EPE: 86%  SVI: 22%  LNI: 27%  PFS (5 year, 10 year): 27%, 16%   Urinary function: IPSS is 1.  Erectile function: SHIM score is 5. This is mostly related to his low libido. He has been evaluated for this and his testosterone levels were normal. He has also seen a psychiatrist for this along with his depression.     ALLERGIES: Atorvastatin - unknown reaction Escitalopram - didn't help and didn't like the way it made him feel Keflex - Nausea Meloxicam - Nausea Oxycodone - feels like hes underwater. no true reaction Percocet Pravastatin - taken off by GI physician. no allergy Sulfa - Hives Sulfur Vicodin - just feels bad. doesn't like it    MEDICATIONS: Nexium  Valium 10 mg tablet 2 tablet PO once  take one hour prior to procedure  Valium 10 mg tablet 1-2 tablet PO once take one hour prior to procedure  Zyrtec 10 mg capsule  Alprazolam 0.5 mg tablet  Azathioprine  Propranolol Hcl 20 mg tablet     Notes: CBD oil under tongue   GU PSH: Prostate Needle Biopsy - 02/09/2021       PSH Notes: Rotators Cuff surgery on L shoulder  Torsion Testicle  Endoscopy  tonsilectomy    NON-GU PSH: Surgical Pathology, Gross And Microscopic Examination For  Prostate Needle - 02/09/2021     GU PMH: Prostate Cancer - 03/28/2021, - 03/10/2021, - 02/16/2021 Elevated PSA - 02/09/2021, - 12/20/2020, - 2019 Prostate nodule w/o LUTS - 02/09/2021, - 12/20/2020      PMH Notes: multinodular goiter, autoimmune hepatitis, hiatal hernia, gastric polyp   NON-GU PMH: Anxiety Arthritis Autoimmune hepatitis Depression Encounter for general adult medical examination without abnormal findings, Encounter for preventive health examination GERD Hypercholesterolemia Portal hypertension    FAMILY HISTORY: 1 Daughter - No Family History 1 son - No Family History Lung Cancer - Mother   SOCIAL HISTORY: Marital Status: Married Preferred Language: English; Race: White Current Smoking Status: Patient smokes occasionally.   Tobacco Use Assessment Completed: Used Tobacco in last 30 days? Does not use smokeless tobacco. Does not drink anymore.  Does not use drugs. Drinks 1 caffeinated drink per day. Has not had a blood transfusion.     Notes: cigars on occasion  ETOH none    REVIEW OF SYSTEMS:    GU Review Male:   Patient denies frequent urination, hard to postpone urination, burning/ pain with urination, get up at night to urinate, leakage of urine, stream starts and stops, trouble starting your stream, have to strain to urinate , erection problems, and penile pain.  Gastrointestinal (Upper):   Patient denies nausea, vomiting, and indigestion/ heartburn.  Gastrointestinal (Lower):   Patient denies diarrhea and constipation.  Constitutional:   Patient denies fever, night sweats, weight loss, and fatigue.  Skin:   Patient denies skin rash/ lesion and itching.  Eyes:   Patient denies blurred vision and double vision.  Ears/ Nose/ Throat:   Patient denies sore throat and sinus problems.  Hematologic/Lymphatic:   Patient denies swollen glands and easy bruising.  Cardiovascular:   Patient denies leg swelling and chest pains.  Respiratory:   Patient denies cough and  shortness of breath.  Endocrine:   Patient denies excessive thirst.  Musculoskeletal:   Patient denies back pain and joint pain.  Neurological:   Patient denies headaches and dizziness.  Psychologic:   Patient denies depression and anxiety.   VITAL SIGNS:      05/09/2021 02:48 PM  Weight 168 lb / 76.2 kg  Height 71 in / 180.34 cm  BP 131/82 mmHg  Pulse 70 /min  Temperature 97.3 F / 36.2 C  BMI 23.4 kg/m   MULTI-SYSTEM PHYSICAL EXAMINATION:    Constitutional: Well-nourished. No physical deformities. Normally developed. Good grooming.  Neck: Neck symmetrical, not swollen. Normal tracheal position.  Respiratory: Normal breath sounds. No labored breathing, no use of accessory muscles.   Cardiovascular: Regular rate and rhythm. No murmur, no gallop.   Lymphatic: No enlargement of neck, axillae, groin.  Skin: No paleness, no jaundice, no cyanosis. No lesion, no ulcer, no rash.  Neurologic / Psychiatric: Oriented to time, oriented to place, oriented to person. No depression, no anxiety, no agitation.  Gastrointestinal: No mass, no tenderness, no rigidity, non obese abdomen.  Eyes:  Normal conjunctivae. Normal eyelids.  Ears, Nose, Mouth, and Throat: Left ear no scars, no lesions, no masses. Right ear no scars, no lesions, no masses. Nose no scars, no lesions, no masses. Normal hearing. Normal lips.  Musculoskeletal: Normal gait and station of head and neck.     Complexity of Data:  Records Review:   Previous Patient Records  Urine Test Review:   Urinalysis   05/09/21  Urinalysis  Urine Appearance Clear   Urine Color Yellow   Urine Glucose Neg mg/dL  Urine Bilirubin Neg mg/dL  Urine Ketones Neg mg/dL  Urine Specific Gravity 1.020   Urine Blood Neg ery/uL  Urine pH 7.0   Urine Protein Trace mg/dL  Urine Urobilinogen 4.0 mg/dL  Urine Nitrites Neg   Urine Leukocyte Esterase Neg leu/uL   PROCEDURES:          Urinalysis - 81003 Dipstick Dipstick Cont'd  Color: Yellow Bilirubin:  Neg mg/dL  Appearance: Clear Ketones: Neg mg/dL  Specific Gravity: 1.020 Blood: Neg ery/uL  pH: 7.0 Protein: Trace mg/dL  Glucose: Neg mg/dL Urobilinogen: 4.0 mg/dL    Nitrites: Neg    Leukocyte Esterase: Neg leu/uL    ASSESSMENT:      ICD-10 Details  1 GU:   Prostate Cancer - C61    PLAN:           Schedule Return Visit/Planned Activity: Keep Scheduled Appointment - Schedule Surgery          Document Letter(s):  Created for Patient: Clinical Summary         Notes:   There are no changes in the patients history or physical exam since last evaluation by Dr. Louis Meckel. Pt is scheduled to undergo radioactive seed and space oar placement on 06/01/21.   All pt's questions were answered to the best of my ability.

## 2021-06-01 NOTE — Interval H&P Note (Signed)
History and Physical Interval Note:  06/01/2021 9:40 AM  Jeffrey Hanson  has presented today for surgery, with the diagnosis of PROSTATE CANCER.  The various methods of treatment have been discussed with the patient and family. After consideration of risks, benefits and other options for treatment, the patient has consented to  Procedure(s): RADIOACTIVE SEED IMPLANT/BRACHYTHERAPY IMPLANT (N/A) SPACE OAR INSTILLATION (N/A) as a surgical intervention.  The patient's history has been reviewed, patient examined, no change in status, stable for surgery.  I have reviewed the patient's chart and labs.  Questions were answered to the patient's satisfaction.     Ardis Hughs

## 2021-06-01 NOTE — Discharge Instructions (Signed)
DISCHARGE INSTRUCTIONS FOR PROSTATE SEED IMPLANTATION   Antibiotics You may be given a prescription for an antibiotic to take when you arrive home. If so, be sure to take every tablet in the bottle, even if you are feeling better before the prescription is finished. If you begin itching, notice a rash or start to swell on your trunk, arms, legs and/or throat, immediately stop taking the antibiotic and call your Urologist. Diet Resume your usual diet when you return home. To keep your bowels moving easily and softly, drink prune, apple and cranberry juice at room temperature. You may also take a stool softener, such as Colace, which is available without prescription at local pharmacies. Daily activities No driving or heavy lifting for at least two days after the implant. No bike riding, horseback riding or riding lawn mowers for the first month after the implant. Any strenuous physical activity should be approved by your doctor before you resume it. Sexual relations You may resume sexual relations two weeks after the procedure. A condom should be used for the first two weeks. Your semen may be dark brown or black; this is normal and is related bleeding that may have occurred during the implant. Postoperative swelling Expect swelling and bruising of the scrotum and perineum (the area between the scrotum and anus). Both the swelling and the bruising should resolve in l or 2 weeks. Ice packs and over- the-counter medications such as Tylenol, Advil or Aleve may lessen your discomfort. Postoperative urination Most men experience burning on urination and/or urinary frequency. If this becomes bothersome, contact your Urologist.  Medication can be prescribed to relieve these problems.  It is normal to have some blood in your urine for a few days after the implant. Special instructions related to the seeds It is unlikely that you will pass an Iodine-125 seed in your urine. The seeds are silver in color and  are about as large as a grain of rice. If you pass a seed, do not handle it with your fingers. Use a spoon to place it in an envelope or jar in place this in base occluded area such as the garage or basement for return to the radiation clinic at your convenience.  Contact your doctor for Temperature greater than 101 F Increasing pain Inability to urinate Follow-up  You should have follow up with your urologist and radiation oncologist about 3 weeks after the procedure. General information regarding Iodine seeds Iodine-125 is a low energy radioactive material. It is not deeply penetrating and loses energy at short distances. Your prostate will absorb the radiation. Objects that are touched or used by the patient do not become radioactive. Body wastes (urine and stool) or body fluids (saliva, tears, semen or blood) are not radioactive. The Nuclear Regulatory Commission (NRC) has determined that no radiation precautions are needed for patients undergoing Iodine-125 seed implantation. The NRC states that such patients do not present a risk to the people around them, including young children and pregnant women. However, in keeping with the general principle that radiation exposure should be kept as low reasonably possible, we suggest the following: Children and pets should not sit on the patient's lap for the first two (2) weeks after the implant. Pregnant (or possibly pregnant) women should avoid prolonged, close contact with the patient for the first two (2) weeks after the implant. A distance of three (3) feet is acceptable. At a distance of three (3) feet, there is no limit to the length of time anyone can   with the patient.     Post Anesthesia Home Care Instructions  Activity: Get plenty of rest for the remainder of the day. A responsible individual must stay with you for 24 hours following the procedure.  For the next 24 hours, DO NOT: -Drive a car -Operate machinery -Drink alcoholic  beverages -Take any medication unless instructed by your physician -Make any legal decisions or sign important papers.  Meals: Start with liquid foods such as gelatin or soup. Progress to regular foods as tolerated. Avoid greasy, spicy, heavy foods. If nausea and/or vomiting occur, drink only clear liquids until the nausea and/or vomiting subsides. Call your physician if vomiting continues.  Special Instructions/Symptoms: Your throat may feel dry or sore from the anesthesia or the breathing tube placed in your throat during surgery. If this causes discomfort, gargle with warm salt water. The discomfort should disappear within 24 hours.      

## 2021-06-01 NOTE — Op Note (Signed)
Preoperative diagnosis:  Clinical stage TI C adenocarcinoma the prostate  Postoperative diagnosis:  Same  Procedure:  #1 I-125 prostate seed implantation  #2 cystourethroscopy #3 instillation of SpaceOAR biogel  Surgeon: Louis Meckel, M.D. Radiation Oncologist: Tyler Pita, M.D.  Anesthesia: Gen.   Indications: Patient  was diagnosed with clinical stage TI C prostate cancer. We had extensive discussion with him about treatment options versus. He elected to proceed with seed implantation. He underwent consultation my office as well as with Dr. Tyler Pita. He appeared to understand the advantages disadvantages potential risks of this treatment option. Full informed consent has been obtained. The patient is had preoperative ciprofloxacin. PAS compression boots were placed.   Technique and findings: Patient was brought the operating room where he had  successful induction of general anesthesia. He was placed in lithotomy position and prepped and draped in usual manner. Appropriate surgical timeout was performed. Radiation oncology department placed a transrectal ultrasound probe anchoring stand. Foley catheter with contrast in the balloon was inserted without difficulty. Anchoring needles were placed within the prostate.  Real-time contouring of the urethra prostate and rectum were performed and the dosing parameters were established. Targeted dose was 145 gray. We then came to the operating suite suite for placement of the needles. A second timeout was performed. All needle passage was done with real-time transrectal ultrasound guidance with the sagittal plane. A total of 17 needles were placed.  56 active seeds were implanted.  The brachytherapy template was then removed.    A site in the midline was selected on the perineum for placement of an 18 g needle with saline.  The needle was advanced above the rectum and below Denonvillier's fascia to the mid gland and confirmed to be in the  midline on transverse imaging.  One cc of saline was injected confirming appropriate expansion of this space.  A total of 5 cc of saline was then injected to open the space further bilaterally.  The saline syringe was then removed and the SpaceOAR hydrogel was injected with good distribution bilaterally.A Foley catheter was removed and flexible cystoscopy failed to show any seeds outside the prostate.  The patient was brought to recovery room in stable condition.

## 2021-06-01 NOTE — Transfer of Care (Signed)
Immediate Anesthesia Transfer of Care Note  Patient: Jeffrey Hanson  Procedure(s) Performed: Procedure(s) (LRB): RADIOACTIVE SEED IMPLANT/BRACHYTHERAPY IMPLANT (N/A) SPACE OAR INSTILLATION (N/A)  Patient Location: PACU  Anesthesia Type: General  Level of Consciousness: awake, oriented, sedated and patient cooperative  Airway & Oxygen Therapy: Patient Spontanous Breathing and Patient connected to face mask oxygen  Post-op Assessment: Report given to PACU RN and Post -op Vital signs reviewed and stable  Post vital signs: Reviewed and stable  Complications: No apparent anesthesia complications Vitals Value Taken Time  BP    Temp    Pulse    Resp    SpO2      Last Pain:  Vitals:   06/01/21 0743  TempSrc: Oral         Complications: No complications documented.

## 2021-06-01 NOTE — Progress Notes (Signed)
  Radiation Oncology         [phone] ________________________________  Name: OVIDIO STEELE MRN: [id number]  Date: 06/01/2021  DOB: [birth year]       Prostate Seed Implant  SK:[email or web address], Herbie [place], MD  No ref. provider found  DIAGNOSIS: 65 y.o. gentleman with stage T2b adenocarcinoma of the prostate with a Gleason's score of 4+4 and a PSA of 9.43  Oncology History  Malignant neoplasm of prostate ([facility])  02/09/2021 Cancer Staging   Staging form: Prostate, AJCC 8th Edition - Clinical stage from 02/09/2021: Stage IIC (cT2a, cN0, cM0, PSA: 9.4, Grade Group: 4) - Signed by Freeman Caldron, PA-C on 03/10/2021 Histopathologic type: Adenocarcinoma, NOS Stage prefix: Initial diagnosis Prostate specific antigen (PSA) range: Less than 10 Gleason primary pattern: 4 Gleason secondary pattern: 4 Gleason score: 8 Histologic grading system: 5 grade system Number of biopsy cores examined: 15 Number of biopsy cores positive: 7 Location of positive needle core biopsies: One side   03/10/2021 Initial Diagnosis   Malignant neoplasm of prostate ([facility])     PROCEDURE: Insertion of radioactive I-125 seeds into the prostate gland.  RADIATION DOSE: 110 Gy, boost therapy.  TECHNIQUE: TERRI MALERBA was brought to the operating room with the urologist. He was placed in the dorsolithotomy position. He was catheterized and a rectal tube was inserted. The perineum was shaved, prepped and draped. The ultrasound probe was then introduced into the rectum to see the prostate gland.  TREATMENT DEVICE: A needle grid was attached to the ultrasound probe stand and anchor needles were placed.  3D PLANNING: The prostate was imaged in 3D using a sagittal sweep of the prostate probe. These images were transferred to the planning computer. There, the prostate, urethra and rectum were defined on each axial reconstructed image. Then, the software created an optimized 3D plan and a few seed positions were adjusted. The quality of  the plan was reviewed using [facility] information for the target and the following two organs at risk:  Urethra and Rectum.  Then the accepted plan was printed and handed off to the radiation therapist.  Under my supervision, the custom loading of the seeds and spacers was carried out and loaded into sealed vicryl sleeves.  These pre-loaded needles were then placed into the needle holder.Marland Kitchen  PROSTATE VOLUME STUDY:  Using transrectal ultrasound the volume of the prostate was verified to be 17 cc.  SPECIAL TREATMENT PROCEDURE/SUPERVISION AND HANDLING: The pre-loaded needles were then delivered under sagittal guidance. A total of 17 needles were used to deposit 56 seeds in the prostate gland. The individual seed activity was 0.235 mCi.  SpaceOAR:  Yes  COMPLEX SIMULATION: At the end of the procedure, an anterior radiograph of the pelvis was obtained to document seed positioning and count. Cystoscopy was performed to check the urethra and bladder.  MICRODOSIMETRY: At the end of the procedure, the patient was emitting 0.14 mR/hr at 1 meter. Accordingly, he was considered safe for hospital discharge.  PLAN: The patient will return to the radiation oncology clinic for post implant CT dosimetry in three weeks.   ________________________________  Sheral Apley Tammi Klippel, M.D.

## 2021-06-01 NOTE — Anesthesia Procedure Notes (Signed)
Procedure Name: LMA Insertion Date/Time: 06/01/2021 9:49 AM Performed by: Suan Halter, CRNA Pre-anesthesia Checklist: Patient identified, Emergency Drugs available, Suction available and Patient being monitored Patient Re-evaluated:Patient Re-evaluated prior to induction Oxygen Delivery Method: Circle system utilized Preoxygenation: Pre-oxygenation with 100% oxygen Induction Type: IV induction Ventilation: Mask ventilation without difficulty LMA: LMA inserted LMA Size: 4.0 Number of attempts: 1 Airway Equipment and Method: Bite block Placement Confirmation: positive ETCO2 Tube secured with: Tape Dental Injury: Teeth and Oropharynx as per pre-operative assessment

## 2021-06-02 ENCOUNTER — Encounter (HOSPITAL_BASED_OUTPATIENT_CLINIC_OR_DEPARTMENT_OTHER): Payer: Self-pay | Admitting: Urology

## 2021-06-02 NOTE — Anesthesia Postprocedure Evaluation (Signed)
Anesthesia Post Note  Patient: Jeffrey Hanson  Procedure(s) Performed: RADIOACTIVE SEED IMPLANT/BRACHYTHERAPY IMPLANT (N/A Prostate) SPACE OAR INSTILLATION (N/A Rectum)     Patient location during evaluation: PACU Anesthesia Type: General Level of consciousness: awake and alert Pain management: pain level controlled Vital Signs Assessment: post-procedure vital signs reviewed and stable Respiratory status: spontaneous breathing, nonlabored ventilation, respiratory function stable and patient connected to nasal cannula oxygen Cardiovascular status: blood pressure returned to baseline and stable Postop Assessment: no apparent nausea or vomiting Anesthetic complications: no   No complications documented.  Last Vitals:  Vitals:   06/01/21 1130 06/01/21 1215  BP: (!) 131/93 127/72  Pulse: 64 63  Resp: 19 20  Temp:  36.6 C  SpO2: 94% 96%    Last Pain:  Vitals:   06/01/21 1215  TempSrc:   PainSc: 0-No pain                 Belenda Cruise P Declin Rajan

## 2021-06-06 ENCOUNTER — Encounter: Payer: Self-pay | Admitting: Medical Oncology

## 2021-06-06 NOTE — Progress Notes (Signed)
Returned call to patient. He had brachytherapy 6/2 and states he is doing well. He could not have asked for the procedure to go any better. He asked if he can start radiation now instead of waiting. He needs to take his grandson back to [place].  I explained he needs to wait 3 weeks post seed implant. He is scheduled for CT simulation 6/16 and will start radiation a week later. He should have treatments completed the first week of August. We discussed what the CT simulation entails and all questions were answered. I asked him to call me with futher questions or concerns. He voiced understanding.

## 2021-06-08 DIAGNOSIS — K7469 Other cirrhosis of liver: Secondary | ICD-10-CM | POA: Diagnosis not present

## 2021-06-08 DIAGNOSIS — K754 Autoimmune hepatitis: Secondary | ICD-10-CM | POA: Diagnosis not present

## 2021-06-08 DIAGNOSIS — I8500 Esophageal varices without bleeding: Secondary | ICD-10-CM | POA: Diagnosis not present

## 2021-06-08 DIAGNOSIS — K766 Portal hypertension: Secondary | ICD-10-CM | POA: Diagnosis not present

## 2021-06-14 ENCOUNTER — Telehealth: Payer: Self-pay | Admitting: *Deleted

## 2021-06-14 NOTE — Progress Notes (Signed)
  Radiation Oncology         [phone]) [phone] ________________________________  Name: Jeffrey Hanson MRN: [id number]  Date: 06/15/2021  DOB: Oct 13, 1956  COMPLEX SIMULATION NOTE  NARRATIVE:  The patient was brought to the [facility] today following prostate seed implantation approximately one month ago.  Identity was confirmed.  All relevant records and images related to the planned course of therapy were reviewed.  Then, the patient was set-up supine.  CT images were obtained.  The CT images were loaded into the planning software.  Then the prostate and rectum were contoured.  Treatment planning then occurred.  The implanted iodine 125 seeds were identified by the physics staff for projection of radiation distribution  I have requested : 3D Simulation  I have requested a DVH of the following structures: Prostate and rectum.    ________________________________  Sheral Apley Tammi Klippel, M.D.

## 2021-06-14 NOTE — Progress Notes (Signed)
  Radiation Oncology         [phone]) [phone] ________________________________  Name: Jeffrey Hanson MRN: [id number]  Date: 06/15/2021  DOB: 1956/02/28  SIMULATION AND TREATMENT PLANNING NOTE    ICD-10-CM   1. Malignant neoplasm of prostate ([facility])  C61       DIAGNOSIS:  65 y.o. gentleman with stage T2b adenocarcinoma of the prostate with a Gleason's score of 4+4 and a PSA of 9.43  NARRATIVE:  The patient was brought to the [facility].  Identity was confirmed.  All relevant records and images related to the planned course of therapy were reviewed.  The patient freely provided informed written consent to proceed with treatment after reviewing the details related to the planned course of therapy. The consent form was witnessed and verified by the simulation staff.  Then, the patient was set-up in a stable reproducible supine position for radiation therapy.  A vacuum lock pillow device was custom fabricated to position his legs in a reproducible immobilized position.  Then, I performed a urethrogram under sterile conditions to identify the prostatic apex.  CT images were obtained.  Surface markings were placed.  The CT images were loaded into the planning software.  Then the prostate target and avoidance structures including the rectum, bladder, bowel and hips were contoured.  Treatment planning then occurred.  The radiation prescription was entered and confirmed.  A total of one complex treatment devices were fabricated. I have requested : Intensity Modulated Radiotherapy (IMRT) is medically necessary for this case for the following reason:  Rectal sparing.  PLAN:  The patient will receive 45 Gy in 25 fractions of 1.8 Gy, to supplement an up-front prostate seed implant boost of 110 Gy to achieve a total nominal dose of 155 Gy.  ________________________________  Sheral Apley Tammi Klippel, M.D.

## 2021-06-14 NOTE — Telephone Encounter (Signed)
CALLED PATIENT TO REMIND OF SIM APPT. FOR 06-15-21- ARRIVAL TIME- 8:15 AM, SPOKE WITH PATIENT AND HE IS AWARE OF THIS APPT.

## 2021-06-15 ENCOUNTER — Other Ambulatory Visit: Payer: Self-pay

## 2021-06-15 ENCOUNTER — Ambulatory Visit
Admission: RE | Admit: 2021-06-15 | Discharge: 2021-06-15 | Disposition: A | Discharge status: Home / Self Care | Payer: Medicare PPO | Source: Ambulatory Visit | Attending: Radiation Oncology | Admitting: Radiation Oncology

## 2021-06-15 DIAGNOSIS — Z510 Encounter for antineoplastic radiation therapy: Secondary | ICD-10-CM | POA: Insufficient documentation

## 2021-06-15 DIAGNOSIS — C61 Malignant neoplasm of prostate: Secondary | ICD-10-CM

## 2021-06-22 DIAGNOSIS — C61 Malignant neoplasm of prostate: Secondary | ICD-10-CM | POA: Diagnosis not present

## 2021-06-26 DIAGNOSIS — Z510 Encounter for antineoplastic radiation therapy: Secondary | ICD-10-CM | POA: Diagnosis not present

## 2021-06-26 DIAGNOSIS — C61 Malignant neoplasm of prostate: Secondary | ICD-10-CM | POA: Diagnosis not present

## 2021-06-27 ENCOUNTER — Ambulatory Visit
Admission: RE | Admit: 2021-06-27 | Discharge: 2021-06-27 | Disposition: A | Discharge status: Home / Self Care | Payer: Medicare PPO | Source: Ambulatory Visit | Attending: Radiation Oncology | Admitting: Radiation Oncology

## 2021-06-27 ENCOUNTER — Other Ambulatory Visit: Payer: Self-pay

## 2021-06-27 DIAGNOSIS — Z510 Encounter for antineoplastic radiation therapy: Secondary | ICD-10-CM | POA: Diagnosis not present

## 2021-06-27 DIAGNOSIS — C61 Malignant neoplasm of prostate: Secondary | ICD-10-CM | POA: Diagnosis not present

## 2021-06-28 ENCOUNTER — Ambulatory Visit
Admission: RE | Admit: 2021-06-28 | Discharge: 2021-06-28 | Disposition: A | Discharge status: Home / Self Care | Payer: Medicare PPO | Source: Ambulatory Visit | Attending: Radiation Oncology | Admitting: Radiation Oncology

## 2021-06-28 DIAGNOSIS — C61 Malignant neoplasm of prostate: Secondary | ICD-10-CM | POA: Diagnosis not present

## 2021-06-28 DIAGNOSIS — Z510 Encounter for antineoplastic radiation therapy: Secondary | ICD-10-CM | POA: Diagnosis not present

## 2021-06-29 ENCOUNTER — Ambulatory Visit
Admission: RE | Admit: 2021-06-29 | Discharge: 2021-06-29 | Disposition: A | Discharge status: Home / Self Care | Payer: Medicare PPO | Source: Ambulatory Visit | Attending: Radiation Oncology | Admitting: Radiation Oncology

## 2021-06-29 ENCOUNTER — Other Ambulatory Visit: Payer: Self-pay

## 2021-06-29 DIAGNOSIS — C61 Malignant neoplasm of prostate: Secondary | ICD-10-CM | POA: Diagnosis not present

## 2021-06-29 DIAGNOSIS — Z510 Encounter for antineoplastic radiation therapy: Secondary | ICD-10-CM | POA: Diagnosis not present

## 2021-06-30 ENCOUNTER — Other Ambulatory Visit: Payer: Self-pay

## 2021-06-30 ENCOUNTER — Ambulatory Visit
Admission: RE | Admit: 2021-06-30 | Discharge: 2021-06-30 | Disposition: A | Discharge status: Home / Self Care | Payer: Medicare PPO | Source: Ambulatory Visit | Attending: Radiation Oncology | Admitting: Radiation Oncology

## 2021-06-30 DIAGNOSIS — Z510 Encounter for antineoplastic radiation therapy: Secondary | ICD-10-CM | POA: Insufficient documentation

## 2021-06-30 DIAGNOSIS — C61 Malignant neoplasm of prostate: Secondary | ICD-10-CM | POA: Insufficient documentation

## 2021-06-30 NOTE — Progress Notes (Signed)
Pt here for patient teaching.    Pt given Radiation and You booklet and skin care instructions.    Reviewed areas of pertinence such as diarrhea, fatigue, hair loss, sexual and fertility changes, skin changes, and urinary and bladder changes .   Pt able to give teach back of to pat skin, use unscented/gentle soap, use baby wipes, have Imodium on hand, and drink plenty of water,avoid applying anything to skin within 4 hours of treatment.   Pt verbalizes understanding of information given and will contact nursing with any questions or concerns.

## 2021-07-04 ENCOUNTER — Other Ambulatory Visit: Payer: Self-pay

## 2021-07-04 ENCOUNTER — Ambulatory Visit
Admission: RE | Admit: 2021-07-04 | Discharge: 2021-07-04 | Disposition: A | Discharge status: Home / Self Care | Payer: Medicare PPO | Source: Ambulatory Visit | Attending: Radiation Oncology | Admitting: Radiation Oncology

## 2021-07-04 DIAGNOSIS — C61 Malignant neoplasm of prostate: Secondary | ICD-10-CM | POA: Diagnosis not present

## 2021-07-04 DIAGNOSIS — Z510 Encounter for antineoplastic radiation therapy: Secondary | ICD-10-CM | POA: Diagnosis not present

## 2021-07-05 ENCOUNTER — Ambulatory Visit
Admission: RE | Admit: 2021-07-05 | Discharge: 2021-07-05 | Disposition: A | Discharge status: Home / Self Care | Payer: Medicare PPO | Source: Ambulatory Visit | Attending: Radiation Oncology | Admitting: Radiation Oncology

## 2021-07-05 DIAGNOSIS — C61 Malignant neoplasm of prostate: Secondary | ICD-10-CM | POA: Diagnosis not present

## 2021-07-05 DIAGNOSIS — Z510 Encounter for antineoplastic radiation therapy: Secondary | ICD-10-CM | POA: Diagnosis not present

## 2021-07-06 ENCOUNTER — Ambulatory Visit
Admission: RE | Admit: 2021-07-06 | Discharge: 2021-07-06 | Disposition: A | Discharge status: Home / Self Care | Payer: Medicare PPO | Source: Ambulatory Visit | Attending: Radiation Oncology | Admitting: Radiation Oncology

## 2021-07-06 ENCOUNTER — Other Ambulatory Visit: Payer: Self-pay

## 2021-07-06 DIAGNOSIS — C61 Malignant neoplasm of prostate: Secondary | ICD-10-CM | POA: Diagnosis not present

## 2021-07-06 DIAGNOSIS — Z510 Encounter for antineoplastic radiation therapy: Secondary | ICD-10-CM | POA: Diagnosis not present

## 2021-07-07 ENCOUNTER — Ambulatory Visit
Admission: RE | Admit: 2021-07-07 | Discharge: 2021-07-07 | Disposition: A | Discharge status: Home / Self Care | Payer: Medicare PPO | Source: Ambulatory Visit | Attending: Radiation Oncology | Admitting: Radiation Oncology

## 2021-07-07 DIAGNOSIS — Z510 Encounter for antineoplastic radiation therapy: Secondary | ICD-10-CM | POA: Diagnosis not present

## 2021-07-07 DIAGNOSIS — C61 Malignant neoplasm of prostate: Secondary | ICD-10-CM | POA: Diagnosis not present

## 2021-07-10 ENCOUNTER — Ambulatory Visit
Admission: RE | Admit: 2021-07-10 | Discharge: 2021-07-10 | Disposition: A | Discharge status: Home / Self Care | Payer: Medicare PPO | Source: Ambulatory Visit | Attending: Radiation Oncology | Admitting: Radiation Oncology

## 2021-07-10 ENCOUNTER — Other Ambulatory Visit: Payer: Self-pay

## 2021-07-10 DIAGNOSIS — C61 Malignant neoplasm of prostate: Secondary | ICD-10-CM | POA: Diagnosis not present

## 2021-07-10 DIAGNOSIS — Z510 Encounter for antineoplastic radiation therapy: Secondary | ICD-10-CM | POA: Diagnosis not present

## 2021-07-11 ENCOUNTER — Ambulatory Visit
Admission: RE | Admit: 2021-07-11 | Discharge: 2021-07-11 | Disposition: A | Discharge status: Home / Self Care | Payer: Medicare PPO | Source: Ambulatory Visit | Attending: Radiation Oncology | Admitting: Radiation Oncology

## 2021-07-11 DIAGNOSIS — C61 Malignant neoplasm of prostate: Secondary | ICD-10-CM | POA: Diagnosis not present

## 2021-07-11 DIAGNOSIS — Z510 Encounter for antineoplastic radiation therapy: Secondary | ICD-10-CM | POA: Diagnosis not present

## 2021-07-12 ENCOUNTER — Ambulatory Visit
Admission: RE | Admit: 2021-07-12 | Discharge: 2021-07-12 | Disposition: A | Discharge status: Home / Self Care | Payer: Medicare PPO | Source: Ambulatory Visit | Attending: Radiation Oncology | Admitting: Radiation Oncology

## 2021-07-12 ENCOUNTER — Other Ambulatory Visit: Payer: Self-pay

## 2021-07-12 DIAGNOSIS — C61 Malignant neoplasm of prostate: Secondary | ICD-10-CM | POA: Diagnosis not present

## 2021-07-12 DIAGNOSIS — Z510 Encounter for antineoplastic radiation therapy: Secondary | ICD-10-CM | POA: Diagnosis not present

## 2021-07-13 ENCOUNTER — Ambulatory Visit
Admission: RE | Admit: 2021-07-13 | Discharge: 2021-07-13 | Disposition: A | Discharge status: Home / Self Care | Payer: Medicare PPO | Source: Ambulatory Visit | Attending: Radiation Oncology | Admitting: Radiation Oncology

## 2021-07-13 DIAGNOSIS — C61 Malignant neoplasm of prostate: Secondary | ICD-10-CM | POA: Diagnosis not present

## 2021-07-13 DIAGNOSIS — Z510 Encounter for antineoplastic radiation therapy: Secondary | ICD-10-CM | POA: Diagnosis not present

## 2021-07-14 ENCOUNTER — Ambulatory Visit: Payer: Medicare PPO

## 2021-07-17 ENCOUNTER — Other Ambulatory Visit: Payer: Self-pay

## 2021-07-17 ENCOUNTER — Ambulatory Visit
Admission: RE | Admit: 2021-07-17 | Discharge: 2021-07-17 | Disposition: A | Discharge status: Home / Self Care | Payer: Medicare PPO | Source: Ambulatory Visit | Attending: Radiation Oncology | Admitting: Radiation Oncology

## 2021-07-17 DIAGNOSIS — C61 Malignant neoplasm of prostate: Secondary | ICD-10-CM | POA: Diagnosis not present

## 2021-07-17 DIAGNOSIS — Z510 Encounter for antineoplastic radiation therapy: Secondary | ICD-10-CM | POA: Diagnosis not present

## 2021-07-18 ENCOUNTER — Ambulatory Visit
Admission: RE | Admit: 2021-07-18 | Discharge: 2021-07-18 | Disposition: A | Discharge status: Home / Self Care | Payer: Medicare PPO | Source: Ambulatory Visit | Attending: Radiation Oncology | Admitting: Radiation Oncology

## 2021-07-18 DIAGNOSIS — C61 Malignant neoplasm of prostate: Secondary | ICD-10-CM | POA: Diagnosis not present

## 2021-07-18 DIAGNOSIS — Z510 Encounter for antineoplastic radiation therapy: Secondary | ICD-10-CM | POA: Diagnosis not present

## 2021-07-19 ENCOUNTER — Ambulatory Visit
Admission: RE | Admit: 2021-07-19 | Discharge: 2021-07-19 | Disposition: A | Discharge status: Home / Self Care | Payer: Medicare PPO | Source: Ambulatory Visit | Attending: Radiation Oncology | Admitting: Radiation Oncology

## 2021-07-19 ENCOUNTER — Other Ambulatory Visit: Payer: Self-pay

## 2021-07-19 DIAGNOSIS — Z510 Encounter for antineoplastic radiation therapy: Secondary | ICD-10-CM | POA: Diagnosis not present

## 2021-07-19 DIAGNOSIS — C61 Malignant neoplasm of prostate: Secondary | ICD-10-CM | POA: Diagnosis not present

## 2021-07-20 ENCOUNTER — Ambulatory Visit
Admission: RE | Admit: 2021-07-20 | Discharge: 2021-07-20 | Disposition: A | Discharge status: Home / Self Care | Payer: Medicare PPO | Source: Ambulatory Visit | Attending: Radiation Oncology | Admitting: Radiation Oncology

## 2021-07-20 DIAGNOSIS — C61 Malignant neoplasm of prostate: Secondary | ICD-10-CM | POA: Diagnosis not present

## 2021-07-20 DIAGNOSIS — Z510 Encounter for antineoplastic radiation therapy: Secondary | ICD-10-CM | POA: Diagnosis not present

## 2021-07-21 ENCOUNTER — Other Ambulatory Visit: Payer: Self-pay

## 2021-07-21 ENCOUNTER — Ambulatory Visit
Admission: RE | Admit: 2021-07-21 | Discharge: 2021-07-21 | Disposition: A | Discharge status: Home / Self Care | Payer: Medicare PPO | Source: Ambulatory Visit | Attending: Radiation Oncology | Admitting: Radiation Oncology

## 2021-07-21 DIAGNOSIS — Z510 Encounter for antineoplastic radiation therapy: Secondary | ICD-10-CM | POA: Diagnosis not present

## 2021-07-21 DIAGNOSIS — C61 Malignant neoplasm of prostate: Secondary | ICD-10-CM | POA: Diagnosis not present

## 2021-07-24 ENCOUNTER — Other Ambulatory Visit: Payer: Self-pay

## 2021-07-24 ENCOUNTER — Ambulatory Visit
Admission: RE | Admit: 2021-07-24 | Discharge: 2021-07-24 | Disposition: A | Discharge status: Home / Self Care | Payer: Medicare PPO | Source: Ambulatory Visit | Attending: Radiation Oncology | Admitting: Radiation Oncology

## 2021-07-24 DIAGNOSIS — Z510 Encounter for antineoplastic radiation therapy: Secondary | ICD-10-CM | POA: Diagnosis not present

## 2021-07-24 DIAGNOSIS — C61 Malignant neoplasm of prostate: Secondary | ICD-10-CM | POA: Diagnosis not present

## 2021-07-25 ENCOUNTER — Ambulatory Visit
Admission: RE | Admit: 2021-07-25 | Discharge: 2021-07-25 | Disposition: A | Discharge status: Home / Self Care | Payer: Medicare PPO | Source: Ambulatory Visit | Attending: Radiation Oncology | Admitting: Radiation Oncology

## 2021-07-25 DIAGNOSIS — Z510 Encounter for antineoplastic radiation therapy: Secondary | ICD-10-CM | POA: Diagnosis not present

## 2021-07-25 DIAGNOSIS — C61 Malignant neoplasm of prostate: Secondary | ICD-10-CM | POA: Diagnosis not present

## 2021-07-26 ENCOUNTER — Ambulatory Visit
Admission: RE | Admit: 2021-07-26 | Discharge: 2021-07-26 | Disposition: A | Discharge status: Home / Self Care | Payer: Medicare PPO | Source: Ambulatory Visit | Attending: Radiation Oncology | Admitting: Radiation Oncology

## 2021-07-26 ENCOUNTER — Other Ambulatory Visit: Payer: Self-pay

## 2021-07-26 DIAGNOSIS — C61 Malignant neoplasm of prostate: Secondary | ICD-10-CM | POA: Diagnosis not present

## 2021-07-26 DIAGNOSIS — Z510 Encounter for antineoplastic radiation therapy: Secondary | ICD-10-CM | POA: Diagnosis not present

## 2021-07-27 ENCOUNTER — Ambulatory Visit
Admission: RE | Admit: 2021-07-27 | Discharge: 2021-07-27 | Disposition: A | Discharge status: Home / Self Care | Payer: Medicare PPO | Source: Ambulatory Visit | Attending: Radiation Oncology | Admitting: Radiation Oncology

## 2021-07-27 DIAGNOSIS — Z510 Encounter for antineoplastic radiation therapy: Secondary | ICD-10-CM | POA: Diagnosis not present

## 2021-07-27 DIAGNOSIS — C61 Malignant neoplasm of prostate: Secondary | ICD-10-CM | POA: Diagnosis not present

## 2021-07-28 ENCOUNTER — Other Ambulatory Visit: Payer: Self-pay

## 2021-07-28 ENCOUNTER — Ambulatory Visit
Admission: RE | Admit: 2021-07-28 | Discharge: 2021-07-28 | Disposition: A | Discharge status: Home / Self Care | Payer: Medicare PPO | Source: Ambulatory Visit | Attending: Radiation Oncology | Admitting: Radiation Oncology

## 2021-07-28 DIAGNOSIS — C61 Malignant neoplasm of prostate: Secondary | ICD-10-CM | POA: Diagnosis not present

## 2021-07-28 DIAGNOSIS — Z510 Encounter for antineoplastic radiation therapy: Secondary | ICD-10-CM | POA: Diagnosis not present

## 2021-07-31 ENCOUNTER — Ambulatory Visit
Admission: RE | Admit: 2021-07-31 | Discharge: 2021-07-31 | Disposition: A | Discharge status: Home / Self Care | Payer: Medicare PPO | Source: Ambulatory Visit | Attending: Radiation Oncology | Admitting: Radiation Oncology

## 2021-07-31 ENCOUNTER — Other Ambulatory Visit: Payer: Self-pay

## 2021-07-31 DIAGNOSIS — Z510 Encounter for antineoplastic radiation therapy: Secondary | ICD-10-CM | POA: Diagnosis not present

## 2021-07-31 DIAGNOSIS — C61 Malignant neoplasm of prostate: Secondary | ICD-10-CM | POA: Insufficient documentation

## 2021-08-01 ENCOUNTER — Ambulatory Visit
Admission: RE | Admit: 2021-08-01 | Discharge: 2021-08-01 | Disposition: A | Discharge status: Home / Self Care | Payer: Medicare PPO | Source: Ambulatory Visit | Attending: Radiation Oncology | Admitting: Radiation Oncology

## 2021-08-01 DIAGNOSIS — Z510 Encounter for antineoplastic radiation therapy: Secondary | ICD-10-CM | POA: Diagnosis not present

## 2021-08-01 DIAGNOSIS — C61 Malignant neoplasm of prostate: Secondary | ICD-10-CM | POA: Diagnosis not present

## 2021-08-02 ENCOUNTER — Encounter: Payer: Self-pay | Admitting: Urology

## 2021-08-02 ENCOUNTER — Ambulatory Visit
Admission: RE | Admit: 2021-08-02 | Discharge: 2021-08-02 | Disposition: A | Discharge status: Home / Self Care | Payer: Medicare PPO | Source: Ambulatory Visit | Attending: Radiation Oncology | Admitting: Radiation Oncology

## 2021-08-02 DIAGNOSIS — C61 Malignant neoplasm of prostate: Secondary | ICD-10-CM | POA: Diagnosis not present

## 2021-08-02 DIAGNOSIS — Z510 Encounter for antineoplastic radiation therapy: Secondary | ICD-10-CM | POA: Diagnosis not present

## 2021-08-15 ENCOUNTER — Encounter: Payer: Self-pay | Admitting: Radiation Oncology

## 2021-08-15 DIAGNOSIS — Z510 Encounter for antineoplastic radiation therapy: Secondary | ICD-10-CM | POA: Diagnosis not present

## 2021-08-15 DIAGNOSIS — C61 Malignant neoplasm of prostate: Secondary | ICD-10-CM | POA: Diagnosis not present

## 2021-08-16 DIAGNOSIS — K7460 Unspecified cirrhosis of liver: Secondary | ICD-10-CM | POA: Diagnosis not present

## 2021-08-16 DIAGNOSIS — E7800 Pure hypercholesterolemia, unspecified: Secondary | ICD-10-CM | POA: Diagnosis not present

## 2021-08-16 DIAGNOSIS — Z0000 Encounter for general adult medical examination without abnormal findings: Secondary | ICD-10-CM | POA: Diagnosis not present

## 2021-08-16 DIAGNOSIS — K766 Portal hypertension: Secondary | ICD-10-CM | POA: Diagnosis not present

## 2021-08-16 DIAGNOSIS — E042 Nontoxic multinodular goiter: Secondary | ICD-10-CM | POA: Diagnosis not present

## 2021-08-16 DIAGNOSIS — K219 Gastro-esophageal reflux disease without esophagitis: Secondary | ICD-10-CM | POA: Diagnosis not present

## 2021-08-16 DIAGNOSIS — K754 Autoimmune hepatitis: Secondary | ICD-10-CM | POA: Diagnosis not present

## 2021-08-16 DIAGNOSIS — K449 Diaphragmatic hernia without obstruction or gangrene: Secondary | ICD-10-CM | POA: Diagnosis not present

## 2021-08-16 DIAGNOSIS — K8020 Calculus of gallbladder without cholecystitis without obstruction: Secondary | ICD-10-CM | POA: Diagnosis not present

## 2021-08-21 NOTE — Progress Notes (Signed)
  Radiation Oncology         [phone]) [phone] ________________________________  Name: Jeffrey Hanson MRN: [id number]  Date: 08/15/2021  DOB: [birth year]  3D Planning Note   Prostate Brachytherapy Post-Implant Dosimetry  Diagnosis: 65 y.o. gentleman with stage T2b adenocarcinoma of the prostate with a Gleason's score of 4+4 and a PSA of 9.43  Narrative: On a previous date, Jeffrey Hanson returned following prostate seed implantation for post implant planning. He underwent CT scan complex simulation to delineate the three-dimensional structures of the pelvis and demonstrate the radiation distribution.  Since that time, the seed localization, and complex isodose planning with dose volume histograms have now been completed.  Results:   Prostate Coverage - The dose of radiation delivered to the 90% or more of the prostate gland (D90) was 111.98% of the prescription dose. This exceeds our goal of greater than 90%. Rectal Sparing - The volume of rectal tissue receiving the prescription dose or higher was 0.0 cc. This falls under our thresholds tolerance of 1.0 cc.  Impression: The prostate seed implant appears to show adequate target coverage and appropriate rectal sparing.  Plan:  The patient will continue to follow with urology for ongoing PSA determinations. I would anticipate a high likelihood for local tumor control with minimal risk for rectal morbidity.  ________________________________  Sheral Apley Tammi Klippel, M.D.

## 2021-08-30 ENCOUNTER — Other Ambulatory Visit: Payer: Self-pay

## 2021-08-30 DIAGNOSIS — R932 Abnormal findings on diagnostic imaging of liver and biliary tract: Secondary | ICD-10-CM

## 2021-08-30 DIAGNOSIS — K7460 Unspecified cirrhosis of liver: Secondary | ICD-10-CM

## 2021-08-30 DIAGNOSIS — K754 Autoimmune hepatitis: Secondary | ICD-10-CM

## 2021-08-31 ENCOUNTER — Telehealth: Payer: Self-pay

## 2021-08-31 NOTE — Telephone Encounter (Signed)
-----   Message from Roetta Sessions, [place] sent at 03/23/2021  4:57 PM EDT ----- Regarding: FW: u/s liver Patient due for U/S for [facility] screening -cirrhosis/ autoimmune hepatitis    ----- Message ----- From: Yetta Flock, MD Sent: 03/23/2021   4:37 PM EDT To: Roetta Sessions, CMA Subject: RE: u/s liver                                  Thanks, can you place recall for 08/2021. Thanks  ----- Message ----- From: Roetta Sessions, CMA Sent: 03/23/2021   8:14 AM EDT To: Yetta Flock, MD Subject: FW: u/s liver                                  For your review.  Patient had ultrasound 03-15-21   ----- Message ----- From: Roetta Sessions, CMA Sent: 03/23/2021   8:03 AM EDT To: Roetta Sessions, CMA Subject: FW: u/s liver                                  For your review.  Patient had u/s 03-15-21     ----- Message ----- From: Roetta Sessions, CMA Sent: 03/23/2021  12:00 AM EDT To: Roetta Sessions, CMA Subject: FW: u/s liver                                   ----- Message ----- From: Roetta Sessions, CMA Sent: 03/22/2021  12:00 AM EDT To: Roetta Sessions, CMA Subject: u/s liver                                      Patient had to cx his RUQ u/S bc he was Dx with prostate cancer.  Was going to have CT  Have [facility] review CT when results

## 2021-08-31 NOTE — Telephone Encounter (Signed)
Patient scheduled for RUQ ultrasound on Thursday, 9-15 at 9:00 am to arrive at 8:45am.  NPO after midnight. MyChart message sent to pt

## 2021-09-11 DIAGNOSIS — C61 Malignant neoplasm of prostate: Secondary | ICD-10-CM | POA: Diagnosis not present

## 2021-09-12 ENCOUNTER — Encounter: Payer: Self-pay | Admitting: Urology

## 2021-09-12 NOTE — Progress Notes (Addendum)
Patient reports improved fatigue. No other symptoms to report at this time.  I-PSS score of 1 (mild) Meaningful use complete.  Patient notified of 9:00am-09/13/21 telephone appointment and understands.

## 2021-09-12 NOTE — Progress Notes (Signed)
  Radiation Oncology         [phone]) [phone] ________________________________  Name: Jeffrey Hanson MRN: [id number]  Date: 08/02/2021  DOB: 20-Mar-1956  End of Treatment Note  Diagnosis:   65 y.o. gentleman with stage T2b adenocarcinoma of the prostate with a Gleason's score of 4+4 and a PSA of 9.43     Indication for treatment:  Curative, Definitive Radiotherapy       Radiation treatment dates:    06/01/21  06/27/21 - 08/02/21  Site/dose:  1. Insertion of radioactive I-125 seeds into the prostate gland;110 Gy, boost therapy.  2.  The prostate, seminal vesicles, and pelvic lymph nodes were treated to 45 Gy in 25 fractions of 1.8 Gy, to supplement an up-front prostate seed implant boost of 110 Gy to achieve a total nominal dose of 155 Gy.   Beams/energy:  1. Brachytherapy with SpaceOAR gel placement. A total of 17 needles were used to deposit 56 seeds in the prostate gland. The individual seed activity was 0.235 mCi.  2.  The prostate, seminal vesicles, and pelvic lymph nodes were initially treated using VMAT intensity modulated radiotherapy delivering 6 megavolt photons. Image guidance was performed with CB-CT studies prior to each fraction. He was immobilized with a body fix lower extremity mold.   Narrative: The patient tolerated radiation treatment relatively well.   The patient experienced some minor urinary irritation and modest fatigue.  He did not report any bowel issues.  Plan: The patient has completed radiation treatment. He will return to radiation oncology clinic for routine followup in one month. I advised him to call or return sooner if he has any questions or concerns related to his recovery or treatment. ________________________________  Sheral Apley. Tammi Klippel, M.D.

## 2021-09-13 ENCOUNTER — Ambulatory Visit
Admission: RE | Admit: 2021-09-13 | Discharge: 2021-09-13 | Disposition: A | Discharge status: Home / Self Care | Payer: Medicare PPO | Source: Ambulatory Visit | Attending: Urology | Admitting: Urology

## 2021-09-13 DIAGNOSIS — C61 Malignant neoplasm of prostate: Secondary | ICD-10-CM

## 2021-09-13 NOTE — Progress Notes (Signed)
Radiation Oncology         ([phone]) [phone] ________________________________  Name: CASSEY BATESON MRN: [id number]  Date: 09/13/2021  DOB: 21-Mar-1956  Post Treatment Note  CC: Gaynelle Arabian, MD  Ardis Hughs, MD  Diagnosis:   65 y.o. gentleman with stage T2b adenocarcinoma of the prostate with a Gleason's score of 4+4 and a PSA of 9.43     Interval Since Last Radiation:  6 weeks (concurrent with LT-ADT, started 03/28/2021 with 48-monthEligard) 06/01/21: Insertion of radioactive I-125 seeds into the prostate gland;110 Gy, boost therapy.  06/27/21 - 08/02/21: The prostate, seminal vesicles, and pelvic lymph nodes were treated to 45 Gy in 25 fractions of 1.8 Gy, to supplement an up-front prostate seed implant boost of 110 Gy to achieve a total nominal dose of 155 Gy.   Narrative:  I spoke with the patient to conduct his routine scheduled 1 month follow up visit via telephone to spare the patient unnecessary potential exposure in the healthcare setting during the current COVID-19 pandemic.  The patient was notified in advance and gave permission to proceed with this visit format.  He tolerated radiation treatment relatively well with only minor urinary irritation and modest fatigue.  He did not report any bowel issues.                              On review of systems, the patient states that he is doing very well in general.  He is pretty much back to his baseline at this point regarding LUTS and is noticing a gradual improvement in his energy/stamina.  He specifically denies dysuria, gross hematuria, excessive daytime frequency, incomplete bladder emptying or incontinence.  He continues to tolerate the ADT well with significant improvement in the frequency and intensity of the hot flashes and improved stamina.  He reports a healthy appetite and is maintaining his weight.  He denies any abdominal pain, nausea, vomiting, diarrhea or constipation.  Overall, he is quite pleased with his progress to  date.  ALLERGIES:  is allergic to atorvastatin, cephalexin, meloxicam, other, oxycodone-acetaminophen, pravastatin, and sulfa antibiotics.  Meds: Current Outpatient Medications  Medication Sig Dispense Refill   ALPRAZolam (XANAX) 0.5 MG tablet Take 0.5 mg by mouth at bedtime as needed for anxiety or sleep.      azaTHIOprine (IMURAN) 50 MG tablet Take 50 mg by mouth daily.     esomeprazole (NEXIUM) 40 MG capsule Take 1 capsule (40 mg total) by mouth 2 (two) times daily before a meal. 60 capsule 3   propranolol (INDERAL) 20 MG tablet Take 1 tablet (20 mg total) by mouth 2 (two) times daily. (Patient taking differently: Take 20 mg by mouth daily. For esophagus varices) 60 tablet 11   tamsulosin (FLOMAX) 0.4 MG CAPS capsule Take 1 capsule (0.4 mg total) by mouth daily. 30 capsule 5   traMADol (ULTRAM) 50 MG tablet Take 1-2 tablets (50-100 mg total) by mouth every 6 (six) hours as needed for moderate pain. 10 tablet 0   No current facility-administered medications for this encounter.    Physical Findings:  vitals were not taken for this visit.  Pain Assessment Pain Score: 0-No pain/10 Unable to assess due to telephone follow-up visit format.  Lab Findings: Lab Results  Component Value Date   WBC 5.4 05/30/2021   HGB 16.1 05/30/2021   HCT 45.9 05/30/2021   MCV 95.8 05/30/2021   PLT 200 05/30/2021  Radiographic Findings: No results found.  Impression/Plan: 1. 65 y.o. gentleman with stage T2b adenocarcinoma of the prostate with a Gleason's score of 4+4 and a PSA of 9.43.    He will continue to follow up with urology for ongoing PSA determinations and has an appointment scheduled with Dr. Louis Meckel on 09/18/21.  He continues to tolerate the ADT well and reports significant improvement in the hot flashes and gradual improvement in his energy/stamina.  He has not yet resumed his cycling routine but plans to ease back into this in the very near future.  He understands what to expect with  regards to PSA monitoring going forward. I will look forward to following his response to treatment via correspondence with urology, and would be happy to continue to participate in his care if clinically indicated. I talked to the patient about what to expect in the future, including his risk for erectile dysfunction and rectal bleeding. I encouraged him to call or return to the office if he has any questions regarding his previous radiation or possible radiation side effects. He was comfortable with this plan and will follow up as needed.     Nicholos Johns, PA-C

## 2021-09-14 ENCOUNTER — Ambulatory Visit (HOSPITAL_COMMUNITY)
Admission: RE | Admit: 2021-09-14 | Discharge: 2021-09-14 | Disposition: A | Discharge status: Home / Self Care | Payer: Medicare PPO | Source: Ambulatory Visit | Attending: Gastroenterology | Admitting: Gastroenterology

## 2021-09-14 ENCOUNTER — Other Ambulatory Visit: Payer: Self-pay

## 2021-09-14 ENCOUNTER — Other Ambulatory Visit: Payer: Self-pay | Admitting: Urology

## 2021-09-14 DIAGNOSIS — R932 Abnormal findings on diagnostic imaging of liver and biliary tract: Secondary | ICD-10-CM

## 2021-09-14 DIAGNOSIS — K7460 Unspecified cirrhosis of liver: Secondary | ICD-10-CM | POA: Diagnosis not present

## 2021-09-14 DIAGNOSIS — K8020 Calculus of gallbladder without cholecystitis without obstruction: Secondary | ICD-10-CM | POA: Diagnosis not present

## 2021-09-14 DIAGNOSIS — K754 Autoimmune hepatitis: Secondary | ICD-10-CM | POA: Diagnosis not present

## 2021-09-14 DIAGNOSIS — C61 Malignant neoplasm of prostate: Secondary | ICD-10-CM

## 2021-09-14 IMAGING — US US ABDOMEN LIMITED
1 series · 13 of 25 positions shown · non-contrast
Comparison: Ultrasound abdomen dated [DATE]; CT abdomen and
pelvis dated [DATE] 21

CLINICAL DATA: cirrhosis.

EXAM:
ULTRASOUND ABDOMEN LIMITED RIGHT UPPER QUADRANT

[Series 1: us abdomen limited · 13 of 53 slices shown]
[im 1/53]
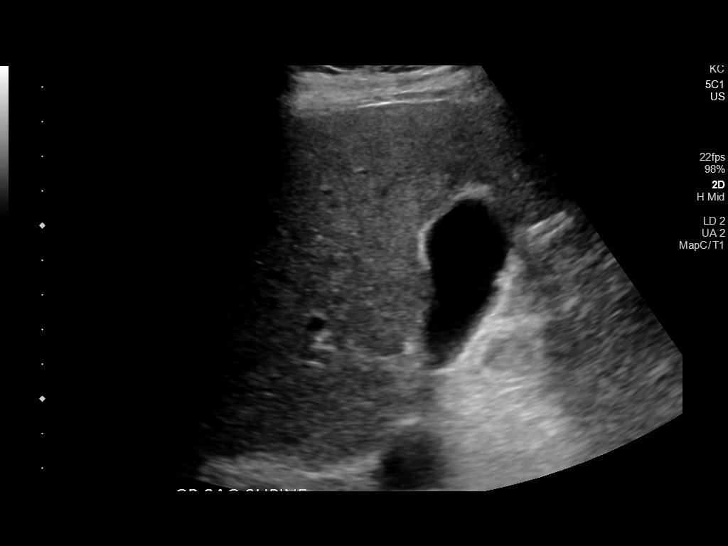
[im 5/53]
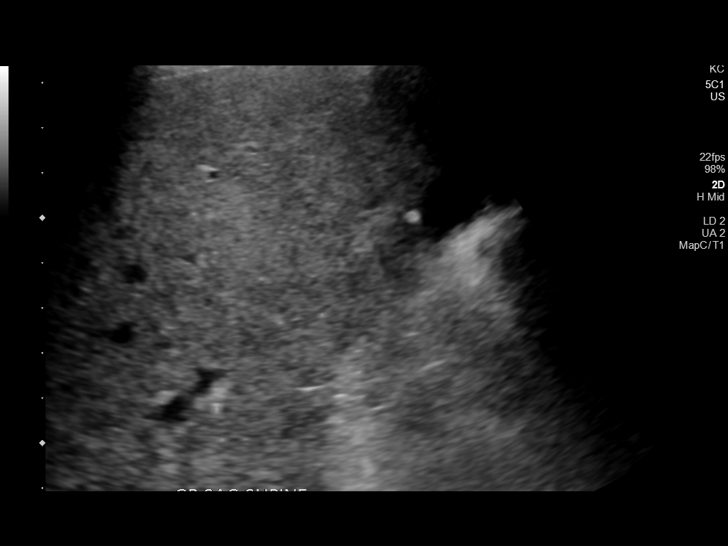
[im 9/53]
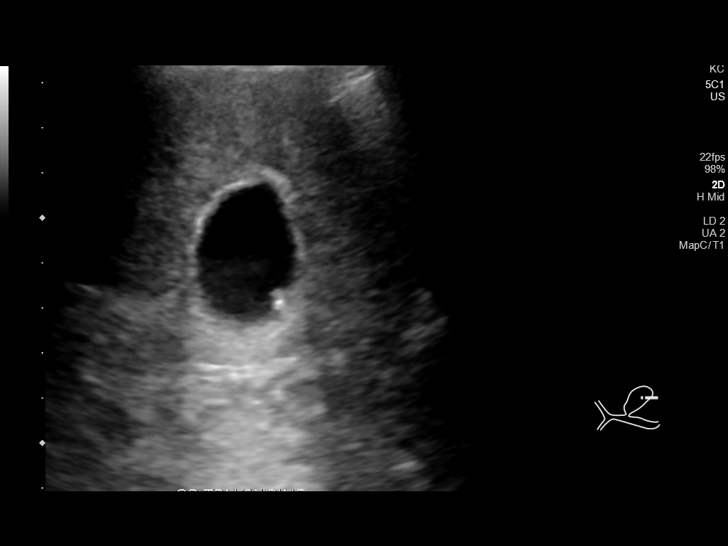
[im 14/53]
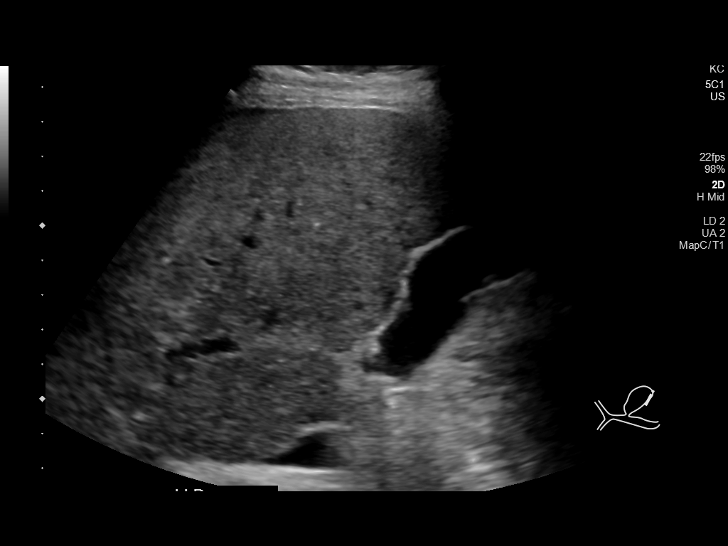
[im 18/53]
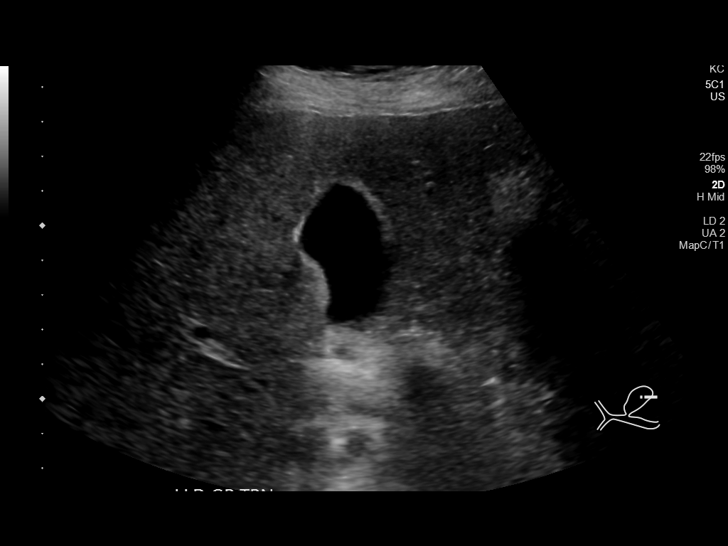
[im 22/53]
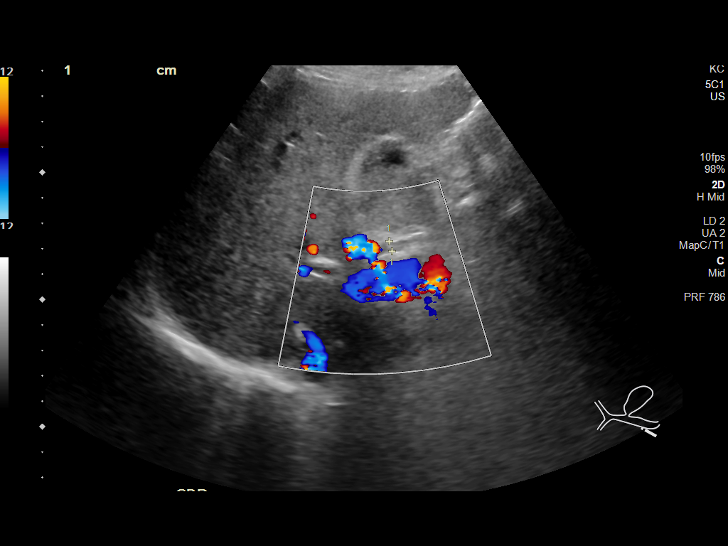
[im 27/53]
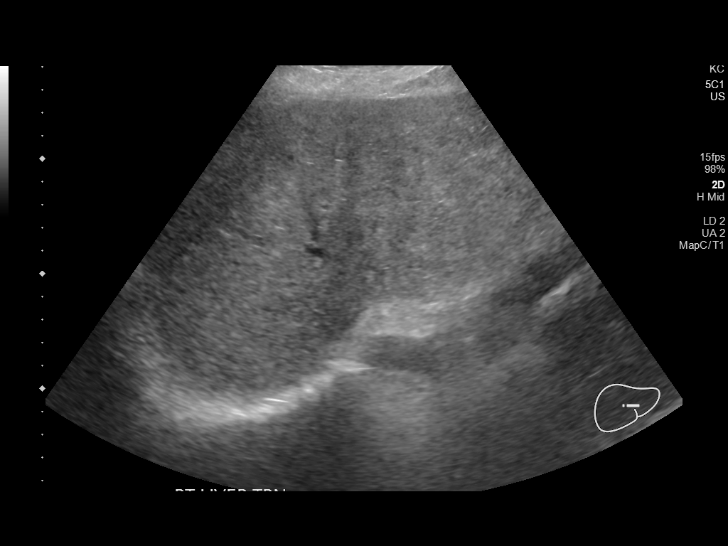
[im 31/53]
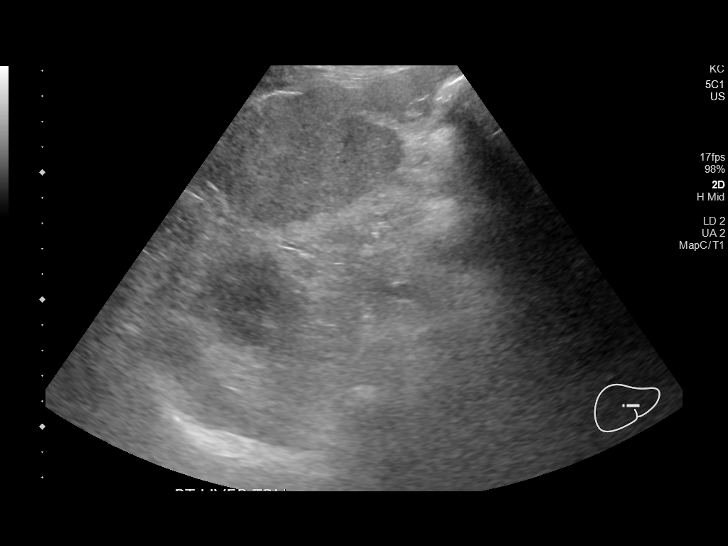
[im 35/53]
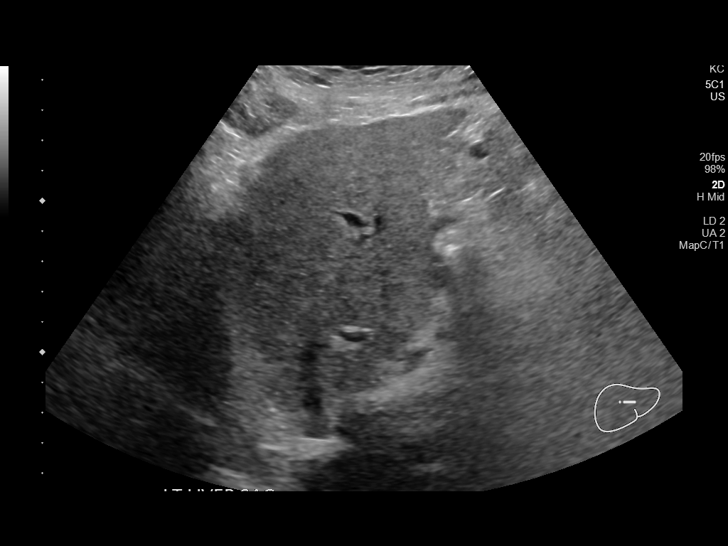
[im 40/53]
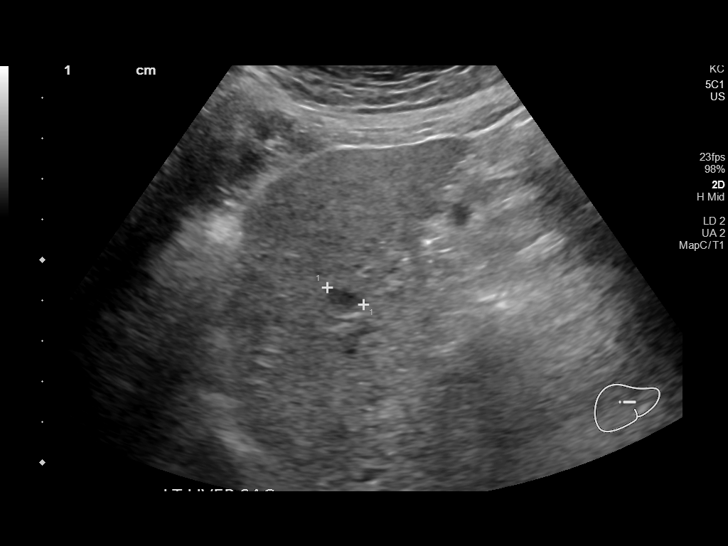
[im 44/53]
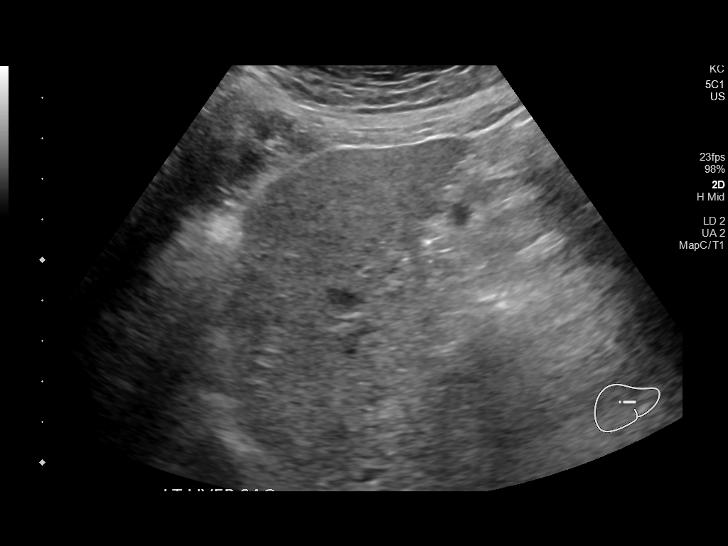
[im 48/53]
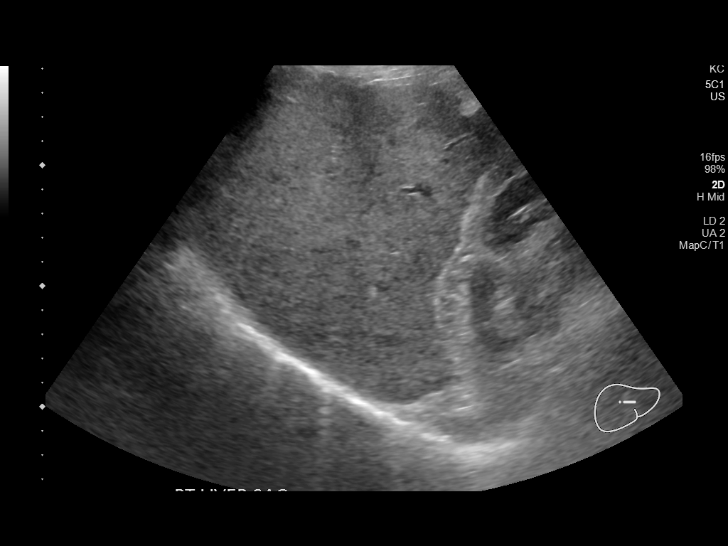
[im 53/53]
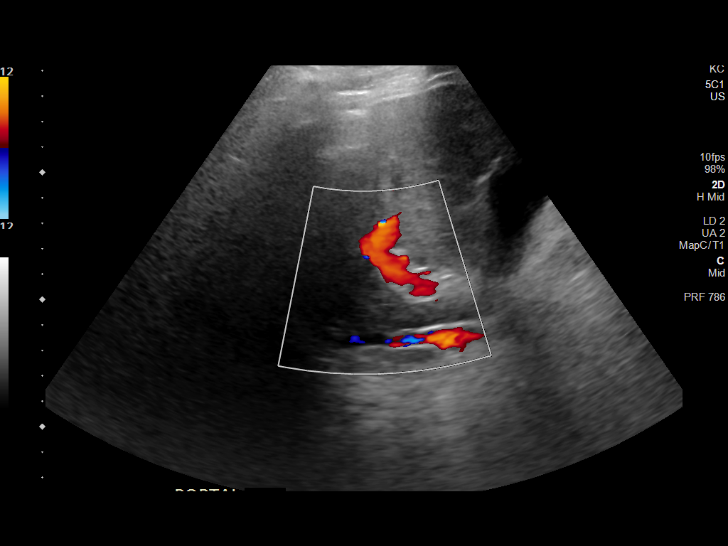

[13 of 25 positions shown; findings below may reference images not displayed]

FINDINGS: Gallbladder:

Gallbladder contains multiple polyps and a small stone. Largest
polyp measures 5 mm. Largest stone measures 5 mm. No gallbladder
wall thickening. Negative sonographic Murphy sign.

Common bile duct:

Diameter: 4

Liver:

Two lesions are identified in the liver. Hypoechoic ill-defined
lesion located in the left lobe of the liver measures 1.0 x 0.8 x
0.3 cm. Additional hypoechoic ill-defined lesion lesion is seen
closer to the dome which is not well visualized and measures
approximately 0.8 cm. Nodular liver contour with heterogeneous
echotexture. Portal vein is patent on color Doppler imaging with
normal direction of blood flow towards the liver.

Other: None.
IMPRESSION: Two indeterminate lesions are seen in the left lobe of the liver.
Recommend further evaluation with contrast-enhanced MRI.

Cirrhotic liver morphology without evidence of ascites.

Cholelithiasis.

Gallbladder polyps measuring up to 5 mm, no further imaging
evaluation or follow up necessary. Per consensus guidelines, this
requires no additional evaluation or specific follow-up. This
recommendation follows ACR consensus guidelines: White Paper of the
ACR Incidental findings Committee II on Gallbladder and Biliary
findings. [HOSPITAL] [I6]:;[DATE].

## 2021-09-18 ENCOUNTER — Other Ambulatory Visit: Payer: Self-pay

## 2021-09-18 DIAGNOSIS — Z5111 Encounter for antineoplastic chemotherapy: Secondary | ICD-10-CM | POA: Diagnosis not present

## 2021-09-18 DIAGNOSIS — K7460 Unspecified cirrhosis of liver: Secondary | ICD-10-CM

## 2021-09-18 DIAGNOSIS — K754 Autoimmune hepatitis: Secondary | ICD-10-CM

## 2021-09-18 DIAGNOSIS — N5201 Erectile dysfunction due to arterial insufficiency: Secondary | ICD-10-CM | POA: Diagnosis not present

## 2021-09-18 DIAGNOSIS — C61 Malignant neoplasm of prostate: Secondary | ICD-10-CM | POA: Diagnosis not present

## 2021-09-18 DIAGNOSIS — R932 Abnormal findings on diagnostic imaging of liver and biliary tract: Secondary | ICD-10-CM

## 2021-09-20 MED ORDER — DIAZEPAM 5 MG PO TABS: ORAL_TABLET | ORAL | 0 refills | Status: AC

## 2021-09-20 NOTE — Telephone Encounter (Signed)
RX for Valium called in to local pharmacy on file, gave verbal order to [facility].

## 2021-09-21 ENCOUNTER — Other Ambulatory Visit (INDEPENDENT_AMBULATORY_CARE_PROVIDER_SITE_OTHER): Payer: Medicare PPO

## 2021-09-21 DIAGNOSIS — K7460 Unspecified cirrhosis of liver: Secondary | ICD-10-CM | POA: Diagnosis not present

## 2021-09-21 DIAGNOSIS — R932 Abnormal findings on diagnostic imaging of liver and biliary tract: Secondary | ICD-10-CM

## 2021-09-21 DIAGNOSIS — K754 Autoimmune hepatitis: Secondary | ICD-10-CM | POA: Diagnosis not present

## 2021-09-21 LAB — PROTIME-INR
INR: 1.3 ratio — ABNORMAL HIGH (ref 0.8–1.0)
Prothrombin Time: 13.8 s — ABNORMAL HIGH (ref 9.6–13.1)

## 2021-09-21 LAB — COMPREHENSIVE METABOLIC PANEL
ALT: 24 U/L (ref 0–53)
AST: 50 U/L — ABNORMAL HIGH (ref 0–37)
Albumin: 3.8 g/dL (ref 3.5–5.2)
Alkaline Phosphatase: 103 U/L (ref 39–117)
BUN: 13 mg/dL (ref 6–23)
CO2: 30 mEq/L (ref 19–32)
Calcium: 9.4 mg/dL (ref 8.4–10.5)
Chloride: 106 mEq/L (ref 96–112)
Creatinine, Ser: 0.73 mg/dL (ref 0.40–1.50)
GFR: 94.89 mL/min (ref >60.00)
Glucose, Bld: 81 mg/dL (ref 70–99)
Potassium: 4.2 mEq/L (ref 3.5–5.1)
Sodium: 142 mEq/L (ref 135–145)
Total Bilirubin: 1.3 mg/dL — ABNORMAL HIGH (ref 0.2–1.2)
Total Protein: 7.1 g/dL (ref 6.0–8.3)

## 2021-09-22 LAB — AFP TUMOR MARKER: AFP-Tumor Marker: 4.6 ng/mL (ref <6.1)

## 2021-09-25 DIAGNOSIS — K7469 Other cirrhosis of liver: Secondary | ICD-10-CM | POA: Diagnosis not present

## 2021-09-25 DIAGNOSIS — K754 Autoimmune hepatitis: Secondary | ICD-10-CM | POA: Diagnosis not present

## 2021-09-25 DIAGNOSIS — D376 Neoplasm of uncertain behavior of liver, gallbladder and bile ducts: Secondary | ICD-10-CM | POA: Diagnosis not present

## 2021-09-29 ENCOUNTER — Ambulatory Visit (HOSPITAL_COMMUNITY)
Admission: RE | Admit: 2021-09-29 | Discharge: 2021-09-29 | Disposition: A | Discharge status: Home / Self Care | Payer: Medicare PPO | Source: Ambulatory Visit | Attending: Gastroenterology | Admitting: Gastroenterology

## 2021-09-29 ENCOUNTER — Other Ambulatory Visit: Payer: Self-pay

## 2021-09-29 DIAGNOSIS — R932 Abnormal findings on diagnostic imaging of liver and biliary tract: Secondary | ICD-10-CM | POA: Diagnosis not present

## 2021-09-29 DIAGNOSIS — K754 Autoimmune hepatitis: Secondary | ICD-10-CM

## 2021-09-29 DIAGNOSIS — D1800 Hemangioma unspecified site: Secondary | ICD-10-CM | POA: Diagnosis not present

## 2021-09-29 DIAGNOSIS — K449 Diaphragmatic hernia without obstruction or gangrene: Secondary | ICD-10-CM | POA: Diagnosis not present

## 2021-09-29 DIAGNOSIS — K7460 Unspecified cirrhosis of liver: Secondary | ICD-10-CM

## 2021-09-29 IMAGING — MR MR ABDOMEN WO/W CM
18 of 19 series · 47 of 48 positions shown · IV contrast (8ML GADAVIST)
Comparison: Ultrasound [DATE] and CT abdomen [DATE]

CLINICAL DATA: Cirrhosis. Sub-centimeter liver lesions on
ultrasound.

EXAM:
MRI ABDOMEN WITHOUT AND WITH CONTRAST
TECHNIQUE: Multiplanar multisequence MR imaging of the abdomen was performed
both before and after the administration of intravenous contrast.
CONTRAST:  8mL GADAVIST GADOBUTROL 1 MMOL/ML IV SOLN

[Series 2: DWI · axial · 6.0mm · 1.49mm/px · z∈[-134,+118]mm · 4 of 72 slices shown (1 of 2)]
[im 1/72]
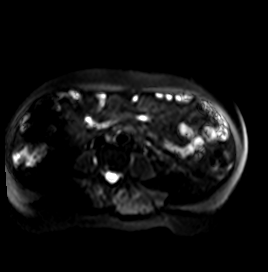
[im 24/72]
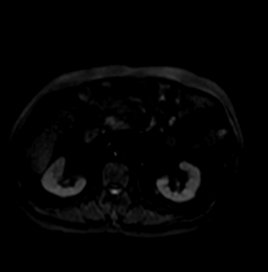
[im 48/72]
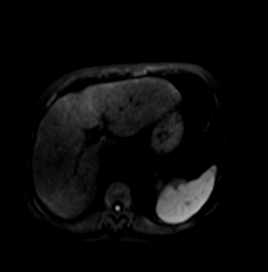
[im 72/72]
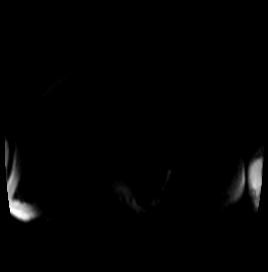

[Series 3: DWI · axial · 6.0mm · 1.49mm/px · z∈[-134,+118]mm · 2 of 36 slices shown (2 of 2)]
[im 1/36]
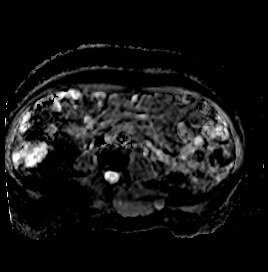
[im 36/36]
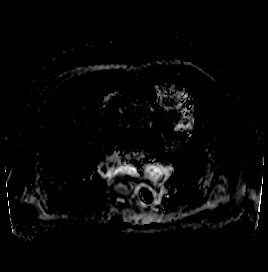

[Series 4: T2 fat-sat · axial · 6.0mm · 1.25mm/px · 1 of 36 slices shown]
[im 1/36]
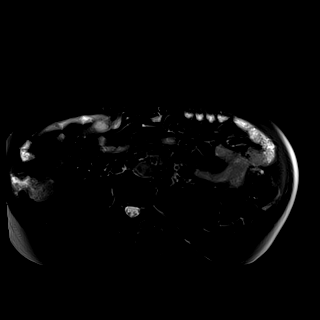

[Series 7: T2 · coronal · 6.0mm · 1.56mm/px · 1 of 34 slices shown (1 of 2)]
[im 1/34]
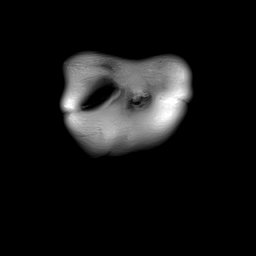

[Series 8: T1 · axial · 3.0mm · 1.25mm/px · z∈[-143,+94]mm · 3 of 80 slices shown (1 of 2)]
[im 1/80]
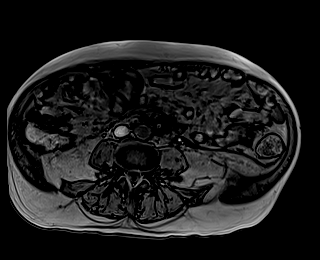
[im 40/80]
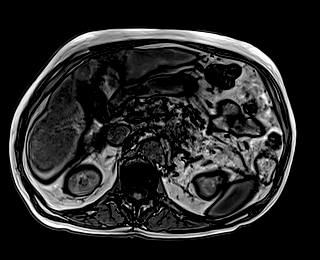
[im 80/80]
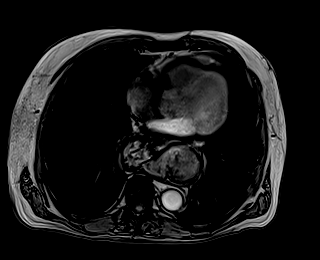

[Series 9: T1 · axial · 3.0mm · 1.25mm/px · z∈[-143,+94]mm · 3 of 80 slices shown (2 of 2)]
[im 1/80]
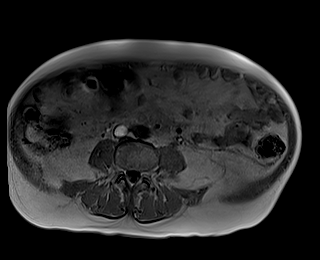
[im 40/80]
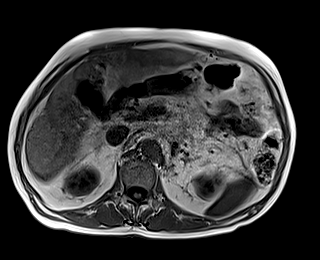
[im 80/80]
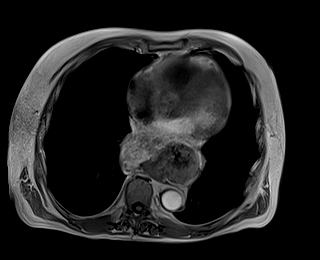

[Series 10: bSSFP · axial · 4.0mm · 0.84mm/px · z∈[-139,+89]mm · 2 of 58 slices shown]
[im 1/58]
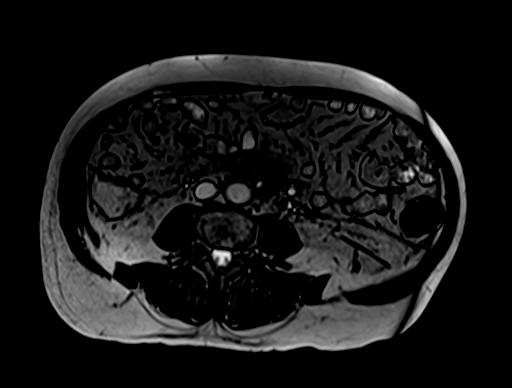
[im 58/58]
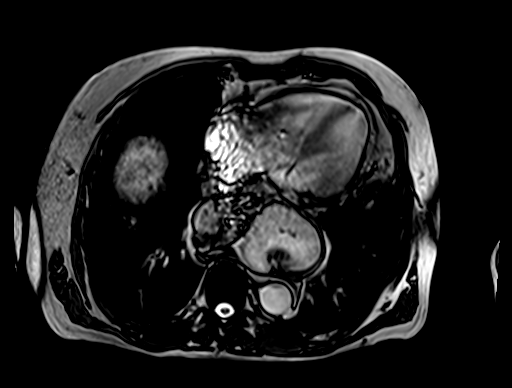

[Series 12: T1 dynamic · axial · 3.0mm · 1.25mm/px · z∈[-156,+106]mm · 3 of 88 slices shown (1 of 10)]
[im 1/88]
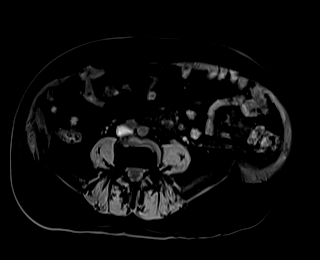
[im 44/88]
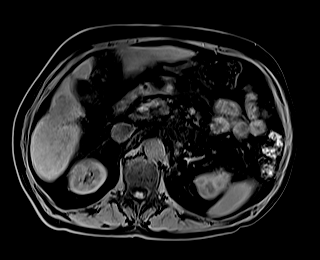
[im 88/88]
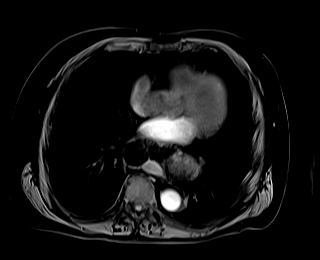

[Series 16: T1 dynamic · axial · 3.0mm · 1.25mm/px · z∈[-156,+106]mm · 3 of 88 slices shown (2 of 10)]
[im 1/88]
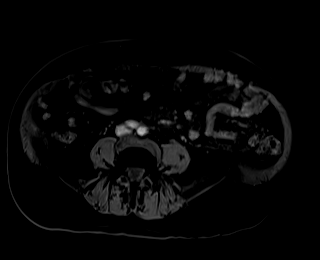
[im 44/88]
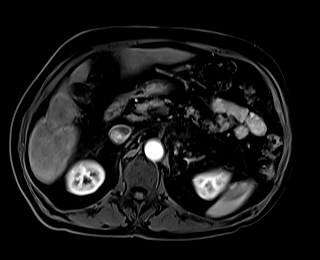
[im 88/88]
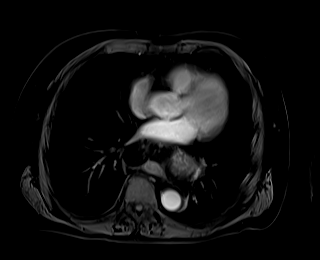

[Series 17: T1 dynamic · axial · 3.0mm · 1.25mm/px · z∈[-156,+106]mm · 3 of 88 slices shown (3 of 10)]
[im 1/88]
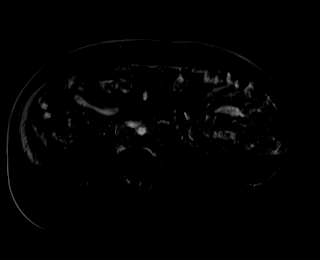
[im 44/88]
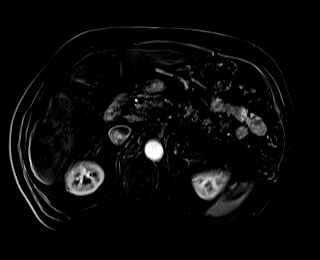
[im 88/88]
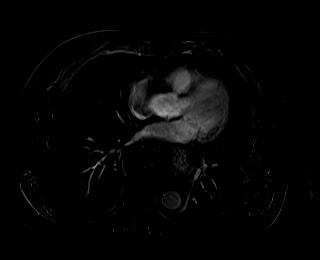

[Series 20: T1 dynamic · axial · 3.0mm · 1.25mm/px · z∈[-156,+106]mm · 3 of 88 slices shown (4 of 10)]
[im 1/88]
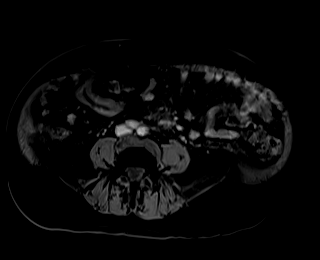
[im 44/88]
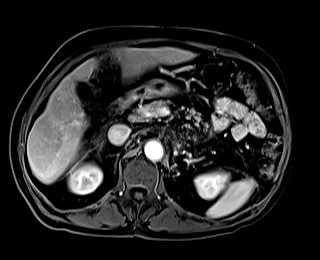
[im 88/88]
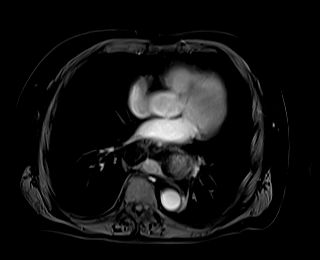

[Series 21: T1 dynamic · axial · 3.0mm · 1.25mm/px · z∈[-156,+106]mm · 3 of 88 slices shown (5 of 10)]
[im 1/88]
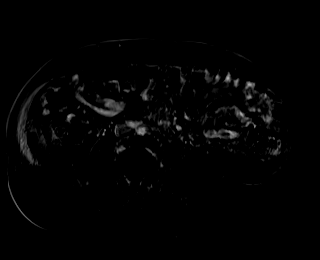
[im 44/88]
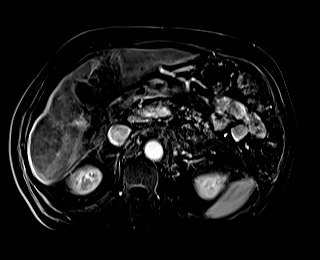
[im 88/88]
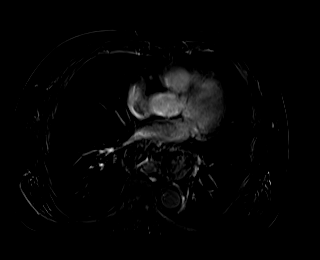

[Series 24: T1 dynamic · axial · 3.0mm · 1.25mm/px · z∈[-156,+106]mm · 3 of 88 slices shown (6 of 10)]
[im 1/88]
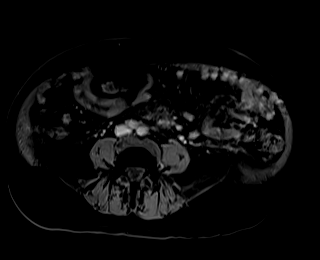
[im 44/88]
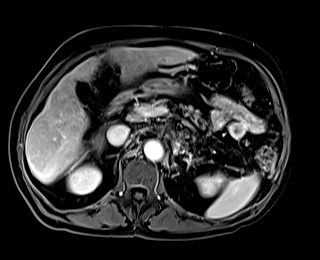
[im 88/88]
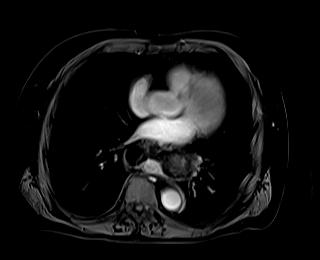

[Series 25: T1 dynamic · axial · 3.0mm · 1.25mm/px · z∈[-156,+106]mm · 3 of 88 slices shown (7 of 10)]
[im 1/88]
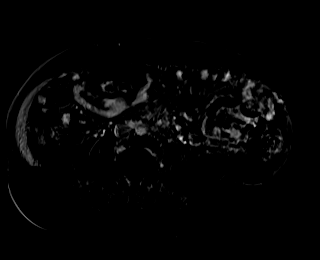
[im 44/88]
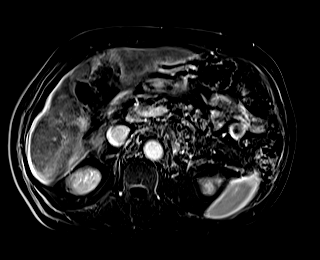
[im 88/88]
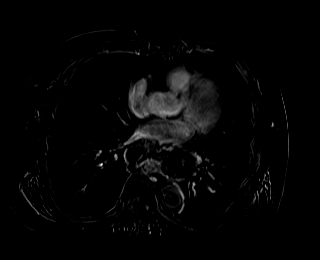

[Series 27: T1 dynamic · coronal · 3.0mm · 1.41mm/px · 3 of 72 slices shown (8 of 10)]
[im 1/72]
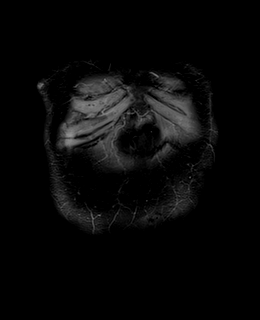
[im 36/72]
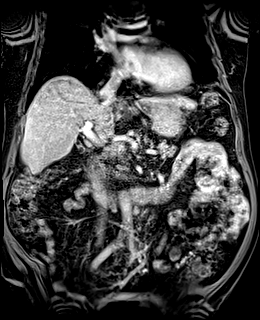
[im 72/72]
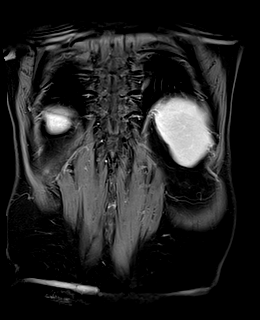

[Series 28: T2 · axial · 6.0mm · 1.56mm/px · 1 of 30 slices shown (2 of 2)]
[im 1/30]
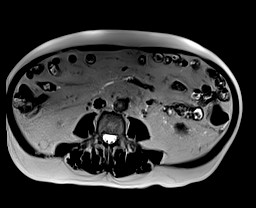

[Series 31: T1 dynamic · axial · 3.0mm · 1.25mm/px · z∈[-156,+106]mm · 3 of 88 slices shown (9 of 10)]
[im 1/88]
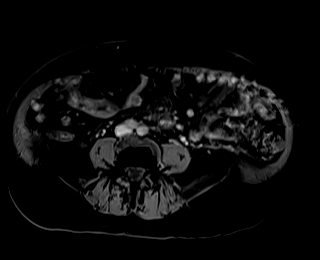
[im 44/88]
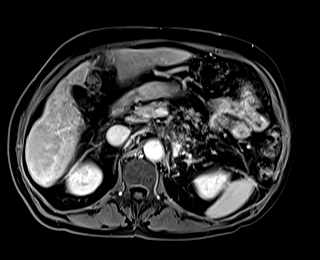
[im 88/88]
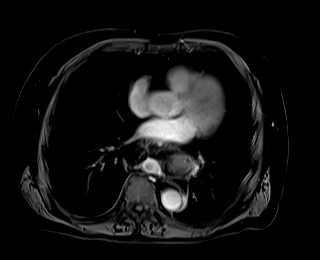

[Series 32: T1 dynamic · axial · 3.0mm · 1.25mm/px · z∈[-156,+106]mm · 3 of 88 slices shown (10 of 10)]
[im 1/88]
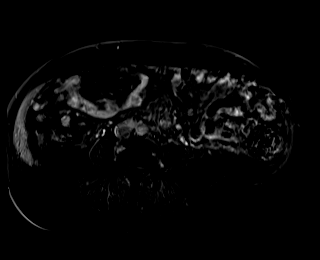
[im 44/88]
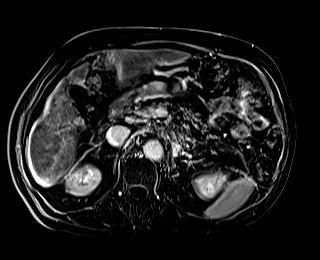
[im 88/88]
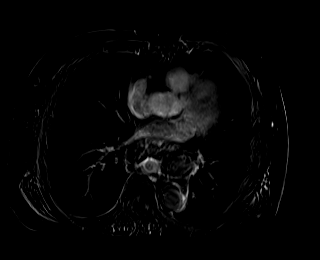

[47 of 48 positions shown; findings below may reference images not displayed]

FINDINGS: Despite efforts by the technologist and patient, motion artifact is
present on today's exam and could not be eliminated. This reduces
exam sensitivity and specificity.

Lower chest: Moderate-sized hiatal hernia and accompanying
vascularity in adipose tissue. This is similar to that depicted on
the CT from [DATE]. Borderline cardiomegaly.

Hepatobiliary: Hepatic contour compatible with cirrhosis.

No focal abnormal arterial phase enhancement is identified to
suggest hepatocellular carcinoma. No specific lesions are identified
to correlate with the small sub-centimeter hypo echogenicities
described in the left hepatic lobe no substantial restriction of
diffusion the small gallstone seen on the ultrasound is poorly
corroborated on MRI although may be dependent in the gallbladder on
image 17 series 4. No biliary dilatation. Patent portal vein and
splenic vein.

Pancreas:  Unremarkable

Spleen:  Unremarkable

Adrenals/Urinary Tract:  Unremarkable

Stomach/Bowel: Moderate hiatal hernia. No dilated bowel or specific
abnormality of the visualized bowel identified.

Vascular/Lymphatic: Mild splenorenal shunting with collateral
vessels around the stomach.

Other: There is hazy stranding primarily at the root of the
mesentery but also extending into the central mesentery for example
on image 30 of series 4. The appearance is nonspecific but can be
seen in sclerosing mesenteritis, cirrhosis, inflammation, lymphoma,
or can be idiopathic.

Musculoskeletal: Hemangioma eccentric to the right in the T12
vertebral body.
IMPRESSION: 1. Hepatic cirrhosis is present without worrisome mass or lesion. No
abnormal arterial phase enhancement. No definite correlate for the
subcentimeter hypoechogenicity seen at ultrasound.
2. Collateral vessels around the stomach with some minimal
splenorenal shunting. There is laxity of the hiatus with a
moderate-sized hiatal hernia containing stomach and adipose tissue.
3. Central mesenteric stranding may be mildly increased from prior
exams. The appearance is nonspecific but certainly could be related
to the patient's cirrhosis.

## 2021-09-29 MED ORDER — GADOBUTROL 1 MMOL/ML IV SOLN
8.0000 mL | Freq: Once | INTRAVENOUS | Status: AC | PRN
  Administered 2021-09-29: 8 mL via INTRAVENOUS

## 2021-10-03 ENCOUNTER — Telehealth: Payer: Self-pay | Admitting: *Deleted

## 2021-10-04 ENCOUNTER — Telehealth: Payer: Self-pay | Admitting: *Deleted

## 2021-10-04 NOTE — Telephone Encounter (Signed)
Scheduled per 10/4 sch msg, msg was left with pt

## 2021-11-02 ENCOUNTER — Inpatient Hospital Stay: Payer: Medicare PPO | Attending: Adult Health | Admitting: *Deleted

## 2021-11-02 ENCOUNTER — Encounter: Payer: Self-pay | Admitting: *Deleted

## 2021-11-02 DIAGNOSIS — C61 Malignant neoplasm of prostate: Secondary | ICD-10-CM

## 2021-11-02 NOTE — Progress Notes (Signed)
2 Identifiers were used for verification purposes of patient for this visit. SCP reviewed and completed. SDOH assessed and completed. No barriers and no needs noted at this visit. Pt states urine flow is back to normal and he is sleeping through the night with no bathroom visits in the middle of the night. Pt has more frequent BM's but they are normal soft-formed stools. Pt continues to receive Eligard q 6 mos. He does have some hot flashes at nights. Patient has a new mole to eyebrow noticed 6 mos ago. This nurse advised him to make an appt with his established dermatologist to evaluate. Pt is up to date with colonoscopy and has had a flu vaccine for 2022. He will be receiving Moderna booster in two weeks.Medications and allergies were reviewed also.

## 2021-12-21 DIAGNOSIS — K754 Autoimmune hepatitis: Secondary | ICD-10-CM | POA: Diagnosis not present

## 2021-12-21 DIAGNOSIS — D376 Neoplasm of uncertain behavior of liver, gallbladder and bile ducts: Secondary | ICD-10-CM | POA: Diagnosis not present

## 2021-12-21 DIAGNOSIS — C61 Malignant neoplasm of prostate: Secondary | ICD-10-CM | POA: Diagnosis not present

## 2021-12-21 DIAGNOSIS — Z8546 Personal history of malignant neoplasm of prostate: Secondary | ICD-10-CM | POA: Diagnosis not present

## 2021-12-21 DIAGNOSIS — I8510 Secondary esophageal varices without bleeding: Secondary | ICD-10-CM | POA: Diagnosis not present

## 2021-12-21 DIAGNOSIS — K7469 Other cirrhosis of liver: Secondary | ICD-10-CM | POA: Diagnosis not present

## 2022-01-11 ENCOUNTER — Other Ambulatory Visit: Payer: Self-pay | Admitting: Gastroenterology

## 2022-02-14 DIAGNOSIS — C61 Malignant neoplasm of prostate: Secondary | ICD-10-CM | POA: Diagnosis not present

## 2022-02-14 DIAGNOSIS — E042 Nontoxic multinodular goiter: Secondary | ICD-10-CM | POA: Diagnosis not present

## 2022-02-14 DIAGNOSIS — I700 Atherosclerosis of aorta: Secondary | ICD-10-CM | POA: Diagnosis not present

## 2022-02-14 DIAGNOSIS — K7460 Unspecified cirrhosis of liver: Secondary | ICD-10-CM | POA: Diagnosis not present

## 2022-02-14 DIAGNOSIS — K754 Autoimmune hepatitis: Secondary | ICD-10-CM | POA: Diagnosis not present

## 2022-02-14 DIAGNOSIS — R160 Hepatomegaly, not elsewhere classified: Secondary | ICD-10-CM | POA: Diagnosis not present

## 2022-02-14 DIAGNOSIS — E7800 Pure hypercholesterolemia, unspecified: Secondary | ICD-10-CM | POA: Diagnosis not present

## 2022-02-14 DIAGNOSIS — K219 Gastro-esophageal reflux disease without esophagitis: Secondary | ICD-10-CM | POA: Diagnosis not present

## 2022-02-14 DIAGNOSIS — K766 Portal hypertension: Secondary | ICD-10-CM | POA: Diagnosis not present

## 2022-02-14 DIAGNOSIS — K8020 Calculus of gallbladder without cholecystitis without obstruction: Secondary | ICD-10-CM | POA: Diagnosis not present

## 2022-02-15 ENCOUNTER — Other Ambulatory Visit: Payer: Self-pay | Admitting: Nurse Practitioner

## 2022-02-15 DIAGNOSIS — K7469 Other cirrhosis of liver: Secondary | ICD-10-CM

## 2022-02-15 DIAGNOSIS — D376 Neoplasm of uncertain behavior of liver, gallbladder and bile ducts: Secondary | ICD-10-CM

## 2022-02-22 ENCOUNTER — Ambulatory Visit
Admission: RE | Admit: 2022-02-22 | Discharge: 2022-02-22 | Disposition: A | Discharge status: Home / Self Care | Payer: Medicare PPO | Source: Ambulatory Visit | Attending: Nurse Practitioner | Admitting: Nurse Practitioner

## 2022-02-22 DIAGNOSIS — K7469 Other cirrhosis of liver: Secondary | ICD-10-CM

## 2022-02-22 DIAGNOSIS — K8020 Calculus of gallbladder without cholecystitis without obstruction: Secondary | ICD-10-CM | POA: Diagnosis not present

## 2022-02-22 DIAGNOSIS — K769 Liver disease, unspecified: Secondary | ICD-10-CM | POA: Diagnosis not present

## 2022-02-22 DIAGNOSIS — D376 Neoplasm of uncertain behavior of liver, gallbladder and bile ducts: Secondary | ICD-10-CM

## 2022-02-22 DIAGNOSIS — R188 Other ascites: Secondary | ICD-10-CM | POA: Diagnosis not present

## 2022-02-22 DIAGNOSIS — K7460 Unspecified cirrhosis of liver: Secondary | ICD-10-CM | POA: Diagnosis not present

## 2022-02-22 IMAGING — US US ABDOMEN LIMITED
1 series · 13 of 25 positions shown · non-contrast
Comparison: Right upper quadrant abdomen ultrasound dated
[DATE] abdomen MRI dated [DATE].

CLINICAL DATA: Cirrhosis of the liver. Follow-up 2 small oval,
hypoechoic liver lesions on previous ultrasound with no subsequent
MR correlate other than expected changes of cirrhosis of the liver.

EXAM:
ULTRASOUND ABDOMEN LIMITED RIGHT UPPER QUADRANT

[Series 1: us abdomen limited · 0.22mm/px · 75 acquisitions, 13 frames shown]
[im 1/75]
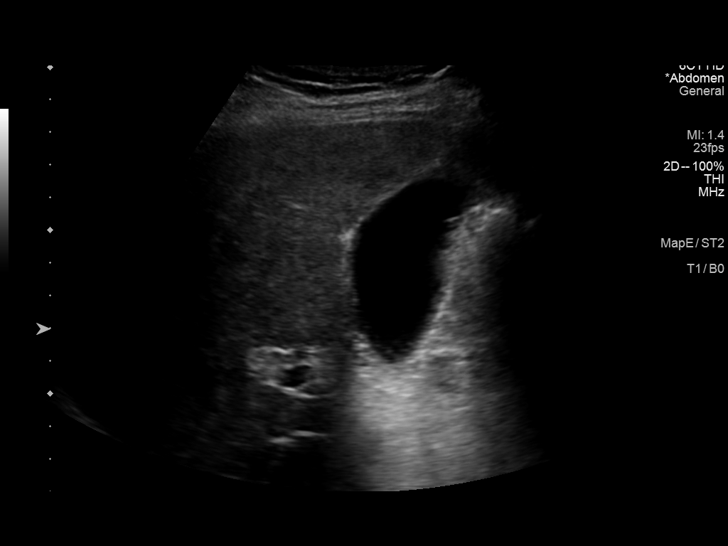
[im 7/75]
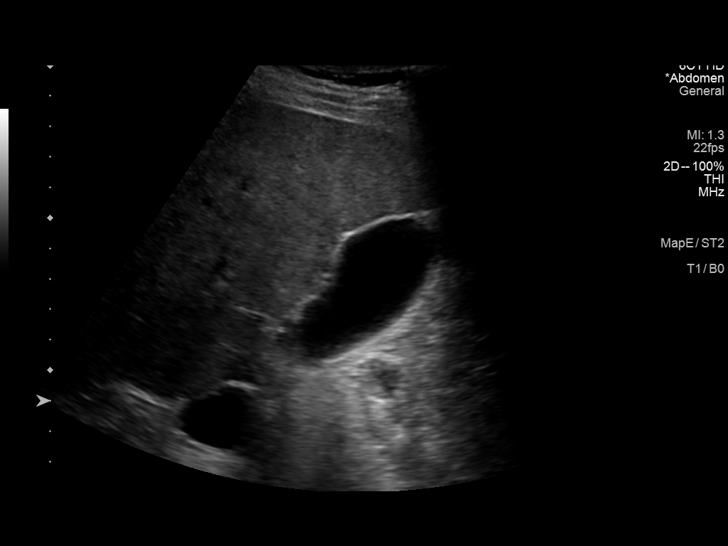
[im 13/75]
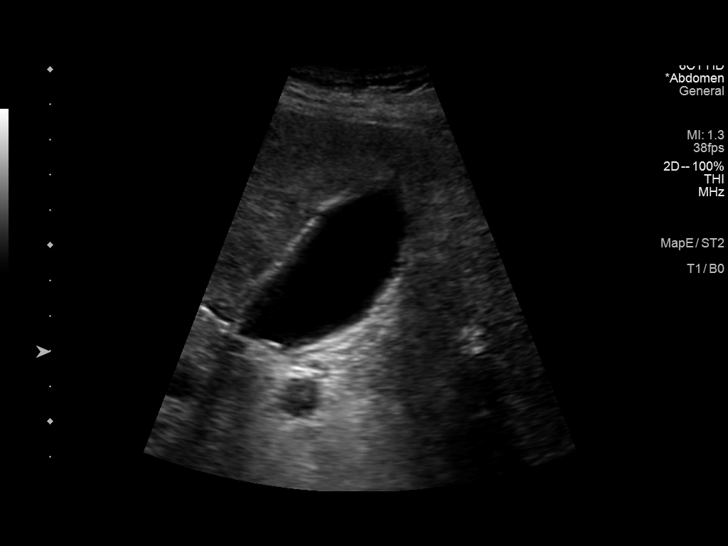
[im 19/75]
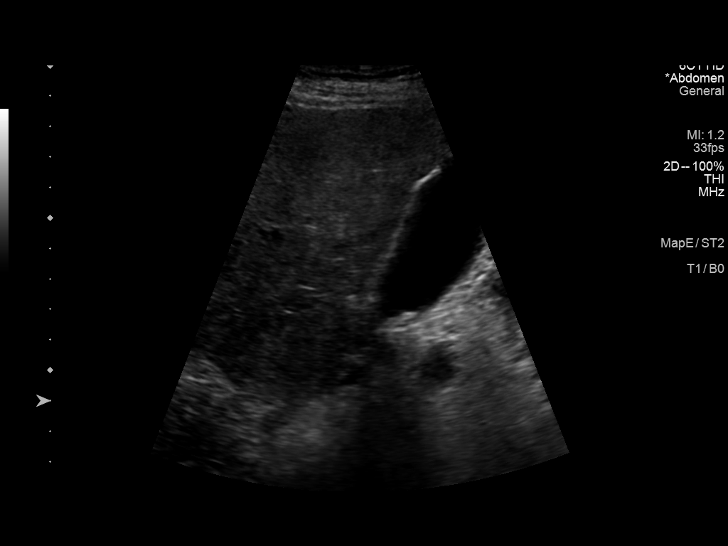
[im 25/75]
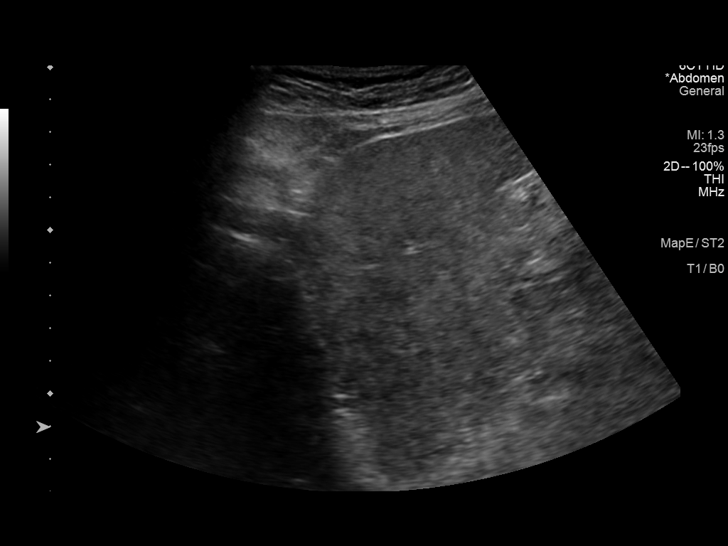
[im 31/75]
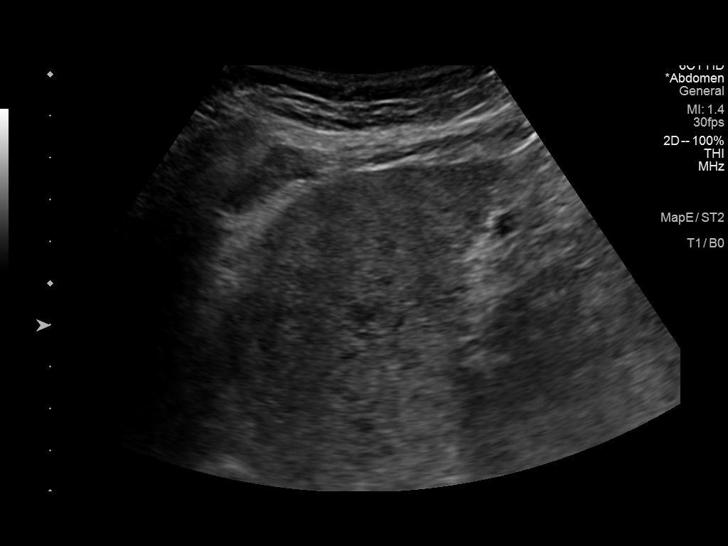
[im 38/75]
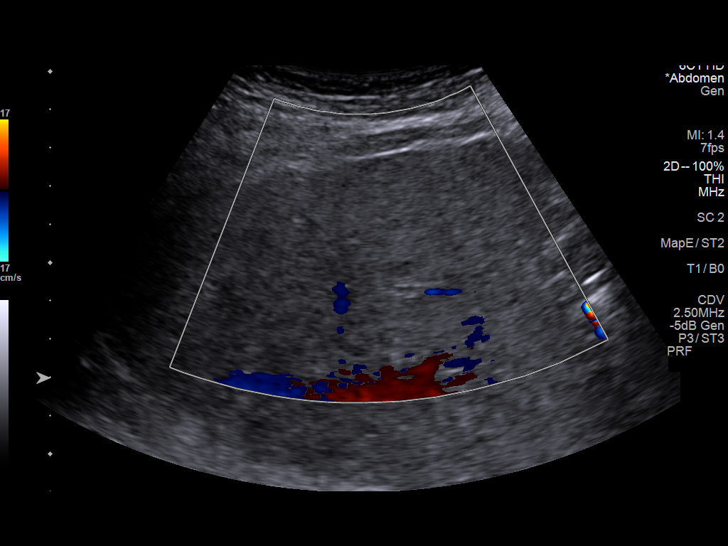
[im 44/75]
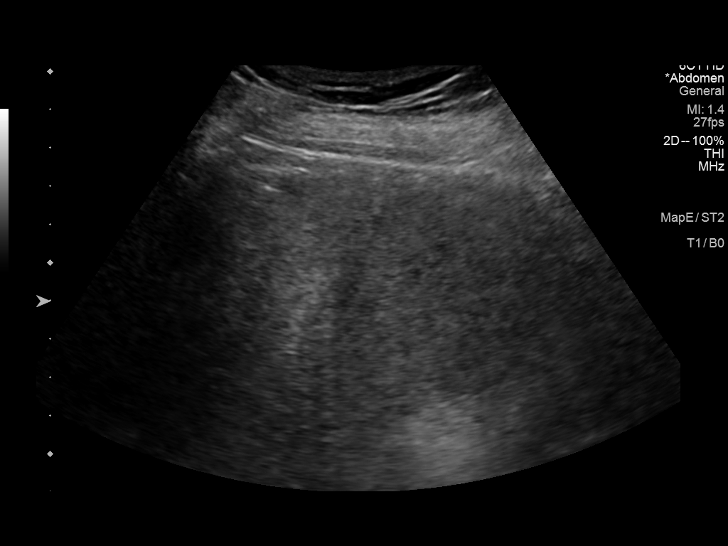
[im 50/75]
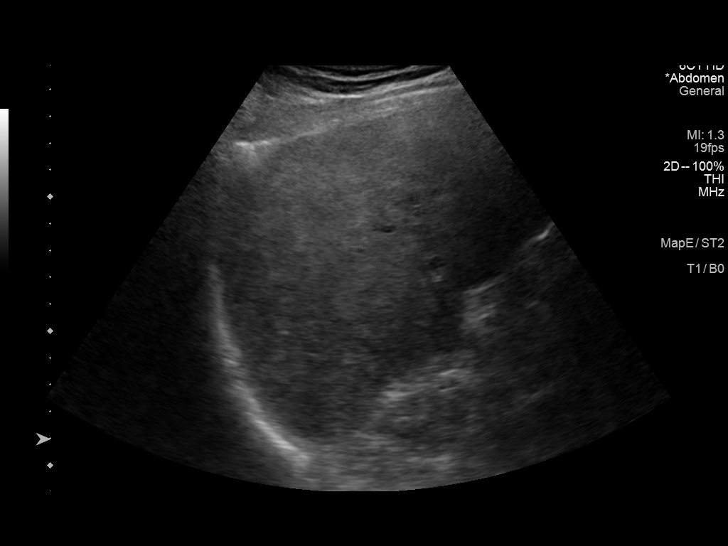
[im 56/75]
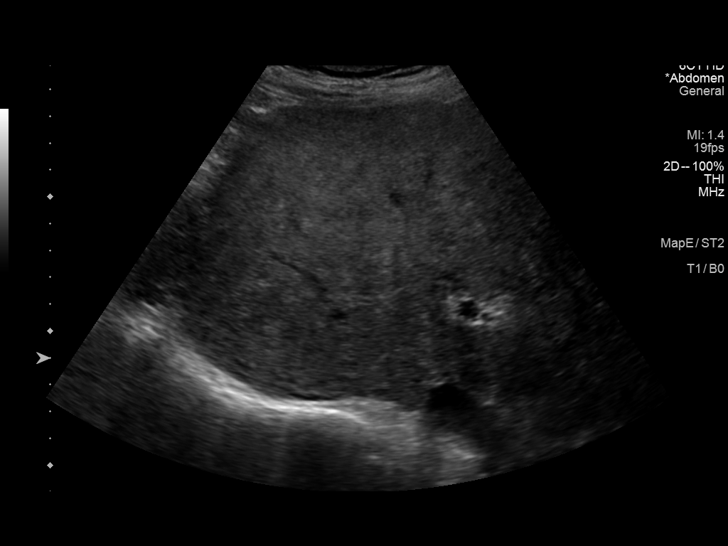
[im 62/75]
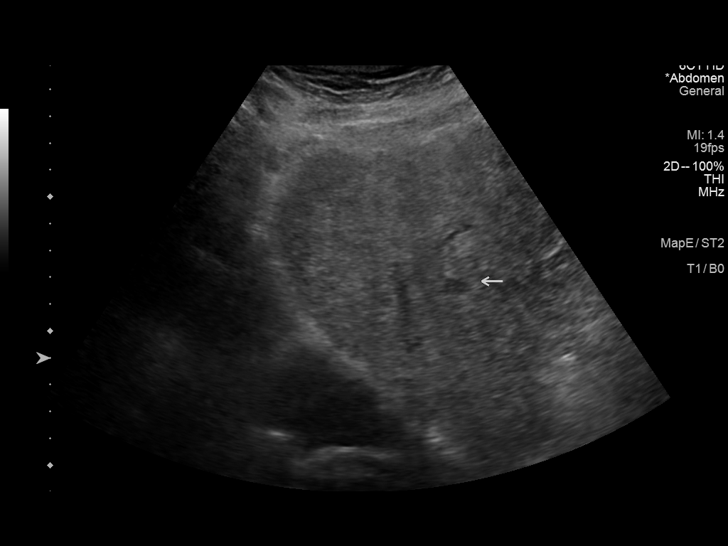
[im 68/75]
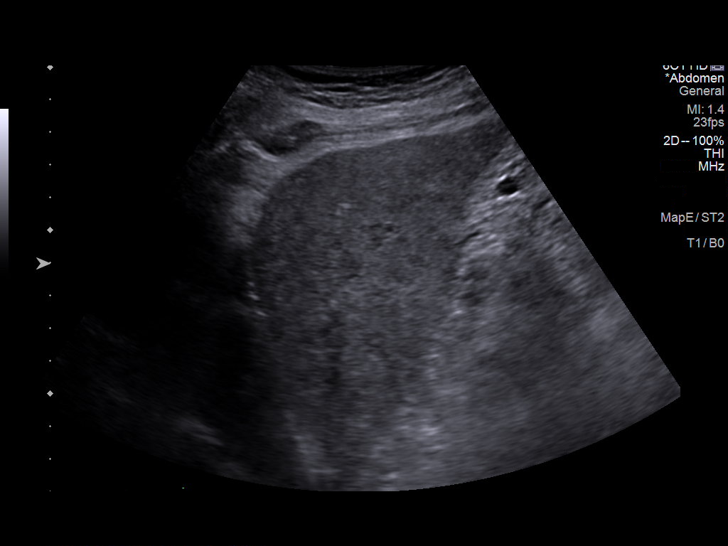
[im 75/75]
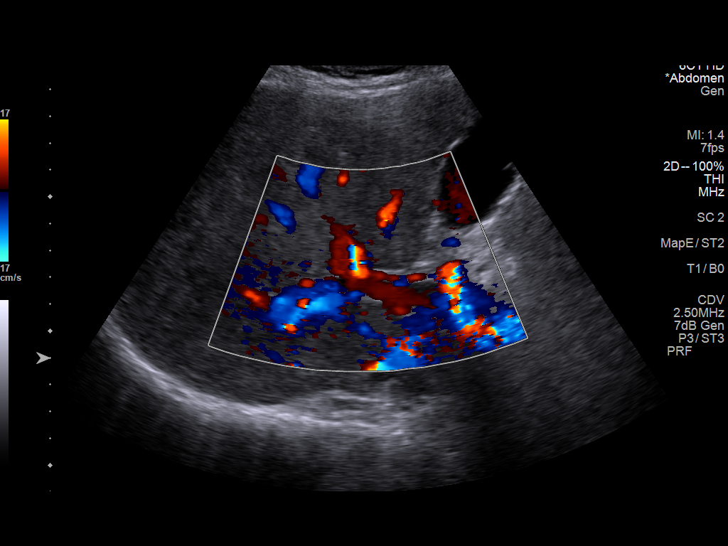

[13 of 25 positions shown; findings below may reference images not displayed]

FINDINGS: Gallbladder:

Small gallstones are again demonstrated in the gallbladder,
measuring up to 5 mm in maximum diameter each. No gallbladder wall
thickening or pericholecystic fluid. No sonographic Murphy sign.

Common bile duct:

Diameter: 2.6 mm

Liver:

Mildly echogenic and diffusely heterogeneous with mildly nodular
contours. 1.1 x 0.9 x 0.6 cm oval, hypoechoic area within the
medial, inferior left lobe of the liver. This measured 0.9 x 0.7 x
0.5 cm on [DATE]. A previously demonstrated smaller oval,
hypoechoic area in the left lobe of the liver is not visualized
today. Portal vein is patent on color Doppler imaging with normal
direction of blood flow towards the liver.

Other: Small amount of free peritoneal fluid adjacent to the liver.
IMPRESSION: 1. Slight increase in size of an oval hypoechoic area in the left
lobe of the liver, currently measuring 1.1 x 0.9 x 0.6 cm. No
previous corresponding MRI abnormality.
2. The previously seen 2nd smaller hypoechoic area in the left lobe
of the liver is not visualized today.
3. Stable changes of cirrhosis of the liver with no new masses seen.
4. Small amount of ascites.

## 2022-04-02 ENCOUNTER — Telehealth: Payer: Self-pay

## 2022-04-02 DIAGNOSIS — C61 Malignant neoplasm of prostate: Secondary | ICD-10-CM | POA: Diagnosis not present

## 2022-04-02 DIAGNOSIS — N5201 Erectile dysfunction due to arterial insufficiency: Secondary | ICD-10-CM | POA: Diagnosis not present

## 2022-04-02 NOTE — Telephone Encounter (Signed)
Patient follow up with Roosevelt Locks, NP of [facility]. Had repeat US in 01/2022. ?

## 2022-04-02 NOTE — Telephone Encounter (Signed)
-----   Message from Yevette Edwards, RN sent at 10/02/2021 11:10 AM EDT ----- ?Regarding: [place] ?RUQ [place] - autoimmune hepatitis, cirrhosis, HCC screening ? ?

## 2022-05-07 DIAGNOSIS — F411 Generalized anxiety disorder: Secondary | ICD-10-CM | POA: Diagnosis not present

## 2022-05-07 DIAGNOSIS — F331 Major depressive disorder, recurrent, moderate: Secondary | ICD-10-CM | POA: Diagnosis not present

## 2022-07-16 DIAGNOSIS — F411 Generalized anxiety disorder: Secondary | ICD-10-CM | POA: Diagnosis not present

## 2022-07-16 DIAGNOSIS — F331 Major depressive disorder, recurrent, moderate: Secondary | ICD-10-CM | POA: Diagnosis not present

## 2022-08-20 DIAGNOSIS — E7800 Pure hypercholesterolemia, unspecified: Secondary | ICD-10-CM | POA: Diagnosis not present

## 2022-08-20 DIAGNOSIS — K766 Portal hypertension: Secondary | ICD-10-CM | POA: Diagnosis not present

## 2022-08-20 DIAGNOSIS — Z0000 Encounter for general adult medical examination without abnormal findings: Secondary | ICD-10-CM | POA: Diagnosis not present

## 2022-08-20 DIAGNOSIS — E042 Nontoxic multinodular goiter: Secondary | ICD-10-CM | POA: Diagnosis not present

## 2022-08-20 DIAGNOSIS — I700 Atherosclerosis of aorta: Secondary | ICD-10-CM | POA: Diagnosis not present

## 2022-08-20 DIAGNOSIS — K754 Autoimmune hepatitis: Secondary | ICD-10-CM | POA: Diagnosis not present

## 2022-08-20 DIAGNOSIS — C61 Malignant neoplasm of prostate: Secondary | ICD-10-CM | POA: Diagnosis not present

## 2022-08-20 DIAGNOSIS — K7460 Unspecified cirrhosis of liver: Secondary | ICD-10-CM | POA: Diagnosis not present

## 2022-08-20 DIAGNOSIS — F331 Major depressive disorder, recurrent, moderate: Secondary | ICD-10-CM | POA: Diagnosis not present

## 2022-09-12 ENCOUNTER — Other Ambulatory Visit: Payer: Self-pay | Admitting: Nurse Practitioner

## 2022-09-12 DIAGNOSIS — D376 Neoplasm of uncertain behavior of liver, gallbladder and bile ducts: Secondary | ICD-10-CM

## 2022-09-12 DIAGNOSIS — I8510 Secondary esophageal varices without bleeding: Secondary | ICD-10-CM | POA: Diagnosis not present

## 2022-09-12 DIAGNOSIS — K754 Autoimmune hepatitis: Secondary | ICD-10-CM | POA: Diagnosis not present

## 2022-09-12 DIAGNOSIS — K7469 Other cirrhosis of liver: Secondary | ICD-10-CM

## 2022-09-12 DIAGNOSIS — K766 Portal hypertension: Secondary | ICD-10-CM | POA: Diagnosis not present

## 2022-09-17 ENCOUNTER — Ambulatory Visit
Admission: RE | Admit: 2022-09-17 | Discharge: 2022-09-17 | Disposition: A | Discharge status: Home / Self Care | Payer: Medicare PPO | Source: Ambulatory Visit | Attending: Nurse Practitioner | Admitting: Nurse Practitioner

## 2022-09-17 DIAGNOSIS — K7460 Unspecified cirrhosis of liver: Secondary | ICD-10-CM | POA: Diagnosis not present

## 2022-09-17 DIAGNOSIS — D376 Neoplasm of uncertain behavior of liver, gallbladder and bile ducts: Secondary | ICD-10-CM

## 2022-09-17 DIAGNOSIS — K7469 Other cirrhosis of liver: Secondary | ICD-10-CM

## 2022-10-18 DIAGNOSIS — D485 Neoplasm of uncertain behavior of skin: Secondary | ICD-10-CM | POA: Diagnosis not present

## 2022-10-18 DIAGNOSIS — L821 Other seborrheic keratosis: Secondary | ICD-10-CM | POA: Diagnosis not present

## 2022-10-30 DIAGNOSIS — H2513 Age-related nuclear cataract, bilateral: Secondary | ICD-10-CM | POA: Diagnosis not present

## 2022-10-30 DIAGNOSIS — H43813 Vitreous degeneration, bilateral: Secondary | ICD-10-CM | POA: Diagnosis not present

## 2022-10-30 DIAGNOSIS — H52203 Unspecified astigmatism, bilateral: Secondary | ICD-10-CM | POA: Diagnosis not present

## 2022-11-27 DIAGNOSIS — C61 Malignant neoplasm of prostate: Secondary | ICD-10-CM | POA: Diagnosis not present

## 2022-11-27 DIAGNOSIS — Z8546 Personal history of malignant neoplasm of prostate: Secondary | ICD-10-CM | POA: Diagnosis not present

## 2023-02-12 DIAGNOSIS — M940 Chondrocostal junction syndrome [Tietze]: Secondary | ICD-10-CM | POA: Diagnosis not present

## 2023-02-12 DIAGNOSIS — R0789 Other chest pain: Secondary | ICD-10-CM | POA: Diagnosis not present

## 2023-02-20 DIAGNOSIS — C61 Malignant neoplasm of prostate: Secondary | ICD-10-CM | POA: Diagnosis not present

## 2023-02-20 DIAGNOSIS — K7460 Unspecified cirrhosis of liver: Secondary | ICD-10-CM | POA: Diagnosis not present

## 2023-02-20 DIAGNOSIS — K766 Portal hypertension: Secondary | ICD-10-CM | POA: Diagnosis not present

## 2023-02-20 DIAGNOSIS — F331 Major depressive disorder, recurrent, moderate: Secondary | ICD-10-CM | POA: Diagnosis not present

## 2023-02-20 DIAGNOSIS — E7800 Pure hypercholesterolemia, unspecified: Secondary | ICD-10-CM | POA: Diagnosis not present

## 2023-02-20 DIAGNOSIS — E042 Nontoxic multinodular goiter: Secondary | ICD-10-CM | POA: Diagnosis not present

## 2023-02-20 DIAGNOSIS — K754 Autoimmune hepatitis: Secondary | ICD-10-CM | POA: Diagnosis not present

## 2023-02-20 DIAGNOSIS — I700 Atherosclerosis of aorta: Secondary | ICD-10-CM | POA: Diagnosis not present

## 2023-02-20 DIAGNOSIS — K219 Gastro-esophageal reflux disease without esophagitis: Secondary | ICD-10-CM | POA: Diagnosis not present

## 2023-03-18 ENCOUNTER — Other Ambulatory Visit: Payer: Self-pay | Admitting: Nurse Practitioner

## 2023-03-18 DIAGNOSIS — K7469 Other cirrhosis of liver: Secondary | ICD-10-CM | POA: Diagnosis not present

## 2023-03-18 DIAGNOSIS — K754 Autoimmune hepatitis: Secondary | ICD-10-CM | POA: Diagnosis not present

## 2023-03-18 DIAGNOSIS — K766 Portal hypertension: Secondary | ICD-10-CM | POA: Diagnosis not present

## 2023-03-18 DIAGNOSIS — I8510 Secondary esophageal varices without bleeding: Secondary | ICD-10-CM | POA: Diagnosis not present

## 2023-03-18 DIAGNOSIS — E441 Mild protein-calorie malnutrition: Secondary | ICD-10-CM | POA: Diagnosis not present

## 2023-04-12 ENCOUNTER — Ambulatory Visit
Admission: RE | Admit: 2023-04-12 | Discharge: 2023-04-12 | Disposition: A | Discharge status: Home / Self Care | Payer: Medicare PPO | Source: Ambulatory Visit | Attending: Nurse Practitioner | Admitting: Nurse Practitioner

## 2023-04-12 DIAGNOSIS — K8020 Calculus of gallbladder without cholecystitis without obstruction: Secondary | ICD-10-CM | POA: Diagnosis not present

## 2023-04-12 DIAGNOSIS — K7460 Unspecified cirrhosis of liver: Secondary | ICD-10-CM | POA: Diagnosis not present

## 2023-04-12 DIAGNOSIS — K7469 Other cirrhosis of liver: Secondary | ICD-10-CM

## 2023-04-17 ENCOUNTER — Telehealth: Payer: Self-pay | Admitting: Gastroenterology

## 2023-04-17 MED ORDER — PROPRANOLOL HCL 20 MG PO TABS: 20.0000 mg | ORAL_TABLET | Freq: Two times a day (BID) | ORAL | 0 refills | Status: DC

## 2023-04-17 NOTE — Telephone Encounter (Signed)
Yes okay to refill it for him - can give 3 months, can give further refills once he sees [place] in June

## 2023-04-17 NOTE — Telephone Encounter (Signed)
90 day Refill of propranolol sent to pharmacy

## 2023-04-17 NOTE — Telephone Encounter (Signed)
Patient called stated that he needs a refill for the medication propranolol. He schedule for a follow up visit on 6/13. States he is going out of town on 4/20. Please advise.  Thank you

## 2023-04-18 NOTE — Telephone Encounter (Signed)
Called and spoke to patient.  He was able to get script from [facility] transferred to [facility].  I have Updated his chart to delete [facility] as he indicates he does not use them anymore. Confirmed appointment in June.

## 2023-04-18 NOTE — Telephone Encounter (Signed)
Patient called stating he called his pharmacy [facility] today 4/18 and the pharmacy said it was still not sent in. Patient is leaving tomorrow for trip and is wondering would would happen if he stops taking it. Please advise.

## 2023-05-01 DIAGNOSIS — M545 Low back pain, unspecified: Secondary | ICD-10-CM | POA: Diagnosis not present

## 2023-05-21 DIAGNOSIS — C61 Malignant neoplasm of prostate: Secondary | ICD-10-CM | POA: Diagnosis not present

## 2023-05-28 DIAGNOSIS — Z8546 Personal history of malignant neoplasm of prostate: Secondary | ICD-10-CM | POA: Diagnosis not present

## 2023-05-28 DIAGNOSIS — N5201 Erectile dysfunction due to arterial insufficiency: Secondary | ICD-10-CM | POA: Diagnosis not present

## 2023-06-13 ENCOUNTER — Ambulatory Visit: Payer: Medicare PPO | Admitting: Physician Assistant

## 2023-06-13 ENCOUNTER — Encounter: Payer: Self-pay | Admitting: Physician Assistant

## 2023-06-13 VITALS — BP 130/82 | HR 63 | Ht 71.0 in | Wt 168.0 lb

## 2023-06-13 DIAGNOSIS — I8510 Secondary esophageal varices without bleeding: Secondary | ICD-10-CM | POA: Diagnosis not present

## 2023-06-13 DIAGNOSIS — K754 Autoimmune hepatitis: Secondary | ICD-10-CM

## 2023-06-13 DIAGNOSIS — I8500 Esophageal varices without bleeding: Secondary | ICD-10-CM

## 2023-06-13 MED ORDER — PROPRANOLOL HCL 20 MG PO TABS: 20.0000 mg | ORAL_TABLET | Freq: Every day | ORAL | 3 refills | Status: DC

## 2023-06-13 NOTE — Patient Instructions (Addendum)
We have sent the following medications to your pharmacy for you to pick up at your convenience: Propranolol 20 mg daily.   _______________________________________________________  If your blood pressure at your visit was 140/90 or greater, please contact your primary care physician to follow up on this.  _______________________________________________________  If you are age 68 or older, your body mass index should be between 23-30. Your Body mass index is 23.43 kg/m. If this is out of the aforementioned range listed, please consider follow up with your Primary Care Provider.  If you are age 61 or younger, your body mass index should be between 19-25. Your Body mass index is 23.43 kg/m. If this is out of the aformentioned range listed, please consider follow up with your Primary Care Provider.   ________________________________________________________  The [place] providers would like to encourage you to use [facility] to communicate with providers for non-urgent requests or questions.  Due to long hold times on the telephone, sending your provider a message by [facility] may be a faster and more efficient way to get a response.  Please allow 48 business hours for a response.  Please remember that this is for non-urgent requests.  _______________________________________________________

## 2023-06-13 NOTE — Progress Notes (Signed)
Agree with assessment and plan as outlined. As long as he is on propranolol that he does not need a surveillance endoscopy, as he is being treated for varices. Will await his colonoscopy next year, he will continue to see Hepatology

## 2023-06-13 NOTE — Progress Notes (Signed)
Chief Complaint: Refill propranolol  HPI:    [name] is a 67 year old Caucasian male with a past medical history of autoimmune hepatitis, who now follows with [facility], known to Dr. Adela Lank, who presents to clinic today for refill of his propranolol.    02/23/2019 colonoscopy for history of adenomatous polyps with 4 polyps throughout the colon, diverticulosis and internal hemorrhoids.  Pathology showed sessile serrated polyps and repeat recommended in 5 years.    05/13/2020 EGD with Dr. Adela Lank with 2 columns of esophageal varices, 6 cm hiatal hernia and a large gastric polyp as well as gastritis and nodular mucosa in the duodenal bulb.  Patient then had a workup for portal hypertension and eventually was discovered to have autoimmune hepatitis.    03/18/2023 patient saw Annamarie Major at the [facility].  At that time noted to be very stable on his Propranolol 20 mg daily and Azathioprine 50 mg daily.    Today, patient tells me he is just here because he was told he had to come in order to get refills of his propranolol.  He is currently taking 20 mg daily.  Tells me he was on a higher dose but was decreased and he cannot remember why.  (Per review of chart it looks like he had some trouble with dizziness at a higher dose).  He is now following with the liver clinic in regards to his autoimmune hepatitis.  No acute GI complaints or concerns today.    Denies fever, chills, weight loss or blood in his stool.  Past Medical History:  Diagnosis Date   Autoimmune hepatitis (HCC)    Cirrhosis (HCC)    Colon polyps    Depression    situational   Gallstones    Gastric polyps    GERD (gastroesophageal reflux disease)    History of hiatal hernia    Hyperlipemia    Pericarditis 2017   resolved no problem since per pt   Pneumonia 2012   Prostate cancer [facility])    Wears glasses     Past Surgical History:  Procedure Laterality Date   COLONOSCOPY  12/21/2015    ESOPHAGOGASTRODUODENOSCOPY (EGD) WITH PROPOFOL N/A 08/22/2020   Procedure: ESOPHAGOGASTRODUODENOSCOPY (EGD) WITH PROPOFOL;  Surgeon: Lemar Lofty., MD;  Location: [facility] ENDOSCOPY;  Service: Gastroenterology;  Laterality: N/A;   HEMOSTASIS CLIP PLACEMENT  08/22/2020   Procedure: HEMOSTASIS CLIP PLACEMENT;  Surgeon: Lemar Lofty., MD;  Location: [facility] ENDOSCOPY;  Service: Gastroenterology;;   HEMOSTASIS CONTROL  08/22/2020   Procedure: HEMOSTASIS CONTROL;  Surgeon: Lemar Lofty., MD;  Location: [facility] ENDOSCOPY;  Service: Gastroenterology;;   IR TRANSCATHETER BX  06/13/2020   POLYPECTOMY  2016   POLYPECTOMY  08/22/2020   Procedure: POLYPECTOMY;  Surgeon: Lemar Lofty., MD;  Location: [facility] ENDOSCOPY;  Service: Gastroenterology;;   RADIOACTIVE SEED IMPLANT N/A 06/01/2021   Procedure: RADIOACTIVE SEED IMPLANT/BRACHYTHERAPY IMPLANT;  Surgeon: Crist Fat, MD;  Location: [facility];  Service: Urology;  Laterality: N/A;  56 SEEDS IMPLANTED   SHOULDER ARTHROSCOPY  2012   left, bone spur   SPACE OAR INSTILLATION N/A 06/01/2021   Procedure: SPACE OAR INSTILLATION;  Surgeon: Crist Fat, MD;  Location: [facility];  Service: Urology;  Laterality: N/A;   SUBMUCOSAL INJECTION  08/22/2020   Procedure: SUBMUCOSAL INJECTION;  Surgeon: Meridee Score Netty Starring., MD;  Location: [facility] ENDOSCOPY;  Service: Gastroenterology;;   TONSILLECTOMY AND ADENOIDECTOMY  age 60   adenoids removed also  UPPER ESOPHAGEAL ENDOSCOPIC ULTRASOUND (EUS)  08/22/2020   Procedure: UPPER ESOPHAGEAL ENDOSCOPIC ULTRASOUND (EUS);  Surgeon: Lemar Lofty., MD;  Location: [facility] ENDOSCOPY;  Service: Gastroenterology;;   VASECTOMY  35 yrs ago ( 05-26-2021)   wisdom teeth extractions  age 31    Current Outpatient Medications  Medication Sig Dispense Refill   ALPRAZolam (XANAX) 0.5 MG tablet Take 0.5 mg by mouth at bedtime as needed for anxiety or sleep.      azaTHIOprine  (IMURAN) 50 MG tablet Take 50 mg by mouth daily.     esomeprazole (NEXIUM) 40 MG capsule Take 1 capsule (40 mg total) by mouth 2 (two) times daily before a meal. 60 capsule 3   propranolol (INDERAL) 20 MG tablet Take 1 tablet (20 mg total) by mouth 2 (two) times daily. Please keep your June appointment for any further refills. Thank you 180 tablet 0   tamsulosin (FLOMAX) 0.4 MG CAPS capsule Take 1 capsule (0.4 mg total) by mouth daily. 30 capsule 5   traMADol (ULTRAM) 50 MG tablet Take 1-2 tablets (50-100 mg total) by mouth every 6 (six) hours as needed for moderate pain. 10 tablet 0   diazepam (VALIUM) 5 MG tablet Take 1 tablet (5 mg total) by mouth 1 hour prior to MRI, take another tablet 30 minutes prior to MRI if needed. (Patient not taking: Reported on 11/02/2021) 2 tablet 0   No current facility-administered medications for this visit.    Allergies as of 06/13/2023 - Review Complete 06/13/2023  Allergen Reaction Noted   Atorvastatin  02/16/2021   Cephalexin  02/16/2021   Meloxicam  02/16/2021   Other Other (See Comments) 02/16/2021   Oxycodone-acetaminophen  02/16/2021   Pravastatin  02/16/2021   Sulfa antibiotics Rash 11/30/2015    Family History  Problem Relation Age of Onset   Colon cancer Paternal Grandfather        mid 70's   Heart disease Father    Lung cancer Mother    Colon polyps Neg Hx    Rectal cancer Neg Hx    Stomach cancer Neg Hx    Esophageal cancer Neg Hx    Inflammatory bowel disease Neg Hx    Liver disease Neg Hx    Pancreatic cancer Neg Hx     Social History   Socioeconomic History   Marital status: Married    Spouse name: Samara Deist   Number of children: 4   Years of education: Not on file   Highest education level: Not on file  Occupational History   Not on file  Tobacco Use   Smoking status: Some Days    Types: Cigars   Smokeless tobacco: Never  Vaping Use   Vaping Use: Never used  Substance and Sexual Activity   Alcohol use: Not Currently     Alcohol/week: 15.0 standard drinks of alcohol    Types: 8 Cans of beer, 7 Shots of liquor per week   Drug use: No   Sexual activity: Not Currently  Other Topics Concern   Not on file  Social History Narrative   Not on file   Social Determinants of Health   Financial Resource Strain: Low Risk  (11/02/2021)   Overall Financial Resource Strain (CARDIA)    Difficulty of Paying Living Expenses: Not hard at all  Food Insecurity: No Food Insecurity (11/02/2021)   Hunger Vital Sign    Worried About Running Out of Food in the Last Year: Never true    Ran Out of Food in  the Last Year: Never true  Transportation Needs: No Transportation Needs (11/02/2021)   PRAPARE - Administrator, Civil Service (Medical): No    Lack of Transportation (Non-Medical): No  Physical Activity: Sufficiently Active (11/02/2021)   Exercise Vital Sign    Days of Exercise per Week: 4 days    Minutes of Exercise per Session: 50 min  Stress: No Stress Concern Present (11/02/2021)   [employer] of Occupational Health - Occupational Stress Questionnaire    Feeling of Stress : Not at all  Social Connections: Moderately Integrated (11/02/2021)   Social Connection and Isolation Panel [NHANES]    Frequency of Communication with Friends and Family: More than three times a week    Frequency of Social Gatherings with Friends and Family: More than three times a week    Attends Religious Services: 1 to 4 times per year    Active Member of [employer] or Organizations: No    Attends Banker Meetings: Never    Marital Status: Married  Catering manager Violence: Not At Risk (11/02/2021)   Humiliation, Afraid, Rape, and Kick questionnaire    Fear of Current or Ex-Partner: No    Emotionally Abused: No    Physically Abused: No    Sexually Abused: No    Review of Systems:    Constitutional: No weight loss, fever or chills Skin: No rash Cardiovascular: No chest pain Respiratory: No SOB   Gastrointestinal: See HPI and otherwise negative Genitourinary: No dysuria Neurological: No headache, dizziness or syncope Musculoskeletal: No new muscle or joint pain Hematologic: No bleeding  Psychiatric: No history of depression or anxiety   Physical Exam:  Vital signs: BP 130/82   Pulse 63   Ht 5\' 11"  (1.803 m)   Wt 168 lb (76.2 kg)   BMI 23.43 kg/m   Constitutional:   Pleasant Caucasian male appears to be in NAD, Well developed, Well nourished, alert and cooperative Head:  Normocephalic and atraumatic. Eyes:   PEERL, EOMI. No icterus. Conjunctiva pink. Ears:  Normal auditory acuity. Neck:  Supple Throat: Oral cavity and pharynx without inflammation, swelling or lesion.  Respiratory: Respirations even and unlabored. Lungs clear to auscultation bilaterally.   No wheezes, crackles, or rhonchi.  Cardiovascular: Normal S1, S2. No MRG. Regular rate and rhythm. No peripheral edema, cyanosis or pallor.  Gastrointestinal:  Soft, nondistended, nontender. No rebound or guarding. Normal bowel sounds. No appreciable masses or hepatomegaly. Rectal:  Not performed.  Msk:  Symmetrical without gross deformities. Without edema, no deformity or joint abnormality.  Neurologic:  Alert and  oriented x4;  grossly normal neurologically.  Skin:   Dry and intact without significant lesions or rashes. Psychiatric:  Demonstrates good judgement and reason without abnormal affect or behaviors.  RELEVANT LABS AND IMAGING: CBC    Component Value Date/Time   WBC 5.4 05/30/2021 1029   RBC 4.79 05/30/2021 1029   HGB 16.1 05/30/2021 1029   HCT 45.9 05/30/2021 1029   PLT 200 05/30/2021 1029   MCV 95.8 05/30/2021 1029   MCH 33.6 05/30/2021 1029   MCHC 35.1 05/30/2021 1029   RDW 13.6 05/30/2021 1029   LYMPHSABS 2.1 05/18/2020 0925   MONOABS 0.8 05/18/2020 0925   EOSABS 0.2 05/18/2020 0925   BASOSABS 0.0 05/18/2020 0925    CMP     Component Value Date/Time   NA 142 09/21/2021 1122   K 4.2  09/21/2021 1122   CL 106 09/21/2021 1122   CO2 30 09/21/2021 1122   GLUCOSE  81 09/21/2021 1122   BUN 13 09/21/2021 1122   CREATININE 0.73 09/21/2021 1122   CALCIUM 9.4 09/21/2021 1122   PROT 7.1 09/21/2021 1122   ALBUMIN 3.8 09/21/2021 1122   AST 50 (H) 09/21/2021 1122   ALT 24 09/21/2021 1122   ALKPHOS 103 09/21/2021 1122   BILITOT 1.3 (H) 09/21/2021 1122   GFRNONAA >60 05/30/2021 1029    Assessment: 1.  Autoimmune hepatitis: Maintained on Azathioprine and Propranolol 20 mg, follows with the [facility] as of March of this year, stable at their last office visit (see that note), now needing a refill of his Propranolol 2.  History of esophageal varices: Last EGD in May 2021, things appear stable requesting recent labs, may need to consider repeat for surveillance 3.  History of sessile serrated polyps: Repeat recommended in February 2025  Plan: 1.  Explained to patient that I will discuss when patient is due for EGD surveillance for his varices with Dr. Adela Lank.  It may be this year or we could possibly postpone until next year with his recommended colonoscopy. 2.  Refilled Propranolol 20 mg daily #90 with 3 refills.  Explained that Dawn at the [facility] could refill this for him in the future as it is for his autoimmune hepatitis/varices 3.  Patient to follow in clinic per recommendations from Dr. Adela Lank or in a year for his colonoscopy/EGD.  Hyacinth Meeker, PA-C [facility] 06/13/2023, 10:59 AM  Cc: Blair Heys, MD

## 2023-08-22 DIAGNOSIS — F331 Major depressive disorder, recurrent, moderate: Secondary | ICD-10-CM | POA: Diagnosis not present

## 2023-08-22 DIAGNOSIS — I700 Atherosclerosis of aorta: Secondary | ICD-10-CM | POA: Diagnosis not present

## 2023-08-22 DIAGNOSIS — K766 Portal hypertension: Secondary | ICD-10-CM | POA: Diagnosis not present

## 2023-08-22 DIAGNOSIS — C61 Malignant neoplasm of prostate: Secondary | ICD-10-CM | POA: Diagnosis not present

## 2023-08-22 DIAGNOSIS — Z0000 Encounter for general adult medical examination without abnormal findings: Secondary | ICD-10-CM | POA: Diagnosis not present

## 2023-08-22 DIAGNOSIS — E042 Nontoxic multinodular goiter: Secondary | ICD-10-CM | POA: Diagnosis not present

## 2023-08-22 DIAGNOSIS — E7800 Pure hypercholesterolemia, unspecified: Secondary | ICD-10-CM | POA: Diagnosis not present

## 2023-08-22 DIAGNOSIS — K7460 Unspecified cirrhosis of liver: Secondary | ICD-10-CM | POA: Diagnosis not present

## 2023-08-22 DIAGNOSIS — K754 Autoimmune hepatitis: Secondary | ICD-10-CM | POA: Diagnosis not present

## 2023-09-18 ENCOUNTER — Other Ambulatory Visit: Payer: Self-pay | Admitting: Nurse Practitioner

## 2023-09-18 DIAGNOSIS — D376 Neoplasm of uncertain behavior of liver, gallbladder and bile ducts: Secondary | ICD-10-CM

## 2023-09-18 DIAGNOSIS — I8510 Secondary esophageal varices without bleeding: Secondary | ICD-10-CM | POA: Diagnosis not present

## 2023-09-18 DIAGNOSIS — K766 Portal hypertension: Secondary | ICD-10-CM | POA: Diagnosis not present

## 2023-09-18 DIAGNOSIS — K754 Autoimmune hepatitis: Secondary | ICD-10-CM

## 2023-09-18 DIAGNOSIS — K7469 Other cirrhosis of liver: Secondary | ICD-10-CM | POA: Diagnosis not present

## 2023-09-25 ENCOUNTER — Ambulatory Visit
Admission: RE | Admit: 2023-09-25 | Discharge: 2023-09-25 | Disposition: A | Discharge status: Home / Self Care | Payer: Medicare PPO | Source: Ambulatory Visit | Attending: Nurse Practitioner | Admitting: Nurse Practitioner

## 2023-09-25 DIAGNOSIS — K754 Autoimmune hepatitis: Secondary | ICD-10-CM | POA: Diagnosis not present

## 2023-09-25 DIAGNOSIS — I8510 Secondary esophageal varices without bleeding: Secondary | ICD-10-CM | POA: Diagnosis not present

## 2023-09-25 DIAGNOSIS — D376 Neoplasm of uncertain behavior of liver, gallbladder and bile ducts: Secondary | ICD-10-CM

## 2023-09-25 DIAGNOSIS — K7469 Other cirrhosis of liver: Secondary | ICD-10-CM

## 2023-09-25 DIAGNOSIS — K766 Portal hypertension: Secondary | ICD-10-CM | POA: Diagnosis not present

## 2023-10-29 DIAGNOSIS — F331 Major depressive disorder, recurrent, moderate: Secondary | ICD-10-CM | POA: Diagnosis not present

## 2023-10-29 DIAGNOSIS — F411 Generalized anxiety disorder: Secondary | ICD-10-CM | POA: Diagnosis not present

## 2023-11-14 DIAGNOSIS — C61 Malignant neoplasm of prostate: Secondary | ICD-10-CM | POA: Diagnosis not present

## 2023-11-14 DIAGNOSIS — Z8546 Personal history of malignant neoplasm of prostate: Secondary | ICD-10-CM | POA: Diagnosis not present

## 2023-11-21 DIAGNOSIS — N5201 Erectile dysfunction due to arterial insufficiency: Secondary | ICD-10-CM | POA: Diagnosis not present

## 2023-11-21 DIAGNOSIS — Z8546 Personal history of malignant neoplasm of prostate: Secondary | ICD-10-CM | POA: Diagnosis not present

## 2024-02-11 ENCOUNTER — Encounter: Payer: Self-pay | Admitting: Gastroenterology

## 2024-03-18 ENCOUNTER — Other Ambulatory Visit: Payer: Self-pay | Admitting: Nurse Practitioner

## 2024-03-18 DIAGNOSIS — D376 Neoplasm of uncertain behavior of liver, gallbladder and bile ducts: Secondary | ICD-10-CM

## 2024-03-18 DIAGNOSIS — K7469 Other cirrhosis of liver: Secondary | ICD-10-CM

## 2024-03-18 DIAGNOSIS — K754 Autoimmune hepatitis: Secondary | ICD-10-CM

## 2024-03-26 ENCOUNTER — Ambulatory Visit
Admission: RE | Admit: 2024-03-26 | Discharge: 2024-03-26 | Disposition: A | Discharge status: Home / Self Care | Source: Ambulatory Visit | Attending: Nurse Practitioner

## 2024-03-26 DIAGNOSIS — K7469 Other cirrhosis of liver: Secondary | ICD-10-CM

## 2024-03-26 DIAGNOSIS — D376 Neoplasm of uncertain behavior of liver, gallbladder and bile ducts: Secondary | ICD-10-CM

## 2024-03-26 DIAGNOSIS — K754 Autoimmune hepatitis: Secondary | ICD-10-CM

## 2024-08-04 ENCOUNTER — Other Ambulatory Visit: Payer: Self-pay | Admitting: Physician Assistant

## 2024-08-20 DIAGNOSIS — K7460 Unspecified cirrhosis of liver: Secondary | ICD-10-CM | POA: Diagnosis not present

## 2024-08-20 DIAGNOSIS — F331 Major depressive disorder, recurrent, moderate: Secondary | ICD-10-CM | POA: Diagnosis not present

## 2024-08-20 DIAGNOSIS — E782 Mixed hyperlipidemia: Secondary | ICD-10-CM | POA: Diagnosis not present

## 2024-08-20 DIAGNOSIS — F411 Generalized anxiety disorder: Secondary | ICD-10-CM | POA: Diagnosis not present

## 2024-08-20 DIAGNOSIS — K754 Autoimmune hepatitis: Secondary | ICD-10-CM | POA: Diagnosis not present

## 2024-08-20 DIAGNOSIS — K219 Gastro-esophageal reflux disease without esophagitis: Secondary | ICD-10-CM | POA: Diagnosis not present

## 2024-08-20 DIAGNOSIS — F4320 Adjustment disorder, unspecified: Secondary | ICD-10-CM | POA: Diagnosis not present

## 2024-08-20 DIAGNOSIS — Z8546 Personal history of malignant neoplasm of prostate: Secondary | ICD-10-CM | POA: Diagnosis not present

## 2024-08-20 DIAGNOSIS — Z0000 Encounter for general adult medical examination without abnormal findings: Secondary | ICD-10-CM | POA: Diagnosis not present

## 2024-08-20 DIAGNOSIS — K766 Portal hypertension: Secondary | ICD-10-CM | POA: Diagnosis not present

## 2024-08-20 DIAGNOSIS — Z136 Encounter for screening for cardiovascular disorders: Secondary | ICD-10-CM | POA: Diagnosis not present

## 2024-09-22 ENCOUNTER — Other Ambulatory Visit: Payer: Self-pay | Admitting: Nurse Practitioner

## 2024-09-22 DIAGNOSIS — K754 Autoimmune hepatitis: Secondary | ICD-10-CM

## 2024-09-22 DIAGNOSIS — K7469 Other cirrhosis of liver: Secondary | ICD-10-CM

## 2024-09-22 DIAGNOSIS — K766 Portal hypertension: Secondary | ICD-10-CM | POA: Diagnosis not present

## 2024-09-22 DIAGNOSIS — I8510 Secondary esophageal varices without bleeding: Secondary | ICD-10-CM | POA: Diagnosis not present

## 2024-09-24 ENCOUNTER — Other Ambulatory Visit

## 2024-09-30 ENCOUNTER — Ambulatory Visit
Admission: RE | Admit: 2024-09-30 | Discharge: 2024-09-30 | Disposition: A | Discharge status: Home / Self Care | Source: Ambulatory Visit | Attending: Nurse Practitioner | Admitting: Nurse Practitioner

## 2024-09-30 DIAGNOSIS — K7469 Other cirrhosis of liver: Secondary | ICD-10-CM

## 2024-09-30 DIAGNOSIS — K7460 Unspecified cirrhosis of liver: Secondary | ICD-10-CM | POA: Diagnosis not present

## 2024-09-30 DIAGNOSIS — K754 Autoimmune hepatitis: Secondary | ICD-10-CM

## 2024-09-30 DIAGNOSIS — K8020 Calculus of gallbladder without cholecystitis without obstruction: Secondary | ICD-10-CM | POA: Diagnosis not present

## 2024-11-23 ENCOUNTER — Other Ambulatory Visit: Payer: Self-pay | Admitting: Physician Assistant
